# Patient Record
Sex: Male | Born: 1940 | Race: White | Hispanic: No | Marital: Single | Smoking: Never smoker
Health system: Southern US, Community
[De-identification: ages and names within clinical notes are randomized; demographics above are authoritative.]

## PROBLEM LIST (undated history)

## (undated) DIAGNOSIS — C61 Malignant neoplasm of prostate: Secondary | ICD-10-CM

## (undated) DIAGNOSIS — E785 Hyperlipidemia, unspecified: Secondary | ICD-10-CM

## (undated) DIAGNOSIS — F329 Major depressive disorder, single episode, unspecified: Secondary | ICD-10-CM

## (undated) DIAGNOSIS — C801 Malignant (primary) neoplasm, unspecified: Secondary | ICD-10-CM

## (undated) DIAGNOSIS — M1990 Unspecified osteoarthritis, unspecified site: Secondary | ICD-10-CM

## (undated) DIAGNOSIS — K4090 Unilateral inguinal hernia, without obstruction or gangrene, not specified as recurrent: Secondary | ICD-10-CM

## (undated) DIAGNOSIS — E119 Type 2 diabetes mellitus without complications: Secondary | ICD-10-CM

## (undated) DIAGNOSIS — C439 Malignant melanoma of skin, unspecified: Secondary | ICD-10-CM

## (undated) DIAGNOSIS — I10 Essential (primary) hypertension: Secondary | ICD-10-CM

## (undated) DIAGNOSIS — Z974 Presence of external hearing-aid: Secondary | ICD-10-CM

## (undated) DIAGNOSIS — F32A Depression, unspecified: Secondary | ICD-10-CM

## (undated) DIAGNOSIS — R001 Bradycardia, unspecified: Secondary | ICD-10-CM

## (undated) DIAGNOSIS — S68111A Complete traumatic metacarpophalangeal amputation of left index finger, initial encounter: Secondary | ICD-10-CM

## (undated) DIAGNOSIS — I493 Ventricular premature depolarization: Secondary | ICD-10-CM

## (undated) DIAGNOSIS — Q742 Other congenital malformations of lower limb(s), including pelvic girdle: Secondary | ICD-10-CM

## (undated) HISTORY — PX: OTHER SURGICAL HISTORY: SHX169

## (undated) HISTORY — DX: Malignant (primary) neoplasm, unspecified: C80.1

## (undated) HISTORY — PX: TOTAL HIP ARTHROPLASTY: SHX124

## (undated) HISTORY — DX: Hyperlipidemia, unspecified: E78.5

## (undated) HISTORY — DX: Bradycardia, unspecified: R00.1

## (undated) HISTORY — DX: Ventricular premature depolarization: I49.3

## (undated) HISTORY — DX: Type 2 diabetes mellitus without complications: E11.9

## (undated) HISTORY — PX: INGUINAL HERNIA REPAIR: SUR1180

---

## 2000-05-31 ENCOUNTER — Ambulatory Visit (HOSPITAL_COMMUNITY): Admission: RE | Admit: 2000-05-31 | Discharge: 2000-05-31 | Payer: Self-pay | Admitting: Dermatology

## 2002-06-16 ENCOUNTER — Ambulatory Visit (HOSPITAL_COMMUNITY): Admission: RE | Admit: 2002-06-16 | Discharge: 2002-06-17 | Payer: Self-pay | Admitting: Pediatrics

## 2003-04-27 ENCOUNTER — Ambulatory Visit (HOSPITAL_COMMUNITY): Admission: RE | Admit: 2003-04-27 | Discharge: 2003-04-27 | Payer: Self-pay | Admitting: Gastroenterology

## 2003-09-09 ENCOUNTER — Encounter: Payer: Self-pay | Admitting: General Surgery

## 2003-09-09 ENCOUNTER — Ambulatory Visit (HOSPITAL_COMMUNITY): Admission: RE | Admit: 2003-09-09 | Discharge: 2003-09-09 | Payer: Self-pay | Admitting: General Surgery

## 2003-09-28 ENCOUNTER — Ambulatory Visit (HOSPITAL_BASED_OUTPATIENT_CLINIC_OR_DEPARTMENT_OTHER): Admission: RE | Admit: 2003-09-28 | Discharge: 2003-09-28 | Payer: Self-pay | Admitting: Urology

## 2003-09-28 ENCOUNTER — Encounter (INDEPENDENT_AMBULATORY_CARE_PROVIDER_SITE_OTHER): Payer: Self-pay

## 2003-09-28 ENCOUNTER — Ambulatory Visit (HOSPITAL_COMMUNITY): Admission: RE | Admit: 2003-09-28 | Discharge: 2003-09-28 | Payer: Self-pay | Admitting: Urology

## 2003-10-11 ENCOUNTER — Ambulatory Visit (HOSPITAL_COMMUNITY): Admission: RE | Admit: 2003-10-11 | Discharge: 2003-10-11 | Payer: Self-pay | Admitting: Urology

## 2003-10-15 ENCOUNTER — Observation Stay (HOSPITAL_COMMUNITY): Admission: RE | Admit: 2003-10-15 | Discharge: 2003-10-16 | Payer: Self-pay | Admitting: General Surgery

## 2003-11-08 ENCOUNTER — Ambulatory Visit: Admission: RE | Admit: 2003-11-08 | Discharge: 2003-11-29 | Payer: Self-pay | Admitting: Radiation Oncology

## 2003-12-04 HISTORY — PX: PROSTATECTOMY: SHX69

## 2004-01-25 ENCOUNTER — Encounter (INDEPENDENT_AMBULATORY_CARE_PROVIDER_SITE_OTHER): Payer: Self-pay | Admitting: *Deleted

## 2004-01-25 ENCOUNTER — Inpatient Hospital Stay (HOSPITAL_COMMUNITY): Admission: RE | Admit: 2004-01-25 | Discharge: 2004-01-29 | Payer: Self-pay | Admitting: Urology

## 2004-12-07 ENCOUNTER — Ambulatory Visit (HOSPITAL_COMMUNITY): Admission: RE | Admit: 2004-12-07 | Discharge: 2004-12-07 | Payer: Self-pay | Admitting: Family Medicine

## 2004-12-18 ENCOUNTER — Ambulatory Visit (HOSPITAL_COMMUNITY): Admission: RE | Admit: 2004-12-18 | Discharge: 2004-12-18 | Payer: Self-pay | Admitting: Family Medicine

## 2005-10-31 ENCOUNTER — Ambulatory Visit (HOSPITAL_COMMUNITY): Admission: RE | Admit: 2005-10-31 | Discharge: 2005-10-31 | Payer: Self-pay | Admitting: General Surgery

## 2005-12-13 ENCOUNTER — Ambulatory Visit: Admission: RE | Admit: 2005-12-13 | Discharge: 2006-01-10 | Payer: Self-pay | Admitting: Radiation Oncology

## 2006-01-18 ENCOUNTER — Ambulatory Visit (HOSPITAL_COMMUNITY): Admission: RE | Admit: 2006-01-18 | Discharge: 2006-01-18 | Payer: Self-pay | Admitting: Family Medicine

## 2006-08-21 ENCOUNTER — Encounter: Payer: Self-pay | Admitting: Vascular Surgery

## 2006-08-21 ENCOUNTER — Ambulatory Visit (HOSPITAL_COMMUNITY): Admission: RE | Admit: 2006-08-21 | Discharge: 2006-08-21 | Payer: Self-pay | Admitting: Family Medicine

## 2007-09-08 ENCOUNTER — Inpatient Hospital Stay (HOSPITAL_COMMUNITY): Admission: RE | Admit: 2007-09-08 | Discharge: 2007-09-11 | Payer: Self-pay | Admitting: Orthopedic Surgery

## 2007-11-03 ENCOUNTER — Ambulatory Visit (HOSPITAL_COMMUNITY): Admission: RE | Admit: 2007-11-03 | Discharge: 2007-11-04 | Payer: Self-pay | Admitting: General Surgery

## 2008-01-20 ENCOUNTER — Ambulatory Visit (HOSPITAL_COMMUNITY): Admission: RE | Admit: 2008-01-20 | Discharge: 2008-01-20 | Payer: Self-pay | Admitting: Surgery

## 2008-06-23 ENCOUNTER — Inpatient Hospital Stay (HOSPITAL_COMMUNITY): Admission: RE | Admit: 2008-06-23 | Discharge: 2008-06-28 | Payer: Self-pay | Admitting: Orthopedic Surgery

## 2010-12-23 ENCOUNTER — Encounter: Payer: Self-pay | Admitting: Urology

## 2011-04-17 NOTE — Op Note (Signed)
NAME:  Jay Bailey, FERRELL NO.:  [id number]   MEDICAL RECORD NO.:  [id number]          PATIENT TYPE:  INP   LOCATION:  0006                         FACILITY:  [facility]   PHYSICIAN:  Ollen Gross, M.D.    DATE OF BIRTH:  1941/10/03   DATE OF PROCEDURE:  09/08/2007  DATE OF DISCHARGE:                               OPERATIVE REPORT   PREOPERATIVE DIAGNOSIS:  Osteoarthritis right hip.   POSTOPERATIVE DIAGNOSIS:  Osteoarthritis right hip.   PROCEDURE:  Right total hip arthroplasty.   SURGEON:  Ollen Gross, M.D.   ASSISTANT:  Avel Peace PA-C.   ANESTHESIA:  General.   ESTIMATED BLOOD LOSS:  300 mL.   DRAINS:  None.   COMPLICATION:  None.   CONDITION:  Stable to recovery.   CLINICAL NOTE:  The patient is a 70 year old male with severe end-stage  arthritis of both hips with progressively worsening pain and  dysfunction.  He has had a difficult time even ambulating now.  Right  hip is more painful than the left.  He presents now for right total hip  arthroplasty.   PROCEDURE IN DETAIL:  After the successful administration of general  anesthetic, the patient placed in left lateral decubitus position with  the right side up and held with the hip positioner.  Right lower  extremity is isolated from perineum with plastic drapes and prepped and  draped in the usual sterile fashion.  Short posterolateral incision was  made with a 10 blade through subcutaneous tissue to the level of the  fascia lata which is incised in line with the skin incision.  Sciatic  nerve is palpated and protected and the short external rotator is  isolated off the femur.  Capsulectomy is performed and the hip is  dislocated.  The center of the femoral head is marked and a trial  prosthesis placed such that the center of the trial head corresponds to  the center of his native femoral head.  Osteotomy lines marked on the  femoral neck and osteotomy made with an oscillating saw.  Femoral head  is removed and the femur retracted anteriorly to gain acetabular  exposure.  He had a massive rim of osteophyte around the entire  acetabulum, which is about an inch.  The osteophyte is removed and  retractor is placed.  Reaming started at 47 mm coursing increments of 2  up to 53 mm and a 54 mm pinnacle acetabular shell is placed in anatomic  position and transfixed with two dome screws.  The apex hole eliminator  and the 36 mm neutral Ultramet metal liner is placed.   On the femoral side, we prepared with the canal finder and irrigation.  Axial reaming is performed up to 13.5 mm, proximal reaming to a 8F and  the sleeve machine to extra-extra large.  With this sleeve, it sat proud  a few millimeters but we did this because the large sunk below the  resection level and I was concerned about it subsiding.  With the sleeve  sitting proud a few millimeters, we had a tighter fit that would  not  subside.  The 18 x 13 the stem with 36 +8 neck is then placed.  I  anteverted about 10 degrees beyond her native anatomy.  The trial 36 +0  head is placed and introduced a great stability.  There is full  extension, full external rotation, 70 degrees of flexion, 40 degrees  adduction, 90 degrees internal rotation 90 degrees of flexion and 70  degrees of internal rotation.  I placed the right leg on top of the left  and felt as though leg lengths were just about equal.  Wound is then  copiously irrigated saline solution and the short external rotator is  reattached to the femur through drill holes.  Fascia lata is closed with  interrupted #1 Vicryl, subcu closed with #1 and 2-0 Vicryl subcuticular  running 4-0 Monocryl.  The incision is cleaned and dried and Steri-  Strips and a bulky sterile dressing applied.  He is then awakened and  transferred to recovery in stable condition.      Ollen Gross, M.D.  Electronically Signed     FA/MEDQ  D:  09/08/2007  T:  09/08/2007  Job:  [id number]

## 2011-04-17 NOTE — Op Note (Signed)
NAME:  Jay Bailey, Jay Bailey NO.:  [id number]   MEDICAL RECORD NO.:  [id number]          PATIENT TYPE:  OIB   LOCATION:  2550                         FACILITY:  [facility]   PHYSICIAN:  Leonie Man, M.D.   DATE OF BIRTH:  1941-03-15   DATE OF PROCEDURE:  11/03/2007  DATE OF DISCHARGE:                               OPERATIVE REPORT   PREOPERATIVE DIAGNOSIS:  Recurrent left inguinal hernia   POSTOPERATIVE DIAGNOSIS:  Left femoral hernia.   PROCEDURE:  Left groin exploration and repair of left femoral hernia.   SURGEON:  Leonie Man, M.D.   ASSISTANT:  OR nurse.   ANESTHESIA:  General.   The patient is a 70 year old man status post a bilateral inguinal hernia  repairs.  His left-sided inguinal hernia was repaired once in the remote  past by a McVay type repair and then again by me several years ago with  a Cytogeneticist.  He presents now what was felt to be a left-sided  recurrent hernia with pain.  This was reduced and the patient comes to  the operating room now for repair of his left-sided groin hernia.  The  risks and potential benefits of surgery have been fully discussed with  him and he was aware of the possibility that we may have to remove his  testicle.  As it turns out, this was not necessary.   PROCEDURE:  The patient positioned supinely following the induction of  satisfactory general anesthesia, the left groin and scrotum were prepped  and draped to be included in a sterile operative field.  Transverse  incision is carried down through the old left groin scar cicatrix,  deepened through skin and subcutaneous tissues carried down to the  region of the external oblique aponeurosis which was opened.  The  previous hernia repair with mesh was completely intact and there was no  evidence of a hernia.  Dissection was carried down on the medial side of  the inguinal canal where I encountered a large femoral hernia.  This was  dissected free and  released.  I used a large polypropylene plug to plug  the defect and sewed this in with 0 Novofil sutures to the pecten pubis,  the femoral sheath and the inguinal ring with multiple sutures of 0  Novofil.  The repair appeared to be intact.  Again, reinspection of the  inguinal space was intact with no evidence of hernia there.  Sponge,  instrument and sharp counts were then verified.  The external oblique  aponeurosis closed with interrupted 0 Prolene sutures.  Subcutaneous  tissues closed with 2-0 Vicryl.  Skin closed with 4-0 Monocryl and  reinforced with Steri-Strips.  Sterile dressings applied.  Anesthetic  reversed.  The patient removed from the operating room to the recovery  room in stable condition.  He tolerated the procedure well.      Leonie Man, M.D.  Electronically Signed    PB/MEDQ  D:  11/03/2007  T:  11/03/2007  Job:  [id number]

## 2011-04-17 NOTE — Discharge Summary (Signed)
NAME:  Jay Bailey, Jay Bailey NO.:  [id number]   MEDICAL RECORD NO.:  [id number]          PATIENT TYPE:  INP   LOCATION:  1531                         FACILITY:  [facility]   PHYSICIAN:  Ollen Gross, M.D.    DATE OF BIRTH:  1941-05-18   DATE OF ADMISSION:  06/23/2008  DATE OF DISCHARGE:  06/28/2008                               DISCHARGE SUMMARY   ADMITTING DIAGNOSES:  1. Osteoarthritis left hip.  2. Hypertension.  3. Chronic pain.  4. Hypercholesterolemia.  5. Overactive bladder.  6. History of depression.  7. History of prostate cancer.   DISCHARGE DIAGNOSES:  1. Osteoarthritis left hip status post left total hip replacement      arthroplasty.  2. Postop acute blood loss anemia.  3. Hypertension.  4. Chronic pain.  5. Hypercholesterolemia.  6. Overactive bladder.  7. History of depression.  8. History of prostate cancer.   PROCEDURES:  June 23, 2008 left total hip.  Surgeon Dr. Lequita Halt,  assistant Avel Peace, P.A.C.  Anesthesia:  General.   CONSULTS:  None.   BRIEF HISTORY:  [name] is a 70 year old male who has end-stage arthritis  of the left hip, progressive worsening pain and dysfunction, previous  successful right total hip now who presents for his left total hip.   LABORATORY DATA:  Preop CBC showed hemoglobin 16.1, hematocrit 48.7,  white cell count 6.5, platelets 177.  Chem panel showed elevated  potassium of 5.8, elevated sodium 146.  Remaining chem panel all within  normal limits.  PT/INR 13.3 and 1 with PTT of 28.  Preop UA was negative  except for trace of ketones.  Serial CBCs were followed.  Hemoglobin did  drop down to 10.6 and 9.3.  Last known H&H of 9.1 and 27.  Serial  protimes were followed per Coumadin protocol.  Last known PT/INR 26.9  and 2.3.  Serial BMETs are followed.  The elevated sodium came down to a  normal level of 136.  The high potassium level came down to 5.2; last  noted a normal level of 4.2.  Remaining electrolytes were  within normal  limits.   X-rays left hip films June 17, 2008:  Osteopenic appearance of bones,  severe osteoarthritis involving the left hip.   EKG June 24, 2008:  Normal sinus rhythm, nonspecific T-wave  abnormalities compared to the EKG of September 01, 2007 confirmed by Dr.  Lynnea Ferrier.   HOSPITAL COURSE:  The patient admitted to [facility].  Tolerated procedure well.  Later transferred to the recovery room of the  orthopedic floor.  Started on PCA and p.o. analgesic pain control  following surgery.  Did pretty well on the evening of surgeryand  in the  morning of day #1.  Ran a little low pressures through the night.  Had  to receive some fluid boluses, but was asymptomatic with the mild  hypotension.  Continued to run a little low pressure on day #1.  Felt to  be due to the PCA.  Withheld his Benicar.  Resumed his other home meds.  Again his symptoms were asymptomatic.  He  had a slight increase in his  creatinine from 1 to 1.5 probably due to his low pressures.  We  continued fluids through that day with increased rate and 1 or 2  boluses.  His pressure did stabilize and came back up by the next  morning.  His pressure was improving.  Hemoglobin unfortunately was down  a little bit.  Some of it was felt to be due to blood loss and some felt  to be due to some dilutional component with the fluids.  His  electrolytes, sodium and potassium, were elevated on admission, probably  somewhat concentrated, were now back down to normal levels by postop day  #2.  Dressing was changed.  Incision looked good.  The patient did go to  a nursing home rehab facility following his previous hip and wanted to  do that again since he did not have any help at home.  We got discharge  planning involved.  He stayed in the hospital through the weekend to  receive continued care.  Was slowly progressing, only walking about 10  to 20 feet through the weekend.  Wound continued to heal well.   Hemoglobin went down to 9.1but he was asymptomatic with this, and  started increasing up to about 40 feet.  Did well through the weekend,  and on June 28, 2008 he was seen back on morning rounds on Monday.  It  was noted that patient chose a bed over at [facility].  We had  confirmed the availability of that bed, and since the patient was stable  he was transferred over at that time.   DISCHARGE/PLAN:  1. The patient will be transferred over to [facility] nursing rehab      facility on June 28, 2008.  2. Discharge diagnoses, please see above  3. Discharge meds.   CURRENT MEDICATIONS:  1. Lipitor 80 mg p.o. q.h.s.  2. Benicar 40 mg daily.  3. Wellbutrin 300 mg XL p.o. daily.  4. Vesicare 10 mg daily.  5. He is on Coumadin protocol.  Please titrate the Coumadin level for      a target INR between 2 and 3.  He needs to be on Coumadin for 3      weeks from the date of surgery of June 23, 2008.  6. Colace 100 mg p.o. b.i.d.  7. Nu-Iron 150 mg p.o. daily for 3 weeks from discharge.  8. MiraLax 17 g in 8 oz of water p.o. b.i.d. p.r.n.  9. Tramadol 50 mg p.o. q.h.s. p.r.n.  10.Percocet 5 one or two every 4 hours as needed for pain.  11.Tylenol 325 one or two every 4-6 hours as needed for mild pain,      temporal headache. 12. Laxative of choice, enema of choice.  12.Robaxin 500 mg p.o. q.6-8 h. p.r.n. spasm.  13.Restoril 15 mg to 30 mg p.o. q.h.s. p.r.n. sleep.   DIET:  Heart-healthy diet.   ACTIVITY:  He is partial weightbearing 25-50% to the left lower  extremity.  PT and OT for gait training, ambulation, ADLs.  For the next  several days since the patient is not fully independent, he will need  some assistance for the next several days with assistance of transfers  to the bed and chair and also to the bathroom.  Please be aware the  patient will need a little bit more assistance the first couple of days  since he not quite independent, but continue gait training, ambulation,   ADLs.  Again  maintain his partial weightbearing at 25-50%.   FOLLOWUP:  He needs to follow up with Dr. Lequita Halt in the office about 2  weeks from date of surgery.  Please assist in arranging appointment time  and transfer the patient over to the [place] office of  [facility] to see Dr. Lequita Halt.   DISPOSITION:  [facility] nursing rehab center.   CONDITION ON DISCHARGE:  Improving.      Alexzandrew L. Perkins, P.A.C.      Ollen Gross, M.D.  Electronically Signed    ALP/MEDQ  D:  06/28/2008  T:  06/28/2008  Job:  [id number]   cc:   Skilled nursing facility of choice  (appears to be [facility])

## 2011-04-17 NOTE — Discharge Summary (Signed)
NAME:  Jay Bailey, Jay Bailey NO.:  [id number]   MEDICAL RECORD NO.:  [id number]          PATIENT TYPE:  INP   LOCATION:  1601                         FACILITY:  [facility]   PHYSICIAN:  Ollen Gross, M.D.    DATE OF BIRTH:  1941/11/09   DATE OF ADMISSION:  09/08/2007  DATE OF DISCHARGE:  09/11/2007                               DISCHARGE SUMMARY   ADMITTING DIAGNOSES:  1. Osteoarthritis, right hip.  2. Hypertension.  3. Chronic pain.  4. Hypercholesterolemia.  5. Overactive bladder.  6. History of depression.  7. History of prostate cancer.   DISCHARGE DIAGNOSES:  1. Osteoarthritis of right hip, status post right total hip      arthroplasty.  2. Mild postoperative blood loss anemia, did not require transfusion.  3. Hypertension.  4. Chronic pain.  5. Hypercholesterolemia.  6. Overactive bladder.  7. History of depression.  8. History of prostate cancer.   PROCEDURE:  September 08, 2007, right total hip.   SURGEON:  Dr. Lequita Halt.   ASSISTANT:  Avel Peace, PA-C.   ANESTHESIA:  General.   CONSULTS:  None.   BRIEF HISTORY:  The patient is a 70 year old male with severe end-stage  arthritis of the hips with progressive worsening pain and dysfunction,  difficult time ambulating; right hip is more painful and now presents  for a total hip arthroplasty.   LABORATORY DATA:  Preop CBC showed a hemoglobin of 16, hematocrit of  47.1, white cell count 8.4.  Chemistry panel on admission:  Minimally  elevated glucose at 111, remaining chemistry panel all within normal  limits.  PT/INR preop 13.3 and 1 with a PTT of 30.  Preop UA negative.  Serial CBCs were followed.  Hemoglobin dropped down to 11.2, then to  10.9, last noted H&H 10 and 28.9.  Serial CBCs were followed.  Electrolytes remained within normal limits.  Serial pro times were  followed; last noted PT/INR were 26.3 and 2.3.   X-RAYS:  Pelvis and hip film on September 08, 2007:  Expected postoperative  repair,  status post right total hip.   Preop chest x-ray of September 01, 2007:  No acute cardiopulmonary  disease.   Preop hip films:  Bilateral advanced degenerative changes of the right  hip, right greater than left, as per preop.   EKG, September 01, 2007:  Normal sinus rhythm.  When compared, no  change, confirmed by Dr. Lewayne Bunting.   HOSPITAL COURSE:  The patient was admitted to [facility],  tolerated the procedure well, later transferred to the recovery room and  orthopedic floor and started on PCA and p.o. analgesic for pain control  following surgery, given 24 hours of postop IV antibiotics and started  on Coumadin for DVT prophylaxis, started out partial weightbearing and  then started getting up out of bed with therapy.  He had been running  somewhat low pressures in the evening of surgery and the morning of day  #1; it was felt to be due to the anesthesia and the IV narcotics; we  held his blood pressure medications and put his Diovan  on blood pressure  parameters.  Due to his living situation and testing the patient with  his progress with therapy, it was felt that he may need some type of  skilled nursing facility; Discharge Planning was consulted postop to  assist with arrangements.  Family wanted to look into [facility].  He  was started back on his home medications and started getting up out of  bed on day #1.  By day #2, he was doing a little bit better.  Pain was  under a little bit better control and he was weaned over p.o.  medications.  He started to get up, walking short distances.  Dressing  was changed on day #2; incision looked good, continued to progress well  and by day #3, the patient was doing well, incision was healing,  tolerating his medications.  There was indication there may be a bed  available at [facility] later that day.  He was seen on rounds and  arrangements were being made for possible discharge later that  afternoon.   DISCHARGE  PLAN:  1. Tentative plan for transfer to [facility] on September 11, 2007.  2. Discharge diagnoses:  Please see above.  3. Discharge medications:  Current medications include:      a.     Colace 100 mg p.o. b.i.d.      b.     Diovan 160 mg p.o. daily; we were holding for systolic       pressures less than 140.      c.     Vesicare 10 mg p.o. q.a.m.      d.     Wellbutrin XL 300 mg p.o. daily.      e.     MiraLax 17 g in 8 ounces of water p.o. nightly.      f.     Lipitor 80 mg p.o. nightly.      g.     He is on Coumadin protocol; please titrate the Coumadin       level for target INR between 2 and 3 and he needs to be on       Coumadin for 3 weeks from the date of surgery, which is September 08, 2007.      h.     Percocet 5 mg one or two every 4-6 hours as needed for pain.      i.     Tylenol 325 mg one or two every 4-6 hours as need for mild       pain, temperature or headache.      j.     Robaxin 500 mg p.o. q.6-8 h. p.r.n. spasm.      k.     He is also taking Restoril 15 to 30 mg p.o. nightly p.r.n.       sleep in the hospital.   DIET:  Low-sodium, heart-healthy diet.   ACTIVITY:  We have advanced him to weightbearing as tolerated to the  right lower extremity, hip precautions and total hip protocol.  He needs  to be up out of bed a minimum of b.i.d., ambulate, gait training  ambulation and ADLs as per PT and OT.  He may start showering, however,  do not submerge the incision under water.  He needs a daily dressing  change to the hip.   FOLLOWUP:  Follow up with Dr. Lequita Halt in the office 2 weeks from surgery  at the [place] [facility] of [facility];  please contact  the office at [phone] to arrange an appointment time and transfer for  this patient.   DISPOSITION:  Pending possible bed at [facility] today.   CONDITION UPON DISCHARGE:  Improving.      Alexzandrew L. Perkins, P.A.C.      Ollen Gross, M.D.  Electronically Signed    ALP/MEDQ   D:  09/11/2007  T:  09/11/2007  Job:  [id number]   cc:   Stacie Acres. Cliffton Asters, M.D.  Fax: [phone]

## 2011-04-17 NOTE — Op Note (Signed)
NAME:  Jay Bailey, Jay Bailey NO.:  [id number]   MEDICAL RECORD NO.:  [id number]          PATIENT TYPE:  INP   LOCATION:  1531                         FACILITY:  [facility]   PHYSICIAN:  Ollen Gross, M.D.    DATE OF BIRTH:  [birth year]   DATE OF PROCEDURE:  06/23/2008  DATE OF DISCHARGE:                               OPERATIVE REPORT   PREOPERATIVE DIAGNOSIS:  Osteoarthritis left hip.   POSTOPERATIVE DIAGNOSIS:  Osteoarthritis left hip.   PROCEDURE:  Left total hip arthroplasty.   SURGEON:  Ollen Gross, M.D.   ASSISTANT:  Avel Peace, P.A.-C.   ANESTHESIA:  General.   ESTIMATED BLOOD LOSS:  500.   DRAINS:  Hemovac x1.   COMPLICATIONS:  None.   CONDITION:  Stable to recovery.   BRIEF CLINICAL NOTE:  Jay Bailey is a 70 year old male who has end-stage  arthritis of the left hip, progressively worsening pain and dysfunction.  He has had a previous successful right total hip arthroplasty and  presents now for left total hip arthroplasty.   PROCEDURE IN DETAIL:  After successful initiation of general anesthetic,  the patient was placed in the right lateral decubitus position with the  left side up and held with the hip positioner.  The left lower extremity  was isolated from his perineum with plastic drapes and prepped and  draped in the usual sterile fashion.  Short posterolateral incision was  made with a 10 blade through subcutaneous tissue to the level of fascia  lata was incised in line with the skin incision.  The sciatic nerve was  palpated and protected and short external rotators isolated off the  femur.  Capsulectomy was performed, and the hip was dislocated.  Center  of femoral head was marked and a trial prosthesis was placed such that  the center of the trial head corresponded to the center of native  femoral head.  Osteotomy lines marked on femoral neck and osteotomy made  with an oscillating saw.  Femoral head was removed, and the femur was  retracted  anteriorly to gain acetabular exposure.   After the acetabular retractors were placed and labrum and large rim of  osteophytes removed, the acetabular reaming started at 45 mm coursing in  increments of 2-53 mm, and then a 54-mm Pinnacle acetabular shell was  placed in anatomic position and transfixed with 2 dome screws with  excellent purchase.  The apex hole eliminator was placed, and the 36-mm  neutral Ultamet metal liner was placed.  It was a metal-on-metal hip  replacement.   Femur was prepared the canal finder and irrigation.  Axial reaming was  performed to 13.5 mm, proximal reaming to an 1F and sleeve machined to  a large.  An 1F large trial sleeve was placed, an 18 x 13 stem and a 36  plus 8 neck.  A 36 plus 0 trial head was placed.  The hip reduced easily  and needs more offset.  A 36 plus 12 neck was placed matching native  anteversion.  A 36.0 head was placed.  The hip was reduced with  excellent  stability.  There was full extension, full external rotation,  70 degrees flexion, 40 degrees adduction, 90 degrees internal rotation,  90 degrees flexion, 70 degrees of internal rotation.  By placing the  left leg on top of the right, it was a couple millimeters short, so I  went with 36 plus 3 head which neutralized this.   Trials were removed, and the permanent 87F large sleeve was placed, 18 x  13 stem, 36 plus 12 neck matching native anteversion.  A 36 plus 3 head  was placed, and the hip was reduced with the same stability parameters.  Wound was copiously irrigated with saline solution.  Short rotators  reattached to the femur through drill holes.  Fascia lata was closed  over a Hemovac drain with interrupted #1 Vicryl, subcutaneous closed  with #1 and 2-0 Vicryl and subcuticular running 4-0 Monocryl.  Drain was  hooked to suction.  Incision cleaned and dried, and Steri-Strips and  bulky sterile dressing were applied.  He was placed into a knee  immobilizer, awakened, and  transported to recovery in stable condition.      Ollen Gross, M.D.  Electronically Signed     FA/MEDQ  D:  06/23/2008  T:  06/23/2008  Job:  [id number]

## 2011-04-17 NOTE — H&P (Signed)
NAME:  Jay Bailey, WORTH NO.:  [id number]   MEDICAL RECORD NO.:  [id number]          PATIENT TYPE:  INP   LOCATION:  NA                           FACILITY:  [facility]   PHYSICIAN:  Ollen Gross, M.D.    DATE OF BIRTH:  1941/06/16   DATE OF ADMISSION:  09/08/2007  DATE OF DISCHARGE:                              HISTORY & PHYSICAL   DATE OF OFFICE VISIT/HISTORY AND PHYSICAL:  August 28, 2007   CHIEF COMPLAINT:  Right hip pain greater than left hip pain.   HISTORY OF PRESENT ILLNESS:  The patient is 70 year old male who has  been was seen by Dr. Lequita Halt for bilateral hip pain for some time now.  He is found to have known arthritis with bone-on-bone in the left hip  and severe end-stage arthritis with bone-on-bone in the right hip with  flattening of the femoral head.  It has been progressive in nature and  has reached a point where he would benefit undergoing surgical  intervention.  Risks and benefits have been discussed, and he elects to  proceed with surgery.   ALLERGIES:  No known drug allergies.   CURRENT MEDICATIONS:  Lipitor, Vesicare, Wellbutrin vitamins, aspirin,  Aleve, and a medication that I am unable to read,   PAST MEDICAL HISTORY:  1. Hypertension.  2  Chronic pain.  1. Hypercholesterolemia.  2. Overactive bladder.  3. Depression.  4. Prostate cancer.   SOCIAL HISTORY:  Single, nonsmoker.  No alcohol.  No children.   FAMILY HISTORY:  Renal cancer, lung cancer, colon cancer.   PAST SURGICAL HISTORY:  1. Prostate surgery.  2. Hernia surgery.   REVIEW OF SYSTEMS:  GENERAL:  No fevers, chills, night sweats.  NEURO:  No seizures, syncope, or paralysis.  RESPIRATORY:  No shortness of  breath, productive cough, or hemoptysis.  CARDIOVASCULAR:  No chest  pain, orthopnea.  GI:  No nausea, diarrhea, or constipation.  GU:  No  dysuria, hemorrhages, or discharge.  MUSCULOSKELETAL:  Right hip.   PHYSICAL EXAM:  VITAL SIGNS:  Pulse 64, respirations  12, blood pressure  142/78.  GENERAL:  A 70 year old white male well-nourished, well-developed, in no  acute distress.  He is alert and cooperative.  HEENT: Normocephalic, atraumatic.  Pupils equal, round, and reactive.  Oropharynx clear.  EOMs intact.  NECK:  Supple.  CHEST:  Clear anterior and posterior chest walls.  HEART:  Regular rate and rhythm.  No murmur.  ABDOMEN:  Soft, nontender.  Bowel sounds present.  RECTAL BREASTS AND GENITALIA:  Not done not pertinent to present  illness.  EXTREMITIES:  Right hip with flexion of 70 degrees, 0 internal rotation,  0 external rotation, 10 degrees of external rotation contracture.  Left  hip with flexion 90, external rotation 0, internal rotation 10 degrees  abduction.   IMPRESSION:  1. Osteoarthritis right hip.  2. Hypertension.  3. Chronic pain.  4. Hypercholesterolemia.  5. Overactive bladder  6. History of depression.  7. History of prostate cancer.   PLAN:  The patient admitted to [facility] to undergo right  total  replacement arthroplasty.  Surgery will be performed by Dr. Ollen Gross.  He does not have anyone at home to help him, so he definitely  wants to look into some type of rehab or skilled nursing facility  postoperatively.      Alexzandrew L. Perkins, P.A.C.      Ollen Gross, M.D.  Electronically Signed    ALP/MEDQ  D:  09/07/2007  T:  09/08/2007  Job:  [id number]   cc:   Stacie Acres. Cliffton Asters, M.D.  Fax: [phone]   Ollen Gross, M.D.  Fax: [phone]

## 2011-04-17 NOTE — H&P (Signed)
NAME:  [name], [name] NO.:  [id number]   MEDICAL RECORD NO.:  [id number]          PATIENT TYPE:  INP   LOCATION:  1531                         FACILITY:  [facility]   PHYSICIAN:  Ollen Gross, M.D.    DATE OF BIRTH:  04-24-1941   DATE OF ADMISSION:  06/23/2008  DATE OF DISCHARGE:                              HISTORY & PHYSICAL   CHIEF COMPLAINT:  Left hip pain.   HISTORY OF PRESENT ILLNESS:  The patient is a 70 year old male well-  known to Dr. Ollen Gross, having previously undergone a right total  hip.  Unfortunately, in the left hip he has known end-stage arthritis  and continues to have pain.  It is felt he would benefit from undergoing  a left total hip.  Risks and benefits discussed.  The patient  subsequently admitted to the hospital.   ALLERGIES:  No known drug allergies.   CURRENT MEDICATIONS:  He is on depression pill, Vesicare, Benicar,  vitamin C, vitamin E, folic acid, vitamin A, vitamin D, Theragran  vitamin, Tylenol Arthritis and a pain pill.   PAST MEDICAL HISTORY:  1. Hypertension.  2. Chronic pain.  3. Hypercholesterolemia.  4. Overactive bladder.  5. History depression.  6. History of prostate cancer.   PAST SURGICAL HISTORY:  He has had a right total hip done.  Prostate  surgery, multiple hernia repairs.   SOCIAL HISTORY:  Single, nonsmoker.  No alcohol.   FAMILY HISTORY:  Colon cancer, lung cancer, arthritis.   REVIEW OF SYSTEMS:  GENERAL:  No fevers, chills, night sweats.  NEURO:  No seizures, syncope or paralysis.  RESPIRATORY:  No shortness of  breath, productive cough or hemoptysis.  CARDIOVASCULAR:  No chest pain,  angina or orthopnea.  GI: No nausea, vomiting, diarrhea or constipation.  GU:  No dysuria, hematuria or discharge.  MUSCULOSKELETAL:  Left hip.   PHYSICAL EXAMINATION:  VITAL SIGNS:  Pulse 68, respirations 14, blood  pressure 128/64.  GENERAL:  A 70 year old white male, well-nourished, well-developed, in  no  acute distress, somewhat anxious.  HEENT:  Normocephalic, atraumatic.  Pupils equal, round and reactive.  EOMs intact.  NECK:  Supple.  CHEST:  Clear.  HEART:  Regular rate and rhythm.  No murmur.  S1, S2 noted.  ABDOMEN:  Soft, nontender.  Bowel sounds present.  RECTAL, BREASTS, GENITALIA:  Not done, not pertinent to present illness.  EXTREMITIES:  Left hip flexion 80, 0 internal rotation, 0 external  rotation, 0 degrees abduction, antalgic gait.   IMPRESSION:  Osteoarthritis, left hip.   PLAN:  The patient admitted to [facility] to undergo a left  total hip replacement arthroplasty.  Surgery will be performed by Dr.  Ollen Gross.      Alexzandrew L. Perkins, P.A.C.      Ollen Gross, M.D.  Electronically Signed    ALP/MEDQ  D:  06/27/2008  T:  06/27/2008  Job:  [id number]

## 2011-04-20 NOTE — Op Note (Signed)
   NAME:  BUSH, MURDOCH                          ACCOUNT NO.:  [id number]   MEDICAL RECORD NO.:  [id number]                   PATIENT TYPE:  AMB   LOCATION:  ENDO                                 FACILITY:  [facility]   PHYSICIAN:  James L. Malon Kindle., M.D.          DATE OF BIRTH:  06/19/1941   DATE OF PROCEDURE:  04/27/2003  DATE OF DISCHARGE:                                 OPERATIVE REPORT   PROCEDURE:  Colonoscopy.   MEDICATIONS:  Fentanyl 125 mcg, Versed 12 mg IV.   SCOPE:  Adult video colonoscope.   INDICATIONS:  Strong family history of colon cancer.  Mother died of colon  cancer.   DESCRIPTION OF PROCEDURE:  The procedure had been explained to the patient  in the office and consent obtained.  Left lateral decubitus position.  Digital exam.  The patient was really quite anxious and did require a fair  amount of sedation.  The adult scope was used.  She did have fairly marked  diverticular disease and took some time to pass through this area but  __________ cecum.  The cecum was somewhat dirty and was not irrigated  completely with good visualization of the ileocecal valve and appendiceal  orifice.  The scope was withdrawn and the cecum, ascending colon, transverse  colon, descending colon were seen well and were free of polyps or other  lesions.  Again moderate diverticulosis was seen in the sigmoid colon.  Multiple large diverticula.  No polyps were seen.  No actual diverticulitis.  The scope was withdrawn down to the rectum.  The rectum was free of polyps.  The patient tolerated the procedure well.  There were no immediate  complications.   ASSESSMENT:  1. Moderate diverticulosis.  2. Family history of colon cancer.   PLAN:  Will recommend yearly hemoccults and repeat procedure in five years.                                                James L. Malon Kindle., M.D.    Waldron Session  D:  04/27/2003  T:  04/27/2003  Job:  [id number]   cc:   Molly Maduro A. Nicholos Johns, M.D.  [address]  [place]  [place] [address]  Fax: [phone]

## 2011-04-20 NOTE — Op Note (Signed)
NAME:  Jay Bailey, Jay Bailey                          ACCOUNT NO.:  [id number]   MEDICAL RECORD NO.:  [id number]                   PATIENT TYPE:  OBV   LOCATION:  0365                                 FACILITY:  [facility]   PHYSICIAN:  Leonie Man, M.D.                DATE OF BIRTH:  04-Sep-1941   DATE OF PROCEDURE:  10/15/2003  DATE OF DISCHARGE:                                 OPERATIVE REPORT   PREOPERATIVE DIAGNOSIS:  Right inguinal hernia.   POSTOPERATIVE DIAGNOSIS:  Right inguinal hernia.   OPERATION/PROCEDURE:  Right inguinal herniorrhaphy with mesh.   SURGEON:  Leonie Man, M.D.   ASSISTANT:  Nurse.   ANESTHESIA:  General.   INDICATIONS:  The patient is a 70 year old man with right groin bulge that  he discovered  on self-examination.  This has been causing him some  discomfort but without any evidence of bowel obstructive symptoms.  He has  no history of bladder neck obstruction or chronic constipation.  He does  have a diagnosis of carcinoma of the prostate for which he is being followed  by Dr. Darvin Neighbours and is scheduled to have a radical prostatectomy in the  near future.  He comes to the operating room now after the risks and  potential benefits of surgery have been fully discussed with him and with  the rest of his family and they gave consent for surgery.   DESCRIPTION OF PROCEDURE:  Following the induction of satisfactory general  anesthesia, the patient was positioned supine.  The lower abdomen is prepped  and draped to be included in the sterile operative field.  A transverse  incision is carried into the lower abdominal crease through the skin and  subcutaneous tissue, dissecting down to the external oblique aponeurosis.  The external oblique aponeurosis was opened down to the external inguinal  ring with protection of the ilioinguinal nerve.  Spermatic cord was elevated  and held with single strand.  A very large direct hernia is noted and  dissected free from  the spermatic cord.  There was no indirect hernia noted.  The repair was then carried out by an on-lay patch of Pro lite mesh which  was sewn in at the pubic tubercle, carried up along the shelving edge of  Poupart's ligament to the internal ring and again from the pubic tubercle up  along the conjoined tendon to the internal ring.  The mesh is split enough  to allow protrusion of the spermatic cord.  Repairs in the mesh were trimmed  and sewn down behind the spermatic cord, this closing off the internal ring.  All layers of the dissection were checked for hemostasis and found to be  dry, sponge and instruments counts verified.  External oblique aponeurosis  were closed over the cord with a running 2-0 Vicryl suture.  Scarpa's fascia  and subcutaneous fat closed with a running 3-0 Vicryl suture and  the skin  was closed with a running 4-0 Monocryl suture and then reinforced with Steri-  Strips.  Sterile dressing was applied, the anesthetic reversed, the patient  removed from the operating room to the recovery room in stable condition.  He tolerated the procedure well.                                               Leonie Man, M.D.   PB/MEDQ  D:  10/15/2003  T:  10/15/2003  Job:  [id number]

## 2011-04-20 NOTE — Discharge Summary (Signed)
NAME:  Jay Bailey, Jay Bailey                          ACCOUNT NO.:  [id number]   MEDICAL RECORD NO.:  [id number]                   PATIENT TYPE:  INP   LOCATION:  0368                                 FACILITY:  [facility]   PHYSICIAN:  Lucrezia Starch. Ovidio Hanger, M.D.           DATE OF BIRTH:  Jun 13, 1941   DATE OF ADMISSION:  01/25/2004  DATE OF DISCHARGE:  01/29/2004                                 DISCHARGE SUMMARY   DIAGNOSIS:  Adenocarcinoma of the prostate.   OPERATIVE PROCEDURE:  Radical retropubic prostatectomy on January 25, 2004.   HISTORY AND PHYSICAL EXAMINATION:  Jay Bailey is a very nice 70 year old  white male who was seen with an elevated PSA of 12.49 and subsequently  underwent transurethral ultrasound and biopsy of the prostate which revealed  a Gleason score 7 which is 4 + 3 adenocarcinoma involving 5% of the tissue  on the left.  He had understood the risks, benefits and alternatives.  Metastatic workup was negative.  Elected to proceed with radical retropubic  prostatectomy.   Past Medical History, Social History, Family History and Review of Systems  please see History and Physical Examination for full details.   PHYSICAL EXAMINATION:  VITAL SIGNS:  He was afebrile, vital signs stable.  Borderline retarded.  HEAD, EARS, EYES, NOSE, AND THROAT:  Normal.  NECK:  Without masses or thyromegaly.  CHEST:  Normal diaphragmatic motion.  ABDOMEN:  Soft, nontender without masses, organomegaly, or hernia.  EXTREMITIES:  Normal.  NEURO:  Intact.  SKIN:  Normal.  GENITOURINARY:  Penis, meatus, scrotum, testicles, anus, perineum normal.  Rectal vault is empty.  Prostate is about 30 grams and really smooth,  symmetrical, and benign to palpation.   HOSPITAL COURSE:  The patient was admitted.  After undergoing proper  preoperative evaluation, was subsequently taken to surgery on January 25, 2004 and underwent radical retropubic prostatectomy uneventfully.  Immediate  postop he was doing  well.  Postoperative labs were essentially normal.  Hemoglobin was 10 by January 25, 2004.  On January 26, 2004  hemoglobin  was 8.3, hematocrit 24, white blood cell count was 8.7.  BMET was  essentially normal.  He was transfused 2 units of autologous blood.  He  progressively improved.  By January 27, 2004 hemoglobin was 10.9,  hematocrit 31.8.  He was progressively improving, tolerating a diet.  [facility]  outpatient was low therefore it was discontinued.  He had positive flatus.  On postop day #3, January 28, 2004, he continued to progressively improve,  tolerating a diet.  Was somewhat distended on February 26th but was  tolerating fluid and with laxatives.  The drain was out, the wound was  clear.  He became ambulatory.  He improved and was subsequently discharged  on January 29, 2004.   DISCHARGE MEDICATIONS:  Included Vicodin, Cipro, and Colace.  He was to  follow up Monday for staple removal.  Leg bag and  Foley were instructed and  was discharged.   DISCHARGE DIAGNOSIS:  Prostate cancer.   FINAL PATHOLOGY:  Revealed a Gleason score 7 which was 3 + 4 adenocarcinoma,  was a clinical stage pT2a, PN0 PMX with negative lymph nodes.                                               Ronald L. Ovidio Hanger, M.D.    RLD/MEDQ  D:  02/08/2004  T:  02/09/2004  Job:  [id number]

## 2011-04-20 NOTE — Op Note (Signed)
[facility]  Patient:    Jay Bailey, Jay Bailey                         MRN: [id number] Proc. Date: 05/31/00 Adm. Date:  [id number] Attending:  Nelma Rothman Iii                           Operative Report  DIAGNOSIS:  Elevated PSA.  OPERATIVE PROCEDURE:  Examination under anesthesia.  SURGEON:  Jay Bailey. Jay Bailey, M.D.  ANESTHESIA:  MAC.  COMPLICATIONS:  None.  INDICATIONS FOR PROCEDURE:  The patient is a very nice 70 year old borderline retarded white male who presented with an elevated PSA of 11.6.  He was uncomfortable undergoing exam, and we felt that it was critically important. In addition, he has had a repeat PSA which was 5.76, which is a marked decrease, but we still felt that examination under anesthesia was indicated.  We will follow is PSA but, again, felt it critically important.  The patient understands the risks, benefits and alternatives and elected to proceed with this under MAC analgesia.  PROCEDURE IN DETAIL:  The patient was placed in the supine position.  After proper MAC analgesia, he was placed in the dorsal lithotomy position.  Examination of he penis and scrotum showed the penis had no lesions of the meatus.  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  There were some skin lesions that had been addressed by Dr. Campbell Stall.  Rectal examination revealed an empty vault.  The  prostate was 25 g and totally benign to palpation.  He tolerated the procedure ell and was taken to the recovery room. DD:  05/31/00 TD:  06/01/00 Job: [id number]

## 2011-04-20 NOTE — Op Note (Signed)
   NAME:  Jay Bailey, Jay Bailey                          ACCOUNT NO.:  [id number]   MEDICAL RECORD NO.:  [id number]                   PATIENT TYPE:  AMB   LOCATION:  DSC                                  FACILITY:  [facility]   PHYSICIAN:  Ronald L. Ovidio Hanger, M.D.           DATE OF BIRTH:  July 16, 1941   DATE OF PROCEDURE:  09/28/2003  DATE OF DISCHARGE:                                 OPERATIVE REPORT   PREOPERATIVE DIAGNOSES:  Elevated PSA, inability to undergo ultrasound  biopsy in the office.   POSTOPERATIVE DIAGNOSES:  Elevated PSA, inability to undergo ultrasound  biopsy in the office.   OPERATION:  Transrectal ultrasound and biopsy of the prostate.   SURGEON:  Lucrezia Starch. Earlene Plater, M.D.   ANESTHESIA:  LMA.   ESTIMATED BLOOD LOSS:  Negligible.   TUBES:  None.   COMPLICATIONS:  None.   INDICATIONS FOR PROCEDURE:  [name] is a very nice 70 year old white male  whose had a progressively elevated PSA of 12.49. He really cannot undergo  procedures in the office. He has some borderline retardation and after  understanding the risks, benefits, and alternatives, he and his family  elected to proceed with transrectal ultrasound and biopsy under anesthesia.   DESCRIPTION OF PROCEDURE:  The patient was placed in supine position and  after proper LMA anesthesia was placed in the dorsal lithotomy position,  prepped and draped appropriately. Utilizing the 7 MHz probe and the Seaman's  ultrasound device, transrectal ultrasound of the prostate was performed with  biopsy. The prostate was noted 90.05 mL utilizing volumetric. There was  significant transitional cell hypertrophy and a few central zone  calcifications. There were no hypo or hyperechoic nodules noted, the capsule  was intact as were seminal vesicle and seminal vesicle prostatic angle.  Utilizing the biopsy probe, biopsies were obtained from the base, mid and  apical prostate laterally and the mid and apical prostate medially for a  total of 5 biopsies on each side and submitted as right and left side. He  was told to expect blood in his urine, blood in his stool, blood in his  ejaculate. If he gets a temperature above 101 or difficulty urinating, he  will call. He tolerated the procedure well and was taken to the recovery  room stable. He also call on Friday for his biopsy results.                                               Ronald L. Ovidio Hanger, M.D.    RLD/MEDQ  D:  09/28/2003  T:  09/28/2003  Job:  [id number]

## 2011-04-20 NOTE — Op Note (Signed)
NAME:  Jay Bailey, Jay Bailey                          ACCOUNT NO.:  [id number]   MEDICAL RECORD NO.:  [id number]                   PATIENT TYPE:  INP   LOCATION:  0368                                 FACILITY:  [facility]   PHYSICIAN:  Lucrezia Starch. Earlene Plater, M.D.               DATE OF BIRTH:  Dec 21, 1940   DATE OF PROCEDURE:  01/25/2004  DATE OF DISCHARGE:                                 OPERATIVE REPORT   PREOPERATIVE DIAGNOSIS:  Adenocarcinoma of the prostate.   POSTOPERATIVE DIAGNOSIS:  Adenocarcinoma of the prostate.   PROCEDURE:  Radical retropubic prostatectomy with bilateral pelvic lymph  node dissection.   SURGEON:  Lucrezia Starch. Earlene Plater, [place].   FIRST ASSISTANT:  [place] [employer], M.D.   ANESTHESIA:  General endotracheal.   DRAINS:  1. A 20 French Foley catheter to straight drain.  2. A #10 Blake drain to bulb suction.   ESTIMATED BLOOD LOSS:  800 mL.   PATHOLOGY:  1. Bilateral pelvic lymph nodes.  2. Prostate.   COMPLICATIONS:  None.   DISPOSITION:  To postanesthesia care unit in stable condition.   BRIEF INDICATION FOR PROCEDURE:  Jay Bailey is a 70 year old male who was  recently evaluated for an elevated PSA and subsequently found to have  adenocarcinoma of the prostate.  His pathology revealed Gleason's 4+3=7.  Because of these findings, the patient's treatment options were discussed  with him and he decided to undergo radical retropubic prostatectomy after  understanding the risks, benefits, and alternatives.   DESCRIPTION OF PROCEDURE IN DETAIL:  The patient was brought to the  operating room and correctly identified by his identification bracelet.  He  was given preoperative antibiotics and general endotracheal anesthesia.  He  was placed in a supine position with a bump under his pelvis.  The table was  flexed and put in reverse Trendelenburg.  The patient was shaved, prepped  and draped in typical sterile fashion.  A midline vertical infraumbilical  incision was made in the  skin with a scalpel from the pubis to approximately  2 cm below the umbilicus.  Bovie electrocautery was used to incise Camper's  and Scarpa's fascia down to the rectus sheath.  The rectus sheath was  incised with the Bovie electrocautery, revealing the two bellies of the  rectus abdominis muscle.  The transversalis fascia was incised with the  Bovie electrocautery, revealing the space of Retzius.  The space was further  defined bluntly with surgeon's hand.  The Bookwalter retractor was placed  and bilateral pelvic lymph node dissection was performed.  The extent of the  dissection was from the external iliac vein anteriorly to the obturator  nerve posteriorly.  The distal extent of the dissection was to circumflex  iliac veins inferiorly and superiorly to just below the iliac where the  ureter crossed into the pelvis.  Extreme care was taken to avoid injury to  the obturator nerve on both  sides.  Large Weck Hemoloc clips were placed at  the proximal and distal aspect of the node packet.  Small branches from the  iliac vein were ligated with small Hemoloc clips.  There was minimal  bleeding from this part of the dissection, and the patient did not have any  clinical disease within his lymph nodes.   The areolar tissue overlying the endopelvic fascia was bluntly swept away  with a Kitner.  The bladder was retracted superiorly with the Bookwalter.  The patient was found to have quite a large prostate, which was deep within  his pelvis.  The endopelvic fascia was incised bilaterally with Metzenbaum  scissors.  The right angle Bovie electrocautery was used to incise the  endopelvic fascia.  Medially the lateral prostatic fascia was taken down to  allow the neurovascular bundles to be pushed posteriorly away from the plane  of dissection at a later point in the surgery when the lateral pedicles were  taken down.  The apex of the prostate was well underneath the pubis and was  difficult to  palpate the dorsal venous complex at this point.  The  puboprostatic ligaments were so far underneath the pelvis that they could  not be readily identified.  They could, however, be palpated.  After careful  removal of the fatty tissue overlying the puboprostatic ligaments, they were  incised bilaterally with Metzenbaum scissors, dropping the apex of the  prostate away from the pubis.  There was a large superficial branch of the  dorsal venous complex which was dissected carefully with the right angle and  ligated distally and incised with the Bovie electrocautery proximally.  At  this point the patient was found to have large veins in the pelvic sidewall  running just lateral to where the endopelvic fascia was incised.  These were  controlled with teased dissection away from the pelvic sidewall and doubly  ligating them with 2-0 silk ties and incising them.  At this point the apex  of the prostate could be palpated with surgeon's finger.  A good groove was  established between the posterior aspect of the dorsal venous complex and  the anterior urethra.  A Hohenfellner right angle was placed in this groove  and the dorsal venous complex was doubly ligated with two #1 Vicryl ties.  The Stamey retractor was placed in the pelvis and a backbleeding stitch was  placed at the base of the prostate with 2-0 Vicryl in a figure-of-eight  fashion.  The Hohenfellner right angle was replaced and the dorsal venous  complex was incised carefully with the Bovie electrocautery.  This did not  appear to involve the apex of the prostate.  Again the apex was still so far  beneath the pubis that the urethra was not readily identifiable.  With the  significant retraction superiorly, the urethra could be identified.  The  lateral neurovascular bundles were dissected away from the urethra.  A right  angle was placed posterior to the urethra and a piece of umbilical tape was passed beneath the urethra.  The anterior  two-thirds of the urethral wall  were incised with a #15 blade scalpel.  The Foley valve stem was lubricated  and incised and pulled into the pelvis and clamped with a Kelly.  The  posterior urethral wall was then incised with the Metzenbaum scissors.  A  good posterior plane was not readily established, but there were some  rectourethralis attachments which were carefully dissected with right angle  dissection and incised with Metzenbaum scissors.  Bluntly a posterior plane  was then established, revealing the lateral pedicles.  The pedicles were  systematically taken down bilaterally with right angle dissection and  placing large Hemoloc clips on the pedicles and incising them with Bovie  electrocautery.  Once the pedicles were adequately taken down, the seminal  vesicles and ampulla of the vas deferens were palpated beneath  Denonvillier's fascia.  This fascia was scored transversely with the Bovie  electrocautery and Metzenbaum dissection was used to get into the plane  overlying the ampulla of the vas.  With the right angle, the ampullae of  both vasa deferentia were dissected away from the prostate.  Large Hemoloc  clips were placed on them and they were incised.  The patient had very large  seminal vesicles, which were dissected away from prostate laterally.  Large  clips were placed on the bases of the seminal vesicles, and they were  incised with the Metzenbaum scissors.  We turned our attention next to the  bladder neck.  The Bovie electrocautery was used to incise the bladder neck  in the plane between the base of the prostate and the bladder.  Care was  taken to avoid undermining the trigone of the posterior bladder.  After the  base of the prostate was freed from the bladder, this freed the prostate in  its entirety and it was removed from the pelvis.  The pedicles were  inspected and a few spots were oversewn with 2-0 Vicryl ties.  There was  some minimal oozing around the  bladder neck, which was controlled with Bovie  electrocautery.  The posterior bladder neck required minimal reconstruction  with a tennis racquet stitch at 6 o'clock.  The bladder mucosa was everted  with several interrupted 4-0 chromic sutures.  The reconstruction stitch at  6 o'clock was a 3-0 chromic.  After the bladder neck was reconstructed, the  anastomotic sutures were placed in the urethra.  A 28 French Greenwald  urethral sound was placed in the urethra.  Anastomotic sutures were placed  at 2, 5, 7, 10, and 12 o'clock.  The posterior sutures were placed in their  corresponding position in the bladder neck.  The catheter was placed into  the bladder.  The safety stitch was tied to the end of the catheter and the  needle was passed through the anterior bladder wall.  The remaining  anastomotic sutures were placed at their corresponding locations.  The  bladder was released from traction, allowed to drop down into the pelvis. The anastomotic sutures were then tied.  An Asepto syringe was used to  irrigate the bladder and irrigated it clearly, and there was no leakage of  fluid from the anastomosis.  The safety stitch in the catheter was placed to  the anterior abdominal wall on the right side of the incision.  A #10 Blake  drain was placed in the left side of the incision.  The drain was sewn in  place with a 3-0 silk.  The pelvis was copiously irrigated with antibiotic  solution.  The rectus fascia was reapproximated with a #1 PDS in a running  fashion.  The subcutaneous tissues were copiously irrigated and the skin was  closed with a stapling device.  All sponge and needle counts were correct at  the end of the case.  Please note that Dr. Gaynelle Arabian was present and  participated in all aspects of this case, as he is the  primary surgeon.  The  patient awakened from his anesthesia without complications and was taken to  postanesthesia care unit in stable condition, having tolerated  the procedure  well.     Susanne Borders, MD                           Lucrezia Starch. Earlene Plater, M.D.    DR/MEDQ  D:  01/25/2004  T:  01/26/2004  Job:  [id number]

## 2011-04-20 NOTE — H&P (Signed)
NAME:  Jay Bailey, Jay Bailey                          ACCOUNT NO.:  [id number]   MEDICAL RECORD NO.:  [id number]                   PATIENT TYPE:  INP   LOCATION:  0368                                 FACILITY:  [facility]   PHYSICIAN:  Lucrezia Starch. Ovidio Hanger, M.D.           DATE OF BIRTH:  Feb 20, 1941   DATE OF ADMISSION:  01/25/2004  DATE OF DISCHARGE:                                HISTORY & PHYSICAL   ATTENDING PHYSICIAN:  Dr. Marcelyn Bruins.   CHIEF COMPLAINT:  Prostate cancer.   HISTORY OF PRESENT ILLNESS:  Jay Bailey is a 70 year old male who has  recently been evaluated, by Dr. Darvin Neighbours, for evaluation of an elevated  prostate-specific antigen.  The patient was found to have a PSA of 12.49.  Subsequently, the patient underwent a transrectal ultrasound, got a prostate  biopsy.  Unfortunately, this revealed Gleason's IV + III = VII  adenocarcinoma of the prostate involving 5% of the tissue on the left.  The  various treatment options were discussed with the patient including  radiation therapy, watchful waiting with expected management, and radical  prostatectomy.  After carefully discussing all the risks, benefits and  alternatives with the patient, the patient decided to undergo a radical  retropubic prostatectomy with bilateral pelvic lymph node dissection.  He  will be admitted postoperatively today for his convalescence.   PAST MEDICAL HISTORY:  1. Hyperlipidemia.  2. Hypertension.   PAST SURGICAL HISTORY:  Bilateral hernia repairs.   MEDICATIONS:  Lipitor, Diovan.   ALLERGIES:  No known drug allergies.   SOCIAL HISTORY:  The patient drinks alcohol occasionally.  He denies tobacco  use.   FAMILY HISTORY:  No known genitourinary disease.   REVIEW OF SYSTEMS:  Per history of present illness.  Otherwise negative  times all systems.   PHYSICAL EXAMINATION:  VITAL SIGNS:  Blood pressure 140/78, pulse 88,  temperature 98.8, respirations 16.  HEENT:  Head is normocephalic,  atraumatic.  Extraocular movements are  intact.  Mucous membranes are moist without erythema.  NECK:  Supple without masses.  LUNGS:  Clear to auscultation bilaterally.  HEART:  Regular rate and rhythm without murmurs, gallops or rubs.  ABDOMEN:  Soft, nontender, nondistended.  There are bilateral inguinal scars  which are healing from his past hernia repairs.  EXTREMITIES:  Warm and well perfused.  NEUROLOGIC:  No focal motor or sensory deficits are appreciated.   IMPRESSION:  Adenocarcinoma of the prostate.   PLAN:  The patient is to undergo radical retropubic prostatectomy today with  bilateral pelvic lymph node dissection.  Postoperatively, he will be  admitted to Dr. Earlene Plater' service for his postoperative care.     Susanne Borders, MD                           Lucrezia Starch. Ovidio Hanger, M.D.    DR/MEDQ  D:  01/25/2004  T:  01/25/2004  Job:  [id number]

## 2011-04-20 NOTE — Op Note (Signed)
NAME:  Jay Bailey, Jay Bailey NO.:  [id number]   MEDICAL RECORD NO.:  [id number]          PATIENT TYPE:  AMB   LOCATION:  DAY                          FACILITY:  [facility]   PHYSICIAN:  Leonie Man, M.D.   DATE OF BIRTH:  03-08-1941   DATE OF PROCEDURE:  10/31/2005  DATE OF DISCHARGE:                                 OPERATIVE REPORT   PREOPERATIVE DIAGNOSIS:  Recurrent left inguinal hernia   POSTOPERATIVE DIAGNOSIS:  Recurrent left inguinal hernia   PROCEDURE:  Repair recurrent left inguinal hernia with mesh.   SURGEON:  Leonie Man, M.D.   ASSISTANT:  OR tech.   ANESTHESIA:  General.   The patient is a 70 year old man who is status post a McVay type hernia  repair in 1987. He underwent a radical prostatectomy recently. He has since  developed a left recurrent inguinal hernia and comes to the operating room  for repair. 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 They all understand and gave consent to operation.   PROCEDURE NOTE:  Following the induction of satisfactory general anesthesia  with the patient positioned supinely, the groins were prepped and draped to  be included in a sterile operative field. I made a transverse incision  through the old scar cicatrix deepening this through the skin and  subcutaneous tissue with dissection carried down to the external oblique  aponeurosis. This dissection was carried inferiorly and medially to the  region of the external inguinal ring. We carefully opened up the external  inguinal ring without any injury to the spermatic cord on the left side.  Medially and at the pubic tubercle is where the large hernia recurrence was  noted. The spermatic cord was identified and dissected free from this large  defect and the defect was dissected on all sides and carried down to its  neck which was rather broad-based. The  attenuated transversalis fascia was  then trimmed from the hernia and the preperitoneal fat and then reduced back  into the peritoneal cavity. I closed the remnants of the transversalis  fascia so as to hold the fat back to a certain point. I then used a Prolene  hernia mesh system and inserted this into the preperitoneal space deploying  the mesh inferiorly, medially, laterally and superiorly to completely cover  the region of the defect. I then completed closing the transversalis fascia  and then deployed the external portion of the mesh into the inguinal canal.  I made a transverse cut laterally into the mesh so as to allow the  protrusion of the spermatic cord through the mass and I then recreated a new  internal ring by sewing the mesh down to the shelving edge of Poupart's  ligament. Then starting at the pubic tubercle,  I trimmed and then sutured  the mesh to the pubic tubercle and carried this along the shelving edge of  Poupart's ligament up to the newly-formed internal ring and again from the  pubic tubercle up along the  conjoined tendon to the newly-formed internal  ring. The remainder of the mesh extension was deployed over the internal  oblique muscles. All areas of dissection were then checked for hemostasis  and noted to be dry. Sponge, instrument and sharp counts were then verified.  The external oblique aponeurosis was closed over the spermatic cord thereby  reapproximating the external inguinal canal with a running suture of 2-0  Vicryl. Scarpa's fascia was then closed with a running 3-0 Vicryl suture and  the skin was closed with 4-0 Monocryl  suture placed intradermally. The wound was then reinforced with Steri-  Strips, sterile dressings applied, anesthetic reversed and the patient  removed from the operating room to the recovery room in stable condition. He  tolerated the procedure well.      Leonie Man, M.D.  Electronically Signed     PB/MEDQ  D:   10/31/2005  T:  11/01/2005  Job:  [id number]

## 2011-06-25 ENCOUNTER — Encounter: Payer: Self-pay | Admitting: Podiatry

## 2011-08-31 LAB — CBC
HCT: 27 — ABNORMAL LOW
HCT: 30.9 — ABNORMAL LOW
Hemoglobin: 10.6 — ABNORMAL LOW
Hemoglobin: 16.1
Hemoglobin: 9.1 — ABNORMAL LOW
Hemoglobin: 9.3 — ABNORMAL LOW
MCHC: 34.1
Platelets: 142 — ABNORMAL LOW
Platelets: 177
RBC: 2.85 — ABNORMAL LOW
RBC: 2.89 — ABNORMAL LOW
RDW: 14
RDW: 14.1
WBC: 6.5
WBC: 8
WBC: 8.8

## 2011-08-31 LAB — BASIC METABOLIC PANEL
CO2: 27
Calcium: 7.9 — ABNORMAL LOW
Chloride: 105
GFR calc Af Amer: 57 — ABNORMAL LOW
GFR calc Af Amer: >60
GFR calc non Af Amer: 47 — ABNORMAL LOW
Potassium: 5.2 — ABNORMAL HIGH
Sodium: 136
Sodium: 136

## 2011-08-31 LAB — COMPREHENSIVE METABOLIC PANEL
ALT: 38
Albumin: 4.4
Alkaline Phosphatase: 110
Potassium: 5.8 — ABNORMAL HIGH
Sodium: 146 — ABNORMAL HIGH
Total Protein: 7.3

## 2011-08-31 LAB — URINALYSIS, ROUTINE W REFLEX MICROSCOPIC
Nitrite: NEGATIVE
Protein, ur: NEGATIVE
pH: 5.5

## 2011-08-31 LAB — PROTIME-INR
INR: 1.9 — ABNORMAL HIGH
INR: 2.3 — ABNORMAL HIGH
INR: 2.6 — ABNORMAL HIGH
Prothrombin Time: 26.9 — ABNORMAL HIGH
Prothrombin Time: 29.5 — ABNORMAL HIGH

## 2011-08-31 LAB — TYPE AND SCREEN: Antibody Screen: NEGATIVE

## 2011-09-10 LAB — COMPREHENSIVE METABOLIC PANEL
AST: 22
Albumin: 4.3
Alkaline Phosphatase: 153 — ABNORMAL HIGH
Chloride: 105
GFR calc Af Amer: >60
Potassium: 4.6
Sodium: 139
Total Bilirubin: 0.7
Total Protein: 7.2

## 2011-09-10 LAB — DIFFERENTIAL
Basophils Absolute: 0
Basophils Relative: 0
Eosinophils Relative: 3
Monocytes Absolute: 0.5
Monocytes Relative: 6

## 2011-09-10 LAB — CBC
Platelets: 222
RDW: 14.7
WBC: 7.9

## 2011-09-13 LAB — CROSSMATCH: Antibody Screen: NEGATIVE

## 2011-09-13 LAB — BASIC METABOLIC PANEL
BUN: 11
CO2: 28
CO2: 28
Chloride: 105
Chloride: 106
Creatinine, Ser: 1.38
GFR calc Af Amer: >60
GFR calc non Af Amer: >60
Glucose, Bld: 152 — ABNORMAL HIGH
Potassium: 4
Potassium: 4.2
Sodium: 137

## 2011-09-13 LAB — COMPREHENSIVE METABOLIC PANEL
AST: 27
CO2: 25
Calcium: 10.4
Creatinine, Ser: 1.18
GFR calc Af Amer: >60
GFR calc non Af Amer: >60
Glucose, Bld: 111 — ABNORMAL HIGH
Total Protein: 7.1

## 2011-09-13 LAB — CBC
HCT: 28.9 — ABNORMAL LOW
HCT: 31.4 — ABNORMAL LOW
HCT: 32.4 — ABNORMAL LOW
Hemoglobin: 10.9 — ABNORMAL LOW
MCHC: 34.5
MCHC: 34.6
MCHC: 34.7
MCHC: 34
MCV: 93.2
MCV: 93.9
MCV: 94.2
MCV: 94
Platelets: 136 — ABNORMAL LOW
Platelets: 138 — ABNORMAL LOW
RBC: 3.46 — ABNORMAL LOW
RBC: 5.01
RDW: 13.4
RDW: 13.7
WBC: 7.7
WBC: 9.1

## 2011-09-13 LAB — URINALYSIS, ROUTINE W REFLEX MICROSCOPIC
Ketones, ur: NEGATIVE
Nitrite: NEGATIVE
Protein, ur: NEGATIVE

## 2011-09-13 LAB — PROTIME-INR: Prothrombin Time: 13.3

## 2011-09-13 LAB — APTT: aPTT: 30

## 2011-12-12 DIAGNOSIS — M79609 Pain in unspecified limb: Secondary | ICD-10-CM | POA: Diagnosis not present

## 2011-12-12 DIAGNOSIS — M216X9 Other acquired deformities of unspecified foot: Secondary | ICD-10-CM | POA: Diagnosis not present

## 2011-12-12 DIAGNOSIS — B351 Tinea unguium: Secondary | ICD-10-CM | POA: Diagnosis not present

## 2011-12-12 DIAGNOSIS — M7750 Other enthesopathy of unspecified foot: Secondary | ICD-10-CM | POA: Diagnosis not present

## 2011-12-18 DIAGNOSIS — R7301 Impaired fasting glucose: Secondary | ICD-10-CM | POA: Diagnosis not present

## 2011-12-18 DIAGNOSIS — C61 Malignant neoplasm of prostate: Secondary | ICD-10-CM | POA: Diagnosis not present

## 2011-12-18 DIAGNOSIS — K5900 Constipation, unspecified: Secondary | ICD-10-CM | POA: Diagnosis not present

## 2011-12-18 DIAGNOSIS — E785 Hyperlipidemia, unspecified: Secondary | ICD-10-CM | POA: Diagnosis not present

## 2011-12-18 DIAGNOSIS — I10 Essential (primary) hypertension: Secondary | ICD-10-CM | POA: Diagnosis not present

## 2012-01-10 DIAGNOSIS — C8409 Mycosis fungoides, extranodal and solid organ sites: Secondary | ICD-10-CM | POA: Diagnosis not present

## 2012-01-10 DIAGNOSIS — D239 Other benign neoplasm of skin, unspecified: Secondary | ICD-10-CM | POA: Diagnosis not present

## 2012-01-10 DIAGNOSIS — L570 Actinic keratosis: Secondary | ICD-10-CM | POA: Diagnosis not present

## 2012-01-15 DIAGNOSIS — M169 Osteoarthritis of hip, unspecified: Secondary | ICD-10-CM | POA: Diagnosis not present

## 2012-02-18 DIAGNOSIS — B351 Tinea unguium: Secondary | ICD-10-CM | POA: Diagnosis not present

## 2012-02-18 DIAGNOSIS — M79609 Pain in unspecified limb: Secondary | ICD-10-CM | POA: Diagnosis not present

## 2012-03-03 DIAGNOSIS — H903 Sensorineural hearing loss, bilateral: Secondary | ICD-10-CM | POA: Diagnosis not present

## 2012-03-03 DIAGNOSIS — H6120 Impacted cerumen, unspecified ear: Secondary | ICD-10-CM | POA: Diagnosis not present

## 2012-03-18 DIAGNOSIS — C61 Malignant neoplasm of prostate: Secondary | ICD-10-CM | POA: Diagnosis not present

## 2012-04-09 DIAGNOSIS — J069 Acute upper respiratory infection, unspecified: Secondary | ICD-10-CM | POA: Diagnosis not present

## 2012-04-11 DIAGNOSIS — J069 Acute upper respiratory infection, unspecified: Secondary | ICD-10-CM | POA: Diagnosis not present

## 2012-04-18 DIAGNOSIS — J40 Bronchitis, not specified as acute or chronic: Secondary | ICD-10-CM | POA: Diagnosis not present

## 2012-05-01 DIAGNOSIS — H1045 Other chronic allergic conjunctivitis: Secondary | ICD-10-CM | POA: Diagnosis not present

## 2012-05-14 DIAGNOSIS — M79609 Pain in unspecified limb: Secondary | ICD-10-CM | POA: Diagnosis not present

## 2012-05-14 DIAGNOSIS — B351 Tinea unguium: Secondary | ICD-10-CM | POA: Diagnosis not present

## 2012-06-16 DIAGNOSIS — E785 Hyperlipidemia, unspecified: Secondary | ICD-10-CM | POA: Diagnosis not present

## 2012-06-16 DIAGNOSIS — I10 Essential (primary) hypertension: Secondary | ICD-10-CM | POA: Diagnosis not present

## 2012-06-16 DIAGNOSIS — C61 Malignant neoplasm of prostate: Secondary | ICD-10-CM | POA: Diagnosis not present

## 2012-06-16 DIAGNOSIS — R32 Unspecified urinary incontinence: Secondary | ICD-10-CM | POA: Diagnosis not present

## 2012-06-16 DIAGNOSIS — Z23 Encounter for immunization: Secondary | ICD-10-CM | POA: Diagnosis not present

## 2012-06-30 DIAGNOSIS — C8409 Mycosis fungoides, extranodal and solid organ sites: Secondary | ICD-10-CM | POA: Diagnosis not present

## 2012-06-30 DIAGNOSIS — I872 Venous insufficiency (chronic) (peripheral): Secondary | ICD-10-CM | POA: Diagnosis not present

## 2012-07-07 DIAGNOSIS — N393 Stress incontinence (female) (male): Secondary | ICD-10-CM | POA: Diagnosis not present

## 2012-07-07 DIAGNOSIS — C61 Malignant neoplasm of prostate: Secondary | ICD-10-CM | POA: Diagnosis not present

## 2012-08-13 DIAGNOSIS — B351 Tinea unguium: Secondary | ICD-10-CM | POA: Diagnosis not present

## 2012-08-13 DIAGNOSIS — M79609 Pain in unspecified limb: Secondary | ICD-10-CM | POA: Diagnosis not present

## 2012-08-19 DIAGNOSIS — H1045 Other chronic allergic conjunctivitis: Secondary | ICD-10-CM | POA: Diagnosis not present

## 2012-09-16 DIAGNOSIS — C61 Malignant neoplasm of prostate: Secondary | ICD-10-CM | POA: Diagnosis not present

## 2012-09-24 DIAGNOSIS — Z23 Encounter for immunization: Secondary | ICD-10-CM | POA: Diagnosis not present

## 2012-10-17 DIAGNOSIS — J069 Acute upper respiratory infection, unspecified: Secondary | ICD-10-CM | POA: Diagnosis not present

## 2012-10-22 DIAGNOSIS — H903 Sensorineural hearing loss, bilateral: Secondary | ICD-10-CM | POA: Diagnosis not present

## 2012-10-22 DIAGNOSIS — H6120 Impacted cerumen, unspecified ear: Secondary | ICD-10-CM | POA: Diagnosis not present

## 2012-11-10 DIAGNOSIS — H0230 Blepharochalasis unspecified eye, unspecified eyelid: Secondary | ICD-10-CM | POA: Diagnosis not present

## 2012-11-12 DIAGNOSIS — B351 Tinea unguium: Secondary | ICD-10-CM | POA: Diagnosis not present

## 2012-11-12 DIAGNOSIS — M79609 Pain in unspecified limb: Secondary | ICD-10-CM | POA: Diagnosis not present

## 2012-12-25 DIAGNOSIS — C61 Malignant neoplasm of prostate: Secondary | ICD-10-CM | POA: Diagnosis not present

## 2012-12-25 DIAGNOSIS — I10 Essential (primary) hypertension: Secondary | ICD-10-CM | POA: Diagnosis not present

## 2012-12-25 DIAGNOSIS — E785 Hyperlipidemia, unspecified: Secondary | ICD-10-CM | POA: Diagnosis not present

## 2013-01-12 DIAGNOSIS — N393 Stress incontinence (female) (male): Secondary | ICD-10-CM | POA: Diagnosis not present

## 2013-01-12 DIAGNOSIS — C61 Malignant neoplasm of prostate: Secondary | ICD-10-CM | POA: Diagnosis not present

## 2013-01-29 DIAGNOSIS — C8409 Mycosis fungoides, extranodal and solid organ sites: Secondary | ICD-10-CM | POA: Diagnosis not present

## 2013-01-29 DIAGNOSIS — D239 Other benign neoplasm of skin, unspecified: Secondary | ICD-10-CM | POA: Diagnosis not present

## 2013-01-29 DIAGNOSIS — L570 Actinic keratosis: Secondary | ICD-10-CM | POA: Diagnosis not present

## 2013-01-29 DIAGNOSIS — L259 Unspecified contact dermatitis, unspecified cause: Secondary | ICD-10-CM | POA: Diagnosis not present

## 2013-01-29 DIAGNOSIS — L821 Other seborrheic keratosis: Secondary | ICD-10-CM | POA: Diagnosis not present

## 2013-03-04 DIAGNOSIS — B351 Tinea unguium: Secondary | ICD-10-CM | POA: Diagnosis not present

## 2013-03-04 DIAGNOSIS — M79609 Pain in unspecified limb: Secondary | ICD-10-CM | POA: Diagnosis not present

## 2013-04-28 DIAGNOSIS — H903 Sensorineural hearing loss, bilateral: Secondary | ICD-10-CM | POA: Diagnosis not present

## 2013-04-28 DIAGNOSIS — H6120 Impacted cerumen, unspecified ear: Secondary | ICD-10-CM | POA: Diagnosis not present

## 2013-04-30 DIAGNOSIS — H1045 Other chronic allergic conjunctivitis: Secondary | ICD-10-CM | POA: Diagnosis not present

## 2013-05-06 DIAGNOSIS — E669 Obesity, unspecified: Secondary | ICD-10-CM | POA: Diagnosis not present

## 2013-05-06 DIAGNOSIS — Z1331 Encounter for screening for depression: Secondary | ICD-10-CM | POA: Diagnosis not present

## 2013-05-06 DIAGNOSIS — R7301 Impaired fasting glucose: Secondary | ICD-10-CM | POA: Diagnosis not present

## 2013-05-06 DIAGNOSIS — I10 Essential (primary) hypertension: Secondary | ICD-10-CM | POA: Diagnosis not present

## 2013-05-14 DIAGNOSIS — R7301 Impaired fasting glucose: Secondary | ICD-10-CM | POA: Diagnosis not present

## 2013-05-14 DIAGNOSIS — E669 Obesity, unspecified: Secondary | ICD-10-CM | POA: Diagnosis not present

## 2013-05-18 DIAGNOSIS — H2510 Age-related nuclear cataract, unspecified eye: Secondary | ICD-10-CM | POA: Diagnosis not present

## 2013-05-25 ENCOUNTER — Encounter: Payer: Medicare Other | Attending: Family Medicine | Admitting: Dietician

## 2013-05-25 ENCOUNTER — Encounter: Payer: Self-pay | Admitting: Dietician

## 2013-05-25 VITALS — Ht 66.0 in | Wt 223.1 lb

## 2013-05-25 DIAGNOSIS — Z713 Dietary counseling and surveillance: Secondary | ICD-10-CM | POA: Insufficient documentation

## 2013-05-25 DIAGNOSIS — R635 Abnormal weight gain: Secondary | ICD-10-CM | POA: Diagnosis not present

## 2013-05-25 NOTE — Progress Notes (Signed)
Medical Nutrition Therapy:  Appt start time: 1500 end time:  1600.  Assessment:  Primary concerns today: unintentional wt gain.   MEDICATIONS: see list.   DIETARY INTAKE:  Usual eating pattern includes 2 meals and 1-2 snacks per day.  Everyday foods include cheese.  Avoided foods include desserts.    24-hr recall:  B ( AM): eggs (2) on toast, sausage (low fat), coffee (2% milk, sweet n low), cranberry juice or OJ Snk ( AM): none  L ( PM): none Snk ( PM): cheese D ( PM): [place] patty or salmon patty, veg (usually carrots or coleslaw), fruit, orange, grapes, banana. He claims to have been avoiding starches, especially bread and rice. When eats out, he gets a salad (lettuce, cucumbers, tomatoes, carrots, pepper, cheese, oil and vinegar) Snk ( PM): cheese or cookie Beverages: coffee, water, sweet tea, juice, lemonade. No EtOH  Usual physical activity: few days per week he goes to the [facility] for water aerobics (1 hour); TuTh goes to the [facility] for 11 minutes each tru machine, treadmill, and bike machine, and 4 sets of leg extensions  Pt has a housekeeper who cooks for him once or twice per week. She prepares a large portion of protein for him to reheat and a few salads to have throughout the week. Pt seems uncomfortable preparing most foods himself, but does shop for himself. 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  Progress Towards Goal(s):  In progress.   Nutritional Diagnosis:  [place]-3.4 Unintentional weight gain As related to high intake of sweetened beverages, inconsistent meal pattern.  As evidenced by pt diet recall containing multiple sweetened beverages, frequently skipping lunch, pt claims of reaching highest weight in his life.    Intervention:  Nutrition counseling provided regarding reducing kcal intake, normalizing meal pattern. Pt is limited in means and ability to prepare meals but is making positive efforts to improve his diet, and keeps a modest food record. Because he  relies heavily on the preparation of a few meals by his housekeeper, messages were limited to immediate impact and things mostly within the pt's control. RD focused on reduction of sweetened beverages, choosing low fat foods for snacks/small meals. Cheese and yogurt were highlighted, as the pt seems to like these items as snacks very much. RD also advised the pt to eat at least 3 meals each day, each having at least a protein component, and ideally also having a vegetable component. RD explained the need to limit the amount of salad dressing used to limit fat intake. Counseling also provided regarding limiting salt intake to improve BP outcomes.  Pt requested short turnaround on F/U "until I start losing weight." Pt also asked about meds to aid in wt loss, but RD advised diet and exercise first.   Handouts given during visit include:  High Fat, Sugar, Protein, Fiber Foods list  Tips to Lower High Blood Pressure  Goals: Mr. Schnepf will choose crystal light in favor of juices. Mr. Tallarico will make his tea with Sweet n Low or Splenda instead of sugar. Mr. Bourdon will choose low fat versions of cheeses and yogurt for snacks. Mr. Delage will eat 3 meals per day with at least one high protein food at each meal. Mr. Belling will walk 30 minutes twice per week in addition to his current gym schedule.   Monitoring/Evaluation:  Dietary intake, exercise, portion control, and body weight in 5 week(s).

## 2013-05-27 DIAGNOSIS — B351 Tinea unguium: Secondary | ICD-10-CM | POA: Diagnosis not present

## 2013-05-27 DIAGNOSIS — M79609 Pain in unspecified limb: Secondary | ICD-10-CM | POA: Diagnosis not present

## 2013-06-15 ENCOUNTER — Encounter: Payer: Self-pay | Admitting: Family Medicine

## 2013-06-24 DIAGNOSIS — C61 Malignant neoplasm of prostate: Secondary | ICD-10-CM | POA: Diagnosis not present

## 2013-06-24 DIAGNOSIS — N318 Other neuromuscular dysfunction of bladder: Secondary | ICD-10-CM | POA: Diagnosis not present

## 2013-06-24 DIAGNOSIS — R7309 Other abnormal glucose: Secondary | ICD-10-CM | POA: Diagnosis not present

## 2013-06-24 DIAGNOSIS — N289 Disorder of kidney and ureter, unspecified: Secondary | ICD-10-CM | POA: Diagnosis not present

## 2013-06-24 DIAGNOSIS — E785 Hyperlipidemia, unspecified: Secondary | ICD-10-CM | POA: Diagnosis not present

## 2013-06-24 DIAGNOSIS — I10 Essential (primary) hypertension: Secondary | ICD-10-CM | POA: Diagnosis not present

## 2013-06-29 ENCOUNTER — Ambulatory Visit: Payer: Medicare Other | Admitting: Dietician

## 2013-07-20 DIAGNOSIS — N393 Stress incontinence (female) (male): Secondary | ICD-10-CM | POA: Diagnosis not present

## 2013-07-20 DIAGNOSIS — C61 Malignant neoplasm of prostate: Secondary | ICD-10-CM | POA: Diagnosis not present

## 2013-07-29 DIAGNOSIS — M79609 Pain in unspecified limb: Secondary | ICD-10-CM | POA: Diagnosis not present

## 2013-07-29 DIAGNOSIS — B351 Tinea unguium: Secondary | ICD-10-CM | POA: Diagnosis not present

## 2013-07-31 DIAGNOSIS — Z0000 Encounter for general adult medical examination without abnormal findings: Secondary | ICD-10-CM | POA: Diagnosis not present

## 2013-07-31 DIAGNOSIS — E785 Hyperlipidemia, unspecified: Secondary | ICD-10-CM | POA: Diagnosis not present

## 2013-07-31 DIAGNOSIS — D696 Thrombocytopenia, unspecified: Secondary | ICD-10-CM | POA: Diagnosis not present

## 2013-07-31 DIAGNOSIS — I10 Essential (primary) hypertension: Secondary | ICD-10-CM | POA: Diagnosis not present

## 2013-07-31 DIAGNOSIS — C61 Malignant neoplasm of prostate: Secondary | ICD-10-CM | POA: Diagnosis not present

## 2013-07-31 DIAGNOSIS — Z23 Encounter for immunization: Secondary | ICD-10-CM | POA: Diagnosis not present

## 2013-10-20 DIAGNOSIS — C61 Malignant neoplasm of prostate: Secondary | ICD-10-CM | POA: Diagnosis not present

## 2013-10-20 DIAGNOSIS — D696 Thrombocytopenia, unspecified: Secondary | ICD-10-CM | POA: Diagnosis not present

## 2013-11-04 ENCOUNTER — Ambulatory Visit (INDEPENDENT_AMBULATORY_CARE_PROVIDER_SITE_OTHER): Payer: Medicare Other | Admitting: Podiatrist

## 2013-11-04 ENCOUNTER — Encounter: Payer: Self-pay | Admitting: Podiatrist

## 2013-11-04 VITALS — BP 118/59 | HR 59 | Resp 16

## 2013-11-04 DIAGNOSIS — B351 Tinea unguium: Secondary | ICD-10-CM

## 2013-11-04 DIAGNOSIS — M79609 Pain in unspecified limb: Secondary | ICD-10-CM

## 2013-11-04 DIAGNOSIS — L84 Corns and callosities: Secondary | ICD-10-CM | POA: Diagnosis not present

## 2013-11-04 NOTE — Patient Instructions (Signed)

## 2013-11-04 NOTE — Progress Notes (Signed)
HPI:  Patient presents today for follow up of foot and nail care. Denies any new complaints today.  Objective:  Patients chart is reviewed.  Neurovascular status unchanged.  Patients nails are thickened, discolored, distrophic, friable and brittle with yellow-brown discoloration. Patient subjectively relates they are painful with shoes and with ambulation of bilateral feet. Patient also has a hyperkeratotic porokeratotic lesions submetatarsal 3 of the right foot and submetatarsal one and 3 of the left foot. Metatarsals are prominent plantarflexed and the lesions are causing pain.  Assessment:  Symptomatic onychomycosis  Plan:  Discussed treatment options and alternatives.  The symptomatic toenails were debrided through manual an mechanical means without complication.  Return appointment recommended at routine intervals of 3 months    Marlowe Aschoff, DPM

## 2013-11-10 DIAGNOSIS — H6120 Impacted cerumen, unspecified ear: Secondary | ICD-10-CM | POA: Diagnosis not present

## 2013-11-10 DIAGNOSIS — H903 Sensorineural hearing loss, bilateral: Secondary | ICD-10-CM | POA: Diagnosis not present

## 2014-01-25 DIAGNOSIS — R972 Elevated prostate specific antigen [PSA]: Secondary | ICD-10-CM | POA: Insufficient documentation

## 2014-01-25 DIAGNOSIS — N3946 Mixed incontinence: Secondary | ICD-10-CM | POA: Insufficient documentation

## 2014-01-25 DIAGNOSIS — C61 Malignant neoplasm of prostate: Secondary | ICD-10-CM | POA: Diagnosis not present

## 2014-02-02 DIAGNOSIS — M1610 Unilateral primary osteoarthritis, unspecified hip: Secondary | ICD-10-CM | POA: Diagnosis not present

## 2014-02-02 DIAGNOSIS — M169 Osteoarthritis of hip, unspecified: Secondary | ICD-10-CM | POA: Diagnosis not present

## 2014-02-02 DIAGNOSIS — Z96649 Presence of unspecified artificial hip joint: Secondary | ICD-10-CM | POA: Diagnosis not present

## 2014-02-03 ENCOUNTER — Ambulatory Visit: Payer: Medicare Other | Admitting: Podiatrist

## 2014-02-09 DIAGNOSIS — H903 Sensorineural hearing loss, bilateral: Secondary | ICD-10-CM | POA: Diagnosis not present

## 2014-02-09 DIAGNOSIS — H6120 Impacted cerumen, unspecified ear: Secondary | ICD-10-CM | POA: Diagnosis not present

## 2014-02-11 DIAGNOSIS — C8409 Mycosis fungoides, extranodal and solid organ sites: Secondary | ICD-10-CM | POA: Diagnosis not present

## 2014-02-11 DIAGNOSIS — D485 Neoplasm of uncertain behavior of skin: Secondary | ICD-10-CM | POA: Diagnosis not present

## 2014-02-11 DIAGNOSIS — L821 Other seborrheic keratosis: Secondary | ICD-10-CM | POA: Diagnosis not present

## 2014-02-11 DIAGNOSIS — L570 Actinic keratosis: Secondary | ICD-10-CM | POA: Diagnosis not present

## 2014-02-11 DIAGNOSIS — D239 Other benign neoplasm of skin, unspecified: Secondary | ICD-10-CM | POA: Diagnosis not present

## 2014-02-12 ENCOUNTER — Ambulatory Visit (INDEPENDENT_AMBULATORY_CARE_PROVIDER_SITE_OTHER): Payer: Medicare Other | Admitting: Podiatrist

## 2014-02-12 ENCOUNTER — Encounter: Payer: Self-pay | Admitting: Podiatrist

## 2014-02-12 VITALS — BP 137/71 | HR 55 | Resp 18

## 2014-02-12 DIAGNOSIS — L821 Other seborrheic keratosis: Secondary | ICD-10-CM | POA: Diagnosis not present

## 2014-02-12 DIAGNOSIS — B351 Tinea unguium: Secondary | ICD-10-CM | POA: Diagnosis not present

## 2014-02-12 DIAGNOSIS — M79609 Pain in unspecified limb: Secondary | ICD-10-CM | POA: Diagnosis not present

## 2014-02-12 DIAGNOSIS — M216X9 Other acquired deformities of unspecified foot: Secondary | ICD-10-CM

## 2014-02-12 DIAGNOSIS — L84 Corns and callosities: Secondary | ICD-10-CM

## 2014-02-12 NOTE — Progress Notes (Signed)
I am here to get my toenails trimmed    HPI: Patient presents today for follow up of foot and nail care. Denies any new complaints today.   Objective: Patients chart is reviewed. Neurovascular status unchanged. Patients nails are thickened, discolored, distrophic, friable and brittle with yellow-brown discoloration. Patient subjectively relates they are painful with shoes and with ambulation of bilateral feet. Patient also has a hyperkeratotic porokeratotic lesions submetatarsal 3 of the right foot and submetatarsal 1 of the left foot. Metatarsals are prominent plantarflexed and the lesions are causing pain.   Assessment: Symptomatic onychomycosis , painful calluses x 2  Plan: Discussed treatment options and alternatives. The symptomatic toenails were debrided through manual an mechanical means without complication. Return appointment recommended at routine intervals of 3 months

## 2014-03-02 DIAGNOSIS — H1045 Other chronic allergic conjunctivitis: Secondary | ICD-10-CM | POA: Diagnosis not present

## 2014-03-25 DIAGNOSIS — E785 Hyperlipidemia, unspecified: Secondary | ICD-10-CM | POA: Diagnosis not present

## 2014-03-25 DIAGNOSIS — K5900 Constipation, unspecified: Secondary | ICD-10-CM | POA: Diagnosis not present

## 2014-03-25 DIAGNOSIS — R21 Rash and other nonspecific skin eruption: Secondary | ICD-10-CM | POA: Diagnosis not present

## 2014-03-25 DIAGNOSIS — I10 Essential (primary) hypertension: Secondary | ICD-10-CM | POA: Diagnosis not present

## 2014-03-25 DIAGNOSIS — J309 Allergic rhinitis, unspecified: Secondary | ICD-10-CM | POA: Diagnosis not present

## 2014-03-25 DIAGNOSIS — R7309 Other abnormal glucose: Secondary | ICD-10-CM | POA: Diagnosis not present

## 2014-03-25 DIAGNOSIS — Z23 Encounter for immunization: Secondary | ICD-10-CM | POA: Diagnosis not present

## 2014-03-25 DIAGNOSIS — C61 Malignant neoplasm of prostate: Secondary | ICD-10-CM | POA: Diagnosis not present

## 2014-05-20 ENCOUNTER — Ambulatory Visit (INDEPENDENT_AMBULATORY_CARE_PROVIDER_SITE_OTHER): Payer: Medicare Other | Admitting: Podiatrist

## 2014-05-20 DIAGNOSIS — M79609 Pain in unspecified limb: Secondary | ICD-10-CM

## 2014-05-20 DIAGNOSIS — M79606 Pain in leg, unspecified: Secondary | ICD-10-CM

## 2014-05-20 DIAGNOSIS — B351 Tinea unguium: Secondary | ICD-10-CM | POA: Diagnosis not present

## 2014-05-20 NOTE — Progress Notes (Signed)
HPI: Patient presents today for follow up of foot and nail care. Denies any new complaints today.  Objective: Patients chart is reviewed. Neurovascular status unchanged. Patients nails are thickened, discolored, distrophic, friable and brittle with yellow-brown discoloration. Patient subjectively relates they are painful with shoes and with ambulation of bilateral feet. Patient also has a hyperkeratotic porokeratotic lesions submetatarsal 3 of the right foot and submetatarsal 1 of the left foot. Metatarsals are prominent plantarflexed and the lesions are causing pain.  Assessment: Symptomatic onychomycosis , painful calluses x 2  Plan: Discussed treatment options and alternatives. The symptomatic toenails and lesions were debrided through manual an mechanical means without complication. Return appointment recommended at routine intervals of 3 months

## 2014-06-29 DIAGNOSIS — R7309 Other abnormal glucose: Secondary | ICD-10-CM | POA: Diagnosis not present

## 2014-08-26 ENCOUNTER — Ambulatory Visit: Payer: Medicare Other | Admitting: Podiatrist

## 2014-09-02 ENCOUNTER — Ambulatory Visit (INDEPENDENT_AMBULATORY_CARE_PROVIDER_SITE_OTHER): Payer: Medicare Other | Admitting: Podiatrist

## 2014-09-02 ENCOUNTER — Ambulatory Visit: Payer: Medicare Other | Admitting: Podiatrist

## 2014-09-02 DIAGNOSIS — L84 Corns and callosities: Secondary | ICD-10-CM

## 2014-09-02 DIAGNOSIS — B351 Tinea unguium: Secondary | ICD-10-CM | POA: Diagnosis not present

## 2014-09-02 DIAGNOSIS — M216X9 Other acquired deformities of unspecified foot: Secondary | ICD-10-CM

## 2014-09-02 DIAGNOSIS — M79676 Pain in unspecified toe(s): Secondary | ICD-10-CM | POA: Diagnosis not present

## 2014-09-02 NOTE — Progress Notes (Signed)
HPI: Patient presents today for follow up of foot and nail care. Denies any new complaints today.  Objective: Patients chart is reviewed. Neurovascular status unchanged. Patients nails are thickened, discolored, distrophic, friable and brittle with yellow-brown discoloration. Patient subjectively relates they are painful with shoes and with ambulation of bilateral feet. Patient also has a hyperkeratotic porokeratotic lesions submetatarsal 3 of the right foot and submetatarsal 1 of the left foot. Metatarsals are prominent plantarflexed and the lesions are causing pain.  Assessment: Symptomatic onychomycosis , painful calluses x 2  Plan: Discussed treatment options and alternatives. The symptomatic toenails and lesions were debrided through manual an mechanical means without complication. Return appointment recommended at routine intervals of 3 months

## 2014-09-23 ENCOUNTER — Other Ambulatory Visit: Payer: Self-pay | Admitting: Family Medicine

## 2014-09-23 DIAGNOSIS — J309 Allergic rhinitis, unspecified: Secondary | ICD-10-CM | POA: Diagnosis not present

## 2014-09-23 DIAGNOSIS — E785 Hyperlipidemia, unspecified: Secondary | ICD-10-CM | POA: Diagnosis not present

## 2014-09-23 DIAGNOSIS — Z8546 Personal history of malignant neoplasm of prostate: Secondary | ICD-10-CM | POA: Diagnosis not present

## 2014-09-23 DIAGNOSIS — I10 Essential (primary) hypertension: Secondary | ICD-10-CM | POA: Diagnosis not present

## 2014-09-23 DIAGNOSIS — Z0000 Encounter for general adult medical examination without abnormal findings: Secondary | ICD-10-CM | POA: Diagnosis not present

## 2014-09-23 DIAGNOSIS — R0989 Other specified symptoms and signs involving the circulatory and respiratory systems: Secondary | ICD-10-CM

## 2014-09-23 DIAGNOSIS — H1012 Acute atopic conjunctivitis, left eye: Secondary | ICD-10-CM | POA: Diagnosis not present

## 2014-09-23 DIAGNOSIS — R7309 Other abnormal glucose: Secondary | ICD-10-CM | POA: Diagnosis not present

## 2014-09-26 DIAGNOSIS — Z23 Encounter for immunization: Secondary | ICD-10-CM | POA: Diagnosis not present

## 2014-09-29 ENCOUNTER — Ambulatory Visit
Admission: RE | Admit: 2014-09-29 | Discharge: 2014-09-29 | Disposition: A | Discharge status: Home / Self Care | Payer: Medicare Other | Source: Ambulatory Visit | Attending: Family Medicine | Admitting: Family Medicine

## 2014-09-29 ENCOUNTER — Other Ambulatory Visit: Payer: Medicare Other

## 2014-09-29 DIAGNOSIS — R0989 Other specified symptoms and signs involving the circulatory and respiratory systems: Secondary | ICD-10-CM

## 2014-09-30 DIAGNOSIS — H903 Sensorineural hearing loss, bilateral: Secondary | ICD-10-CM | POA: Diagnosis not present

## 2014-09-30 DIAGNOSIS — H6123 Impacted cerumen, bilateral: Secondary | ICD-10-CM | POA: Diagnosis not present

## 2014-11-09 ENCOUNTER — Ambulatory Visit (INDEPENDENT_AMBULATORY_CARE_PROVIDER_SITE_OTHER): Payer: Medicare Other

## 2014-11-09 DIAGNOSIS — M79674 Pain in right toe(s): Secondary | ICD-10-CM | POA: Diagnosis not present

## 2014-11-09 DIAGNOSIS — S90111A Contusion of right great toe without damage to nail, initial encounter: Secondary | ICD-10-CM

## 2014-11-09 DIAGNOSIS — M79673 Pain in unspecified foot: Secondary | ICD-10-CM | POA: Diagnosis not present

## 2014-11-09 DIAGNOSIS — B351 Tinea unguium: Secondary | ICD-10-CM

## 2014-11-09 NOTE — Patient Instructions (Signed)
ANTIBACTERIAL SOAP INSTRUCTIONS  THE DAY AFTER PROCEDURE  Please follow the instructions your doctor has marked.   Shower as usual. Before getting out, place a drop of antibacterial liquid soap (Dial) on a wet, clean washcloth.  Gently wipe washcloth over affected area.  Afterward, rinse the area with warm water.  Blot the area dry with a soft cloth and cover with antibiotic ointment (neosporin, polysporin, bacitracin) and band aid or gauze and tape  Place 3-4 drops of antibacterial liquid soap in a quart of warm tap water.  Submerge foot into water for 20 minutes.  If bandage was applied after your procedure, leave on to allow for easy lift off, then remove and continue with soak for the remaining time.  Next, blot area dry with a soft cloth and cover with a bandage.  Apply other medications as directed by your doctor, such as cortisporin otic solution (eardrops) or neosporin antibiotic ointment  Wash the toe with the damage nailbed daily with soap and water dry and apply some Neosporin to the nailbed as instructed. May use Neosporin or Polysporin or triple anabolic ointment

## 2014-11-09 NOTE — Progress Notes (Signed)
   Subjective:    Patient ID: Jay Bailey, male    DOB: 1940/12/11, 73 y.o.   MRN: [id number]  HPI  Pt presents for nail debridement.  Patient did have an injury to his right great toe is with painful sensitive signs of bleeding from the nailbed of the right hallux. Jay Bailey bumped against something of the nail plate is significantly loose at this time and the toe is swollen and tender to touch and palpation. No malodor no ascending cellulitis lymphangitis noted  Review of Systems no new findings or systemic changes noted     Objective:   Physical Exam Lower extremity objective findings as follows vascular status appears to be intact pedal pulses are palpable DP +2 PT 1 over 4 bilateral capillary refill time 3 seconds all digits and proprioceptive sensations diminished on [employer] however there is hypersensitivity to both feet nails thick brittle [place] friable dystrophic the right hallux nail is separated from nailbed with some dried blood there is onycholysis or partial avulsion of the nail Jay Bailey from a contusion on the right toe. Touch and palpation overfever no chills no signs of infection the nail plate is loose and is completely debrided back to the proximal nail fold at this time Neosporin or triple about ointment and a Band-Aid dressing are applied patient will maintain antibiotic ointment and Band-Aid dressings given some extension applicators for him to reach the toe. Remaining nails also thick brittle crumbly friable consistent with onychomycosis       Assessment & Plan:  Assessment this time is onychomycosis painful mycotic brittle crumbly dystrophic nails debrided at this time 1 through 5 bilateral however only new issue is a contusion to the right great toe showing a partial avulsion or lysis of the right hallux nail plate which is debrided away treated with triple antibiotic ointment and a Band-Aid dressing being applied x-rays reveal no fracture no cysts no other osseous  abnormality likely just contusion to the toe and trauma to the nail plate and nailbed. Anabolic ointment as indicated wash daily with soap and water recheck in 3 months for follow-up and continued palliative and mycotic nail care is needed next  Harriet Masson DPM

## 2014-11-12 ENCOUNTER — Ambulatory Visit: Payer: Medicare Other | Admitting: Podiatrist

## 2014-11-23 DIAGNOSIS — R05 Cough: Secondary | ICD-10-CM | POA: Diagnosis not present

## 2014-12-09 ENCOUNTER — Ambulatory Visit: Payer: Medicare Other | Admitting: Podiatrist

## 2014-12-13 DIAGNOSIS — D485 Neoplasm of uncertain behavior of skin: Secondary | ICD-10-CM | POA: Diagnosis not present

## 2014-12-14 DIAGNOSIS — C4331 Malignant melanoma of nose: Secondary | ICD-10-CM | POA: Diagnosis not present

## 2014-12-14 DIAGNOSIS — D0339 Melanoma in situ of other parts of face: Secondary | ICD-10-CM | POA: Diagnosis not present

## 2014-12-21 DIAGNOSIS — C4331 Malignant melanoma of nose: Secondary | ICD-10-CM | POA: Insufficient documentation

## 2014-12-31 DIAGNOSIS — C4331 Malignant melanoma of nose: Secondary | ICD-10-CM | POA: Diagnosis not present

## 2015-01-21 DIAGNOSIS — C61 Malignant neoplasm of prostate: Secondary | ICD-10-CM | POA: Diagnosis not present

## 2015-01-21 DIAGNOSIS — Z01812 Encounter for preprocedural laboratory examination: Secondary | ICD-10-CM | POA: Diagnosis not present

## 2015-01-21 DIAGNOSIS — R001 Bradycardia, unspecified: Secondary | ICD-10-CM | POA: Diagnosis not present

## 2015-01-21 DIAGNOSIS — Z01810 Encounter for preprocedural cardiovascular examination: Secondary | ICD-10-CM | POA: Diagnosis not present

## 2015-01-24 DIAGNOSIS — C439 Malignant melanoma of skin, unspecified: Secondary | ICD-10-CM | POA: Insufficient documentation

## 2015-01-24 DIAGNOSIS — C61 Malignant neoplasm of prostate: Secondary | ICD-10-CM | POA: Diagnosis not present

## 2015-01-24 DIAGNOSIS — N3941 Urge incontinence: Secondary | ICD-10-CM | POA: Diagnosis not present

## 2015-01-31 DIAGNOSIS — Z7982 Long term (current) use of aspirin: Secondary | ICD-10-CM | POA: Diagnosis not present

## 2015-01-31 DIAGNOSIS — R590 Localized enlarged lymph nodes: Secondary | ICD-10-CM | POA: Diagnosis not present

## 2015-01-31 DIAGNOSIS — E780 Pure hypercholesterolemia: Secondary | ICD-10-CM | POA: Diagnosis not present

## 2015-01-31 DIAGNOSIS — D0339 Melanoma in situ of other parts of face: Secondary | ICD-10-CM | POA: Diagnosis not present

## 2015-01-31 DIAGNOSIS — C439 Malignant melanoma of skin, unspecified: Secondary | ICD-10-CM | POA: Diagnosis not present

## 2015-01-31 DIAGNOSIS — C4331 Malignant melanoma of nose: Secondary | ICD-10-CM | POA: Diagnosis not present

## 2015-01-31 DIAGNOSIS — Z8546 Personal history of malignant neoplasm of prostate: Secondary | ICD-10-CM | POA: Diagnosis not present

## 2015-01-31 DIAGNOSIS — I10 Essential (primary) hypertension: Secondary | ICD-10-CM | POA: Diagnosis not present

## 2015-02-08 DIAGNOSIS — Z483 Aftercare following surgery for neoplasm: Secondary | ICD-10-CM | POA: Diagnosis not present

## 2015-02-08 DIAGNOSIS — D0339 Melanoma in situ of other parts of face: Secondary | ICD-10-CM | POA: Diagnosis not present

## 2015-02-10 ENCOUNTER — Ambulatory Visit: Payer: Medicare Other | Admitting: Podiatrist

## 2015-02-15 DIAGNOSIS — D0339 Melanoma in situ of other parts of face: Secondary | ICD-10-CM | POA: Diagnosis not present

## 2015-02-17 ENCOUNTER — Ambulatory Visit: Payer: Medicare Other | Admitting: Podiatrist

## 2015-02-17 DIAGNOSIS — N3946 Mixed incontinence: Secondary | ICD-10-CM | POA: Diagnosis not present

## 2015-02-17 DIAGNOSIS — Z801 Family history of malignant neoplasm of trachea, bronchus and lung: Secondary | ICD-10-CM | POA: Diagnosis not present

## 2015-02-17 DIAGNOSIS — C4331 Malignant melanoma of nose: Secondary | ICD-10-CM | POA: Diagnosis not present

## 2015-02-17 DIAGNOSIS — I10 Essential (primary) hypertension: Secondary | ICD-10-CM | POA: Diagnosis not present

## 2015-02-17 DIAGNOSIS — Z8546 Personal history of malignant neoplasm of prostate: Secondary | ICD-10-CM | POA: Diagnosis not present

## 2015-02-17 DIAGNOSIS — Z7982 Long term (current) use of aspirin: Secondary | ICD-10-CM | POA: Diagnosis not present

## 2015-02-17 DIAGNOSIS — E780 Pure hypercholesterolemia: Secondary | ICD-10-CM | POA: Diagnosis not present

## 2015-02-17 DIAGNOSIS — Z96643 Presence of artificial hip joint, bilateral: Secondary | ICD-10-CM | POA: Diagnosis not present

## 2015-02-24 ENCOUNTER — Ambulatory Visit (INDEPENDENT_AMBULATORY_CARE_PROVIDER_SITE_OTHER): Payer: Medicare Other | Admitting: Podiatrist

## 2015-02-24 ENCOUNTER — Encounter: Payer: Self-pay | Admitting: Podiatrist

## 2015-02-24 DIAGNOSIS — B351 Tinea unguium: Secondary | ICD-10-CM | POA: Diagnosis not present

## 2015-02-24 DIAGNOSIS — M79676 Pain in unspecified toe(s): Secondary | ICD-10-CM | POA: Diagnosis not present

## 2015-03-05 NOTE — Progress Notes (Signed)
HPI: Patient presents today for follow up of foot and nail care. Denies any new complaints today. Doing well from the previous nail procedure to remove the hallux nail performed by dr. Blenda Mounts.  Objective: Patients chart is reviewed. Neurovascular status unchanged. Patients nails are thickened, discolored, distrophic, friable and brittle with yellow-brown discoloration. Patient subjectively relates they are painful with shoes and with ambulation of bilateral feet. Patient also has a hyperkeratotic porokeratotic lesions submetatarsal 3 of the right foot and submetatarsal 1 of the left foot. Metatarsals are prominent plantarflexed and the lesions are causing pain.  Assessment: Symptomatic onychomycosis , painful calluses x 2  Plan: Discussed treatment options and alternatives. The symptomatic toenails and lesions were debrided through manual an mechanical means without complication. Return appointment recommended at routine intervals of 3 months

## 2015-04-20 DIAGNOSIS — C8409 Mycosis fungoides, extranodal and solid organ sites: Secondary | ICD-10-CM | POA: Diagnosis not present

## 2015-04-20 DIAGNOSIS — L570 Actinic keratosis: Secondary | ICD-10-CM | POA: Diagnosis not present

## 2015-04-20 DIAGNOSIS — Z85820 Personal history of malignant melanoma of skin: Secondary | ICD-10-CM | POA: Diagnosis not present

## 2015-04-22 DIAGNOSIS — C4331 Malignant melanoma of nose: Secondary | ICD-10-CM | POA: Diagnosis not present

## 2015-04-25 DIAGNOSIS — R972 Elevated prostate specific antigen [PSA]: Secondary | ICD-10-CM | POA: Diagnosis not present

## 2015-04-25 DIAGNOSIS — R399 Unspecified symptoms and signs involving the genitourinary system: Secondary | ICD-10-CM | POA: Diagnosis not present

## 2015-04-25 DIAGNOSIS — C61 Malignant neoplasm of prostate: Secondary | ICD-10-CM | POA: Diagnosis not present

## 2015-05-10 DIAGNOSIS — I739 Peripheral vascular disease, unspecified: Secondary | ICD-10-CM | POA: Diagnosis not present

## 2015-05-10 DIAGNOSIS — Z85820 Personal history of malignant melanoma of skin: Secondary | ICD-10-CM | POA: Diagnosis not present

## 2015-05-10 DIAGNOSIS — N3281 Overactive bladder: Secondary | ICD-10-CM | POA: Diagnosis not present

## 2015-05-10 DIAGNOSIS — E6609 Other obesity due to excess calories: Secondary | ICD-10-CM | POA: Diagnosis not present

## 2015-05-10 DIAGNOSIS — E785 Hyperlipidemia, unspecified: Secondary | ICD-10-CM | POA: Diagnosis not present

## 2015-05-10 DIAGNOSIS — K5900 Constipation, unspecified: Secondary | ICD-10-CM | POA: Diagnosis not present

## 2015-05-10 DIAGNOSIS — I10 Essential (primary) hypertension: Secondary | ICD-10-CM | POA: Diagnosis not present

## 2015-05-10 DIAGNOSIS — Z8546 Personal history of malignant neoplasm of prostate: Secondary | ICD-10-CM | POA: Diagnosis not present

## 2015-05-10 DIAGNOSIS — Z6834 Body mass index (BMI) 34.0-34.9, adult: Secondary | ICD-10-CM | POA: Diagnosis not present

## 2015-05-10 DIAGNOSIS — R7309 Other abnormal glucose: Secondary | ICD-10-CM | POA: Diagnosis not present

## 2015-06-02 ENCOUNTER — Ambulatory Visit: Payer: Medicare Other

## 2015-06-09 ENCOUNTER — Ambulatory Visit: Payer: Medicare Other

## 2015-06-21 ENCOUNTER — Encounter: Payer: Self-pay | Admitting: Podiatry

## 2015-06-21 ENCOUNTER — Ambulatory Visit (INDEPENDENT_AMBULATORY_CARE_PROVIDER_SITE_OTHER): Payer: Medicare Other | Admitting: Podiatry

## 2015-06-21 DIAGNOSIS — B351 Tinea unguium: Secondary | ICD-10-CM

## 2015-06-21 DIAGNOSIS — M79676 Pain in unspecified toe(s): Secondary | ICD-10-CM

## 2015-06-21 NOTE — Progress Notes (Signed)
Patient ID: Jay Bailey, male   DOB: Oct 03, 1941, 74 y.o.   MRN: [id number] Complaint:  Visit Type: Patient returns to my office for continued preventative foot care services. Complaint: Patient states" my nails have grown long and thick and become painful to walk and wear shoes" Patient has been diagnosed with DM with neuropathy.Marland Kitchen He presents for preventative foot care services. No changes to ROS  Podiatric Exam: Vascular: dorsalis pedis and posterior tibial pulses are palpable bilateral. Capillary return is immediate. Temperature gradient is WNL. Skin turgor WNL  Sensorium: Normal Semmes Weinstein monofilament test. Normal tactile sensation bilaterally. Nail Exam: Pt has thick disfigured discolored nails with subungual debris noted bilateral entire nail hallux through fifth toenails Ulcer Exam: There is no evidence of ulcer or pre-ulcerative changes or infection. Orthopedic Exam: Muscle tone and strength are WNL. No limitations in general ROM. No crepitus or effusions noted. Foot type and digits show no abnormalities. 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 Skin: No Porokeratosis. No infection or ulcers  Diagnosis:  Tinea unguium, Pain in right toe, pain in left toes  Treatment & Plan Procedures and Treatment: Consent by patient was obtained for treatment procedures. The patient understood the discussion of treatment and procedures well. All questions were answered thoroughly reviewed. Debridement of mycotic and hypertrophic toenails, 1 through 5 bilateral and clearing of subungual debris. No ulceration, no infection noted. To consider diabetic shoes. Return Visit-Office Procedure: Patient instructed to return to the office for a follow up visit 3 months for continued evaluation and treatment.

## 2015-06-23 ENCOUNTER — Ambulatory Visit: Payer: Medicare Other | Admitting: Podiatry

## 2015-07-25 DIAGNOSIS — L237 Allergic contact dermatitis due to plants, except food: Secondary | ICD-10-CM | POA: Diagnosis not present

## 2015-08-10 DIAGNOSIS — L237 Allergic contact dermatitis due to plants, except food: Secondary | ICD-10-CM | POA: Diagnosis not present

## 2015-08-25 DIAGNOSIS — H2513 Age-related nuclear cataract, bilateral: Secondary | ICD-10-CM | POA: Diagnosis not present

## 2015-08-25 DIAGNOSIS — H53002 Unspecified amblyopia, left eye: Secondary | ICD-10-CM | POA: Diagnosis not present

## 2015-08-26 DIAGNOSIS — Z85820 Personal history of malignant melanoma of skin: Secondary | ICD-10-CM | POA: Diagnosis not present

## 2015-08-26 DIAGNOSIS — Z8520 Personal history of malignant neoplasm of unspecified respiratory organ: Secondary | ICD-10-CM | POA: Diagnosis not present

## 2015-08-26 DIAGNOSIS — Z08 Encounter for follow-up examination after completed treatment for malignant neoplasm: Secondary | ICD-10-CM | POA: Diagnosis not present

## 2015-08-29 DIAGNOSIS — H6123 Impacted cerumen, bilateral: Secondary | ICD-10-CM | POA: Diagnosis not present

## 2015-08-29 DIAGNOSIS — H903 Sensorineural hearing loss, bilateral: Secondary | ICD-10-CM | POA: Diagnosis not present

## 2015-09-12 DIAGNOSIS — C8409 Mycosis fungoides, extranodal and solid organ sites: Secondary | ICD-10-CM | POA: Diagnosis not present

## 2015-09-12 DIAGNOSIS — L989 Disorder of the skin and subcutaneous tissue, unspecified: Secondary | ICD-10-CM | POA: Diagnosis not present

## 2015-09-12 DIAGNOSIS — Z872 Personal history of diseases of the skin and subcutaneous tissue: Secondary | ICD-10-CM | POA: Diagnosis not present

## 2015-09-12 DIAGNOSIS — D485 Neoplasm of uncertain behavior of skin: Secondary | ICD-10-CM | POA: Diagnosis not present

## 2015-09-12 DIAGNOSIS — Z85820 Personal history of malignant melanoma of skin: Secondary | ICD-10-CM | POA: Diagnosis not present

## 2015-09-12 DIAGNOSIS — I8310 Varicose veins of unspecified lower extremity with inflammation: Secondary | ICD-10-CM | POA: Diagnosis not present

## 2015-09-27 ENCOUNTER — Encounter: Payer: Self-pay | Admitting: Podiatry

## 2015-09-27 ENCOUNTER — Ambulatory Visit (INDEPENDENT_AMBULATORY_CARE_PROVIDER_SITE_OTHER): Payer: Medicare Other | Admitting: Podiatry

## 2015-09-27 DIAGNOSIS — M79673 Pain in unspecified foot: Secondary | ICD-10-CM | POA: Diagnosis not present

## 2015-09-27 DIAGNOSIS — B351 Tinea unguium: Secondary | ICD-10-CM

## 2015-09-27 NOTE — Progress Notes (Signed)
Patient ID: Jay Bailey, male   DOB: [birth year], 74 y.o.   MRN: [id number] Complaint:  Visit Type: Patient returns to my office for continued preventative foot care services. Complaint: Patient states" my nails have grown long and thick and become painful to walk and wear shoes" Patient has been diagnosed with DM with neuropathy.Marland Kitchen He presents for preventative foot care services. No changes to ROS  Podiatric Exam: Vascular: dorsalis pedis and posterior tibial pulses are palpable bilateral. Capillary return is immediate. Temperature gradient is WNL. Skin turgor WNL  Sensorium: Normal Semmes Weinstein monofilament test. Normal tactile sensation bilaterally. Nail Exam: Pt has thick disfigured discolored nails with subungual debris noted bilateral entire nail hallux through fifth toenails Ulcer Exam: There is no evidence of ulcer or pre-ulcerative changes or infection. Orthopedic Exam: Muscle tone and strength are WNL. No limitations in general ROM. No crepitus or effusions noted. Foot type and digits show no abnormalities. 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 Skin: No Porokeratosis. No infection or ulcers  Diagnosis:  Tinea unguium, Pain in right toe, pain in left toes  Treatment & Plan Procedures and Treatment: Consent by patient was obtained for treatment procedures. The patient understood the discussion of treatment and procedures well. All questions were answered thoroughly reviewed. Debridement of mycotic and hypertrophic toenails, 1 through 5 bilateral and clearing of subungual debris. No ulceration, no infection noted. To consider diabetic shoes. Return Visit-Office Procedure: Patient instructed to return to the office for a follow up visit 3 months for continued evaluation and treatment.

## 2015-10-05 DIAGNOSIS — J329 Chronic sinusitis, unspecified: Secondary | ICD-10-CM | POA: Diagnosis not present

## 2015-10-17 DIAGNOSIS — Z85820 Personal history of malignant melanoma of skin: Secondary | ICD-10-CM | POA: Diagnosis not present

## 2015-10-17 DIAGNOSIS — D485 Neoplasm of uncertain behavior of skin: Secondary | ICD-10-CM | POA: Diagnosis not present

## 2015-10-17 DIAGNOSIS — Z23 Encounter for immunization: Secondary | ICD-10-CM | POA: Diagnosis not present

## 2015-11-07 DIAGNOSIS — R7309 Other abnormal glucose: Secondary | ICD-10-CM | POA: Diagnosis not present

## 2015-11-07 DIAGNOSIS — Z125 Encounter for screening for malignant neoplasm of prostate: Secondary | ICD-10-CM | POA: Diagnosis not present

## 2015-11-07 DIAGNOSIS — Z8546 Personal history of malignant neoplasm of prostate: Secondary | ICD-10-CM | POA: Diagnosis not present

## 2015-11-07 DIAGNOSIS — Z1389 Encounter for screening for other disorder: Secondary | ICD-10-CM | POA: Diagnosis not present

## 2015-11-07 DIAGNOSIS — I10 Essential (primary) hypertension: Secondary | ICD-10-CM | POA: Diagnosis not present

## 2015-11-07 DIAGNOSIS — I739 Peripheral vascular disease, unspecified: Secondary | ICD-10-CM | POA: Diagnosis not present

## 2015-11-07 DIAGNOSIS — E785 Hyperlipidemia, unspecified: Secondary | ICD-10-CM | POA: Diagnosis not present

## 2015-11-07 DIAGNOSIS — E6609 Other obesity due to excess calories: Secondary | ICD-10-CM | POA: Diagnosis not present

## 2015-12-02 DIAGNOSIS — N3941 Urge incontinence: Secondary | ICD-10-CM | POA: Diagnosis not present

## 2015-12-02 DIAGNOSIS — C61 Malignant neoplasm of prostate: Secondary | ICD-10-CM | POA: Diagnosis not present

## 2015-12-26 DIAGNOSIS — H6123 Impacted cerumen, bilateral: Secondary | ICD-10-CM | POA: Diagnosis not present

## 2015-12-26 DIAGNOSIS — H903 Sensorineural hearing loss, bilateral: Secondary | ICD-10-CM | POA: Diagnosis not present

## 2016-01-17 ENCOUNTER — Ambulatory Visit (INDEPENDENT_AMBULATORY_CARE_PROVIDER_SITE_OTHER): Payer: Medicare Other | Admitting: Podiatry

## 2016-01-17 ENCOUNTER — Encounter: Payer: Self-pay | Admitting: Podiatry

## 2016-01-17 DIAGNOSIS — M79676 Pain in unspecified toe(s): Secondary | ICD-10-CM | POA: Diagnosis not present

## 2016-01-17 DIAGNOSIS — M79673 Pain in unspecified foot: Secondary | ICD-10-CM

## 2016-01-17 DIAGNOSIS — B351 Tinea unguium: Secondary | ICD-10-CM

## 2016-01-17 NOTE — Progress Notes (Signed)
Patient ID: Jay Bailey, male   DOB: 1940/12/22, 75 y.o.   MRN: [id number] Complaint:  Visit Type: Patient returns to my office for continued preventative foot care services. Complaint: Patient states" my nails have grown long and thick and become painful to walk and wear shoes" Patient has been diagnosed with DM with neuropathy.Marland Kitchen He presents for preventative foot care services. No changes to ROS  Podiatric Exam: Vascular: dorsalis pedis and posterior tibial pulses are palpable bilateral. Capillary return is immediate. Temperature gradient is WNL. Skin turgor WNL  Sensorium: Normal Semmes Weinstein monofilament test. Normal tactile sensation bilaterally. Nail Exam: Pt has thick disfigured discolored nails with subungual debris noted bilateral entire nail hallux through fifth toenails Ulcer Exam: There is no evidence of ulcer or pre-ulcerative changes or infection. Orthopedic Exam: Muscle tone and strength are WNL. No limitations in general ROM. No crepitus or effusions noted. Foot type and digits show no abnormalities. 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 Skin: No Porokeratosis. No infection or ulcers  Diagnosis:  Tinea unguium, Pain in right toe, pain in left toes  Treatment & Plan Procedures and Treatment: Consent by patient was obtained for treatment procedures. The patient understood the discussion of treatment and procedures well. All questions were answered thoroughly reviewed. Debridement of mycotic and hypertrophic toenails, 1 through 5 bilateral and clearing of subungual debris. No ulceration, no infection noted. To consider diabetic shoes. Return Visit-Office Procedure: Patient instructed to return to the office for a follow up visit 3 months for continued evaluation and treatment.   Gardiner Barefoot DPM

## 2016-01-27 DIAGNOSIS — C4331 Malignant melanoma of nose: Secondary | ICD-10-CM | POA: Diagnosis not present

## 2016-01-27 DIAGNOSIS — Z85820 Personal history of malignant melanoma of skin: Secondary | ICD-10-CM | POA: Diagnosis not present

## 2016-02-10 DIAGNOSIS — L309 Dermatitis, unspecified: Secondary | ICD-10-CM | POA: Diagnosis not present

## 2016-02-10 DIAGNOSIS — L299 Pruritus, unspecified: Secondary | ICD-10-CM | POA: Diagnosis not present

## 2016-02-15 DIAGNOSIS — Z23 Encounter for immunization: Secondary | ICD-10-CM | POA: Diagnosis not present

## 2016-02-15 DIAGNOSIS — R21 Rash and other nonspecific skin eruption: Secondary | ICD-10-CM | POA: Diagnosis not present

## 2016-03-06 DIAGNOSIS — C61 Malignant neoplasm of prostate: Secondary | ICD-10-CM | POA: Diagnosis not present

## 2016-04-10 DIAGNOSIS — H02844 Edema of left upper eyelid: Secondary | ICD-10-CM | POA: Diagnosis not present

## 2016-04-17 DIAGNOSIS — H903 Sensorineural hearing loss, bilateral: Secondary | ICD-10-CM | POA: Diagnosis not present

## 2016-04-17 DIAGNOSIS — H6123 Impacted cerumen, bilateral: Secondary | ICD-10-CM | POA: Diagnosis not present

## 2016-04-24 ENCOUNTER — Ambulatory Visit: Payer: Medicare Other | Admitting: Podiatry

## 2016-04-25 ENCOUNTER — Ambulatory Visit (INDEPENDENT_AMBULATORY_CARE_PROVIDER_SITE_OTHER): Payer: Medicare Other | Admitting: Podiatry

## 2016-04-25 ENCOUNTER — Encounter: Payer: Self-pay | Admitting: Podiatry

## 2016-04-25 DIAGNOSIS — B351 Tinea unguium: Secondary | ICD-10-CM

## 2016-04-25 DIAGNOSIS — M79673 Pain in unspecified foot: Secondary | ICD-10-CM

## 2016-04-25 DIAGNOSIS — E1149 Type 2 diabetes mellitus with other diabetic neurological complication: Secondary | ICD-10-CM | POA: Diagnosis not present

## 2016-04-25 NOTE — Progress Notes (Signed)
Patient ID: Jay Bailey, male   DOB: 08-Dec-1940, 75 y.o.   MRN: [id number] Complaint:  Visit Type: Patient returns to my office for continued preventative foot care services. Complaint: Patient states" my nails have grown long and thick and become painful to walk and wear shoes" Patient has been diagnosed with DM with neuropathy.Marland Kitchen He presents for preventative foot care services. No changes to ROS.  Painful third toenail right foot.  Podiatric Exam: Vascular: dorsalis pedis and posterior tibial pulses are palpable bilateral. Capillary return is immediate. Temperature gradient is WNL. Skin turgor WNL  Sensorium: Normal Semmes Weinstein monofilament test. Normal tactile sensation bilaterally. Nail Exam: Pt has thick disfigured discolored nails with subungual debris noted bilateral entire nail hallux through fifth toenails.  Third toe right foot self avulsed. Ulcer Exam: There is no evidence of ulcer or pre-ulcerative changes or infection. Orthopedic Exam: Muscle tone and strength are WNL. No limitations in general ROM. No crepitus or effusions noted. Foot type and digits show no abnormalities. 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 Skin: No Porokeratosis. No infection or ulcers  Diagnosis:  Tinea unguium, Pain in right toe, pain in left toes  Treatment & Plan Procedures and Treatment: Consent by patient was obtained for treatment procedures. The patient understood the discussion of treatment and procedures well. All questions were answered thoroughly reviewed. Debridement of mycotic and hypertrophic toenails, 1 through 5 bilateral and clearing of subungual debris. No ulceration, no infection noted. To consider diabetic shoes. Neosporin/DSD third toe right foot. Return Visit-Office Procedure: Patient instructed to return to the office for a follow up visit 3 months for continued evaluation and treatment.   Gardiner Barefoot DPM

## 2016-05-07 DIAGNOSIS — Z0000 Encounter for general adult medical examination without abnormal findings: Secondary | ICD-10-CM | POA: Diagnosis not present

## 2016-05-07 DIAGNOSIS — Z7984 Long term (current) use of oral hypoglycemic drugs: Secondary | ICD-10-CM | POA: Diagnosis not present

## 2016-05-07 DIAGNOSIS — E119 Type 2 diabetes mellitus without complications: Secondary | ICD-10-CM | POA: Diagnosis not present

## 2016-05-07 DIAGNOSIS — I739 Peripheral vascular disease, unspecified: Secondary | ICD-10-CM | POA: Diagnosis not present

## 2016-05-07 DIAGNOSIS — E785 Hyperlipidemia, unspecified: Secondary | ICD-10-CM | POA: Diagnosis not present

## 2016-05-07 DIAGNOSIS — I10 Essential (primary) hypertension: Secondary | ICD-10-CM | POA: Diagnosis not present

## 2016-05-07 DIAGNOSIS — N3281 Overactive bladder: Secondary | ICD-10-CM | POA: Diagnosis not present

## 2016-05-07 DIAGNOSIS — E6609 Other obesity due to excess calories: Secondary | ICD-10-CM | POA: Diagnosis not present

## 2016-05-07 DIAGNOSIS — Z8546 Personal history of malignant neoplasm of prostate: Secondary | ICD-10-CM | POA: Diagnosis not present

## 2016-05-22 ENCOUNTER — Encounter: Payer: Medicare Other | Attending: Family Medicine

## 2016-05-22 VITALS — Ht 67.0 in | Wt 220.8 lb

## 2016-05-22 DIAGNOSIS — E119 Type 2 diabetes mellitus without complications: Secondary | ICD-10-CM | POA: Diagnosis not present

## 2016-05-22 NOTE — Progress Notes (Signed)
Patient was seen on 05/22/16 for the first of a series of three diabetes self-management courses at [facility].  Patient Education Plan per assessed needs and concerns is to attend four course education program for Diabetes Self Management Education.  The following learning objectives were met by the patient during this class:  Describe diabetes  State some common risk factors for diabetes  Defines the role of glucose and insulin  Identifies type of diabetes and pathophysiology  Describe the relationship between diabetes and cardiovascular risk  State the members of the Healthcare Team  States the rationale for glucose monitoring  State when to test glucose  State their individual Target Range  State the importance of logging glucose readings  Describe how to interpret glucose readings  Identifies A1C target  Explain the correlation between A1c and eAG values  State symptoms and treatment of high blood glucose  State symptoms and treatment of low blood glucose  Explain proper technique for glucose testing  Identifies proper sharps disposal  Handouts given during class include:  Living Well with Diabetes book  Carb Counting and Meal Planning book  Meal Plan Card  Carbohydrate guide  Meal planning worksheet  Low Sodium Flavoring Tips  The diabetes portion plate  J8H to eAG Conversion Chart  Diabetes Medications  Diabetes Recommended Care Schedule  Support Group  Diabetes Success Plan  Core Class Satisfaction Survey  Follow-Up Plan:  Attend core 2

## 2016-05-28 DIAGNOSIS — D485 Neoplasm of uncertain behavior of skin: Secondary | ICD-10-CM | POA: Diagnosis not present

## 2016-05-28 DIAGNOSIS — Z85820 Personal history of malignant melanoma of skin: Secondary | ICD-10-CM | POA: Diagnosis not present

## 2016-05-28 DIAGNOSIS — C8409 Mycosis fungoides, extranodal and solid organ sites: Secondary | ICD-10-CM | POA: Diagnosis not present

## 2016-05-28 DIAGNOSIS — D0339 Melanoma in situ of other parts of face: Secondary | ICD-10-CM | POA: Diagnosis not present

## 2016-05-28 DIAGNOSIS — D235 Other benign neoplasm of skin of trunk: Secondary | ICD-10-CM | POA: Diagnosis not present

## 2016-05-28 DIAGNOSIS — Z872 Personal history of diseases of the skin and subcutaneous tissue: Secondary | ICD-10-CM | POA: Diagnosis not present

## 2016-05-28 DIAGNOSIS — D039 Melanoma in situ, unspecified: Secondary | ICD-10-CM | POA: Diagnosis not present

## 2016-05-28 DIAGNOSIS — L821 Other seborrheic keratosis: Secondary | ICD-10-CM | POA: Diagnosis not present

## 2016-05-29 DIAGNOSIS — E119 Type 2 diabetes mellitus without complications: Secondary | ICD-10-CM | POA: Diagnosis not present

## 2016-06-07 DIAGNOSIS — E119 Type 2 diabetes mellitus without complications: Secondary | ICD-10-CM | POA: Diagnosis not present

## 2016-06-12 ENCOUNTER — Encounter: Payer: Medicare Other | Attending: Family Medicine

## 2016-06-12 VITALS — Ht 67.0 in | Wt 216.9 lb

## 2016-06-12 DIAGNOSIS — E119 Type 2 diabetes mellitus without complications: Secondary | ICD-10-CM | POA: Diagnosis not present

## 2016-06-13 NOTE — Progress Notes (Signed)
Patient was seen on 06/12/16 for the third of a series of three diabetes self-management courses at the [facility].   Jay Bailey the amount of activity recommended for healthy living . Describe activities suitable for individual needs . Identify ways to regularly incorporate activity into daily life . Identify barriers to activity and ways to over come these barriers  Identify diabetes medications being personally used and their primary action for lowering glucose and possible side effects . Describe role of stress on blood glucose and develop strategies to address psychosocial issues . Identify diabetes complications and ways to prevent them  Explain how to manage diabetes during illness . Evaluate success in meeting personal goal . Establish 2-3 goals that they will plan to diligently work on until they return for the  3-month follow-up visit  Goals:   I will take my diabetes medications as scheduled  I will test my glucose at least 3 times a week  I will look at patterns in my record book at least 4 days a month  Your patient has identified these potential barriers to change:  Motivation  Your patient has identified their diabetes self-care support plan as  [facility] Support Group  Plan:  Attend Support Group as desired

## 2016-06-13 NOTE — Progress Notes (Signed)

## 2016-06-25 DIAGNOSIS — D0339 Melanoma in situ of other parts of face: Secondary | ICD-10-CM | POA: Diagnosis not present

## 2016-07-12 ENCOUNTER — Ambulatory Visit (INDEPENDENT_AMBULATORY_CARE_PROVIDER_SITE_OTHER): Payer: Medicare Other | Admitting: Family Medicine

## 2016-07-12 ENCOUNTER — Encounter: Payer: Self-pay | Admitting: Family Medicine

## 2016-07-12 DIAGNOSIS — E109 Type 1 diabetes mellitus without complications: Secondary | ICD-10-CM | POA: Diagnosis not present

## 2016-07-12 NOTE — Progress Notes (Signed)
Medical Nutrition Therapy:  Appt start time: 1430 end time:  [id number].  Assessment:  Primary concerns today: Weight management and Blood sugar control.  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  He has lost 17 lb since dx in June.  He was accompanied today by his sister-in-law Mady Gemma.    Learning Readiness: Change in progress; has mostly eliminated soda and sweet tea and other sweets.  Is now eating 3 meals a day (used to skip lunch).    Usual eating pattern includes 3 meals and 0-1 snacks per day. Frequent foods and beverages include meatloaf, chx, fish (1 X wk), water, coffee w/ sk milk & Truvia, unswt tea, takeout or rest food 3-4 X wk.  Avoided foods include most soda (other than diet), [employer] steak sandwiches, sour cream, spinach (doesn't like).   Usual physical activity includes [employer] cardio ex, low-intensity, usually ~10-18 min on all three, elliptical, bike, and TM 1-4 X wk.  24-hr recall: (Up at 10 AM) B (10 AM)-   1 c Sp K, 1/2 banana, 1/2 c sk milk, 1 c coffee with sk milk & Truvia Snk ( AM)-   --- L (1:30 PM)-  2 oz chx brst, salad, cheese, 1 T drsng, 1 c coffee with sk milk & Truvia Snk (4:30)-  12 oz [employer] vanilla diet coffee w/ sk milk  D (6:30 PM)-  6 oz filet mignon, 1 small baked potato, 1 t butter, 1 c broccoli, unswt tea Snk ( PM)-  --- Typical day? Yes.    Progress Towards Goal(s):  In progress.   Nutritional Diagnosis:  Progress on: NI-5.8.2 Excessive carbohydrate intake As related to beverages.  As evidenced by only occasional soda and no sweet tea consumed now.    Intervention:  Nutrition Education.  Handouts given during visit include:  AVS  BG log  Demonstrated degree of understanding via:  Teach Back; pt demonstrated good understanding of how to choose foods to fit the template of pro, CHO, and veg's, and in appropriate proportions.    Barriers to learning/adherence to lifestyle change: Cognitive deficit; sister-in-law  helps out with care, but Mr. Wambach is pretty high-functioning.    Monitoring/Evaluation:  Dietary intake, exercise, FBG, and body weight prn.

## 2016-07-12 NOTE — Patient Instructions (Addendum)
-   If you REALLY want some fruit juice, you may want to try mixing fruit juice with seltzer.   - Check your blood sugar only on 3 days a week   - BEFORE you eat breakfast (and 2 hours after)  - BEFORE you eat dinner (and 2 hours after)  - Bring your blood sugar log to your Sept 5 appt with Dr. Dema Severin.     Diet Recommendations for Diabetes   Carbohydrate we eat raises blood sugar levels.  Carbohydrate includes starch, sugar, and fiber.  Only sugar and starch raise blood sugar levels, though.    Starchy (carb) foods: Bread, rice, pasta, potatoes, corn, cereal, grits, crackers, bagels, muffins, all baked goods.  (Fruits, milk, and yogurt also have carbohydrate, but most of these foods will not spike your blood sugar as the starchy foods will.)  A few fruits do cause high blood sugars; use small portions of bananas (limit to 1/2 at a time), grapes, watermelon, oranges, and most tropical fruits.    Protein foods: Meat, fish, poultry, eggs, dairy foods, and beans such as pinto and kidney beans (beans also provide carbohydrate).   1. Eat at least 3 meals and 1-2 snacks per day. Never go more than 4-5 hours while awake without eating. Eat breakfast within the first hour of getting up.   2. Limit starchy foods to TWO per meal and ONE per snack. ONE portion of a starchy  food is equal to the following:   - ONE slice of bread (or its equivalent, such as half of a hamburger bun).   - 1/2 cup of a "scoopable" starchy food such as potatoes or rice.   - 15 grams of total carbohydrate as shown on food label.  3. Include at every meal: a protein food, a carb food, and vegetables and/or fruit.   - Obtain twice the volume of veg's as protein or carbohydrate foods for both lunch and dinner.   - Fresh or frozen veg's are best.   - Keep frozen veg's on hand for a quick vegetable serving.     - Your [employer] exercise:  A great routine would include some weight training (especially lower body) 2-3 times a week, plus your  current exercise you're doing.  Also good would be to supplement your [employer] visits with walking 2-3 X wk.    Planning your meals:  Make a list of Protein Foods, Starchy Foods, and Vegetables.  Keep this list on hand to use as a basis for shopping as well as to mix and match components for meals.  Call for a follow-up appt if you need more help with this.    Specific Foods: - Apple sauce:  Choose the one without added sugar; consider a serving to be 1/2 to 1 full cup.   - Raisin bread:  Most of these are quite sweet.  You can get an idea of how much sugar is added by looking at the label:  Each 1 teaspoon of sugar = 4 grams of sugar.   - With respect to controlling blood sugar, we need to look at overall dietary balance, not keeping a lot of foods completely out of the diet (which means a slice of raisin bread now and then is FINE).    If you have question, don't hesitate to call Dr. Jenne Campus at [phone].

## 2016-07-25 ENCOUNTER — Ambulatory Visit (INDEPENDENT_AMBULATORY_CARE_PROVIDER_SITE_OTHER): Payer: Medicare Other | Admitting: Podiatry

## 2016-07-25 ENCOUNTER — Encounter: Payer: Self-pay | Admitting: Podiatry

## 2016-07-25 DIAGNOSIS — B351 Tinea unguium: Secondary | ICD-10-CM | POA: Diagnosis not present

## 2016-07-25 DIAGNOSIS — E1149 Type 2 diabetes mellitus with other diabetic neurological complication: Secondary | ICD-10-CM | POA: Diagnosis not present

## 2016-07-25 DIAGNOSIS — M79673 Pain in unspecified foot: Secondary | ICD-10-CM

## 2016-07-25 NOTE — Progress Notes (Signed)
Patient ID: Jay Bailey, male   DOB: September 10, 1941, 75 y.o.   MRN: [id number] Complaint:  Visit Type: Patient returns to my office for continued preventative foot care services. Complaint: Patient states" my nails have grown long and thick and become painful to walk and wear shoes" Patient has been diagnosed with DM with neuropathy.Marland Kitchen He presents for preventative foot care services. No changes to ROS.  Painful third toenail right foot.  Podiatric Exam: Vascular: dorsalis pedis and posterior tibial pulses are palpable bilateral. Capillary return is immediate. Temperature gradient is WNL. Skin turgor WNL  Sensorium: Normal Semmes Weinstein monofilament test. Normal tactile sensation bilaterally. Nail Exam: Pt has thick disfigured discolored nails with subungual debris noted bilateral entire nail hallux through fifth toenails.  Third toe right foot self avulsed. Ulcer Exam: There is no evidence of ulcer or pre-ulcerative changes or infection. Orthopedic Exam: Muscle tone and strength are WNL. No limitations in general ROM. No crepitus or effusions noted. Foot type and digits show no abnormalities. 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 Skin: No Porokeratosis. No infection or ulcers  Diagnosis:  Tinea unguium, Pain in right toe, pain in left toes  Treatment & Plan Procedures and Treatment: Consent by patient was obtained for treatment procedures. The patient understood the discussion of treatment and procedures well. All questions were answered thoroughly reviewed. Debridement of mycotic and hypertrophic toenails, 1 through 5 bilateral and clearing of subungual debris. No ulceration, no infection noted. To consider diabetic shoes. Neosporin/DSD third toe right foot. Return Visit-Office Procedure: Patient instructed to return to the office for a follow up visit 3 months for continued evaluation and treatment.   Gardiner Barefoot DPM

## 2016-08-07 DIAGNOSIS — E1151 Type 2 diabetes mellitus with diabetic peripheral angiopathy without gangrene: Secondary | ICD-10-CM | POA: Diagnosis not present

## 2016-08-07 DIAGNOSIS — E785 Hyperlipidemia, unspecified: Secondary | ICD-10-CM | POA: Diagnosis not present

## 2016-08-07 DIAGNOSIS — I10 Essential (primary) hypertension: Secondary | ICD-10-CM | POA: Diagnosis not present

## 2016-08-07 DIAGNOSIS — E6609 Other obesity due to excess calories: Secondary | ICD-10-CM | POA: Diagnosis not present

## 2016-08-29 DIAGNOSIS — E119 Type 2 diabetes mellitus without complications: Secondary | ICD-10-CM | POA: Diagnosis not present

## 2016-08-29 DIAGNOSIS — H524 Presbyopia: Secondary | ICD-10-CM | POA: Diagnosis not present

## 2016-09-06 ENCOUNTER — Encounter (HOSPITAL_COMMUNITY): Payer: Self-pay | Admitting: Emergency Medicine

## 2016-09-06 ENCOUNTER — Emergency Department (HOSPITAL_COMMUNITY)
Admission: EM | Admit: 2016-09-06 | Discharge: 2016-09-06 | Disposition: A | Discharge status: Home / Self Care | Payer: Medicare Other | Attending: Emergency Medicine | Admitting: Emergency Medicine

## 2016-09-06 DIAGNOSIS — Z859 Personal history of malignant neoplasm, unspecified: Secondary | ICD-10-CM | POA: Insufficient documentation

## 2016-09-06 DIAGNOSIS — R05 Cough: Secondary | ICD-10-CM | POA: Diagnosis not present

## 2016-09-06 DIAGNOSIS — Z7984 Long term (current) use of oral hypoglycemic drugs: Secondary | ICD-10-CM | POA: Insufficient documentation

## 2016-09-06 DIAGNOSIS — Z7982 Long term (current) use of aspirin: Secondary | ICD-10-CM | POA: Diagnosis not present

## 2016-09-06 DIAGNOSIS — J069 Acute upper respiratory infection, unspecified: Secondary | ICD-10-CM | POA: Insufficient documentation

## 2016-09-06 DIAGNOSIS — E119 Type 2 diabetes mellitus without complications: Secondary | ICD-10-CM | POA: Insufficient documentation

## 2016-09-06 NOTE — ED Triage Notes (Signed)
Patient reports cough which started yesterday. Patient also reports sinus congestion.  Reports coughing up phlegm.  Denies fevers.

## 2016-09-06 NOTE — ED Provider Notes (Signed)
[facility] DEPT Provider Note   CSN: [id number] Arrival date & time: 09/06/16  2142     History   Chief Complaint Chief Complaint  Patient presents with  . Cough  . Nasal Congestion    HPI Jay Bailey is a 75 y.o. male.  75 year old male presents with 48 hour history of cough and congestion and URI symptoms. Cough productive of phlegm with sinus congestion. Denies any dyspnea. No anginal or CHF type symptoms. No neck pain or photophobia.Headache. Does note positive sick exposures. No reported fevers. Using over-the-counter medication with some relief.      Past Medical History:  Diagnosis Date  . Cancer ([place])   . Diabetes mellitus without complication ([place])   . Hyperlipidemia     There are no active problems to display for this patient.   Past Surgical History:  Procedure Laterality Date  . nose myeloma         Home Medications    Prior to Admission medications   Medication Sig Start Date End Date Taking? Authorizing Provider  amLODipine (NORVASC) 5 MG tablet  02/08/14   Historical Provider, MD  amLODipine-benazepril (LOTREL) 5-10 MG per capsule Take 1 capsule by mouth daily.    Historical Provider, MD  ascorbic acid (VITAMIN C) 1000 MG tablet Take 1,000 mg by mouth daily.    Historical Provider, MD  aspirin 81 MG tablet Take 81 mg by mouth daily.    Historical Provider, MD  atorvastatin (LIPITOR) 80 MG tablet Take 80 mg by mouth daily.    Historical Provider, MD  folic acid (FOLVITE) Q000111Q MCG tablet Take 400 mcg by mouth daily.    Historical Provider, MD  Glucosamine 750 MG TABS Take 1,500 mg by mouth.    Historical Provider, MD  irbesartan (AVAPRO) 300 MG tablet Take 300 mg by mouth daily.    Historical Provider, MD  metFORMIN (GLUCOPHAGE) 500 MG tablet Take by mouth daily after supper.    Historical Provider, MD  Multiple Vitamin (MULTIVITAMIN) tablet Take 1 tablet by mouth daily.    Historical Provider, MD  polyethylene glycol powder (GLYCOLAX/MIRALAX)  powder  02/01/14   Historical Provider, MD  solifenacin (VESICARE) 10 MG tablet Take 10 mg by mouth daily.    Historical Provider, MD  vitamin E 400 UNIT capsule Take 400 Units by mouth daily.    Historical Provider, MD    Family History History reviewed. No pertinent family history.  Social History Social History  Substance Use Topics  . Smoking status: Never Smoker  . Smokeless tobacco: Never Used  . Alcohol use No     Allergies   Review of patient's allergies indicates no known allergies.   Review of Systems Review of Systems  All other systems reviewed and are negative.    Physical Exam Updated Vital Signs BP 129/82 (BP Location: Right Arm)   Pulse 71   Temp 98.4 F (36.9 C) (Oral)   Resp 16   Ht 5\' 7"  (1.702 m)   Wt 87.5 kg   SpO2 95%   BMI 30.23 kg/m   Physical Exam  Constitutional: He is oriented to person, place, and time. He appears well-developed and well-nourished.  Non-toxic appearance. No distress.  HENT:  Head: Normocephalic and atraumatic.  Eyes: Conjunctivae, EOM and lids are normal. Pupils are equal, round, and reactive to light.  Neck: Normal range of motion. Neck supple. No tracheal deviation present. No thyroid mass present.  Cardiovascular: Normal rate, regular rhythm and normal heart sounds.  Exam  reveals no gallop.   No murmur heard. Pulmonary/Chest: Effort normal and breath sounds normal. No stridor. No respiratory distress. He has no decreased breath sounds. He has no wheezes. He has no rhonchi. He has no rales.  Abdominal: Soft. Normal appearance and bowel sounds are normal. He exhibits no distension. There is no tenderness. There is no rebound and no CVA tenderness.  Musculoskeletal: Normal range of motion. He exhibits no edema or tenderness.  Neurological: He is alert and oriented to person, place, and time. He has normal strength. No cranial nerve deficit or sensory deficit. GCS eye subscore is 4. GCS verbal subscore is 5. GCS motor  subscore is 6.  Skin: Skin is warm and dry. No abrasion and no rash noted.  Psychiatric: He has a normal mood and affect. His speech is normal and behavior is normal.  Nursing note and vitals reviewed.    ED Treatments / Results  Labs (all labs ordered are listed, but only abnormal results are displayed) Labs Reviewed - No data to display  EKG  EKG Interpretation None       Radiology No results found.  Procedures Procedures (including critical care time)  Medications Ordered in ED Medications - No data to display   Initial Impression / Assessment and Plan / ED Course  I have reviewed the triage vital signs and the nursing notes.  Pertinent labs & imaging results that were available during my care of the patient were reviewed by me and considered in my medical decision making (see chart for details).  Clinical Course    Patient likely viral URI. He is not hypoxic. Size is stable. Return precautions given  Final Clinical Impressions(s) / ED Diagnoses   Final diagnoses:  Viral upper respiratory tract infection    New Prescriptions New Prescriptions   No medications on file     Lacretia Leigh, MD 09/06/16 2245

## 2016-09-06 NOTE — Discharge Instructions (Signed)
Return here at once for fever, trouble breathing, or any other problems

## 2016-09-20 DIAGNOSIS — E1151 Type 2 diabetes mellitus with diabetic peripheral angiopathy without gangrene: Secondary | ICD-10-CM | POA: Diagnosis not present

## 2016-09-20 DIAGNOSIS — K5900 Constipation, unspecified: Secondary | ICD-10-CM | POA: Diagnosis not present

## 2016-09-26 DIAGNOSIS — C61 Malignant neoplasm of prostate: Secondary | ICD-10-CM | POA: Diagnosis not present

## 2016-10-06 DIAGNOSIS — J209 Acute bronchitis, unspecified: Secondary | ICD-10-CM | POA: Diagnosis not present

## 2016-10-16 DIAGNOSIS — Z23 Encounter for immunization: Secondary | ICD-10-CM | POA: Diagnosis not present

## 2016-10-16 DIAGNOSIS — B356 Tinea cruris: Secondary | ICD-10-CM | POA: Diagnosis not present

## 2016-10-16 DIAGNOSIS — Z85820 Personal history of malignant melanoma of skin: Secondary | ICD-10-CM | POA: Diagnosis not present

## 2016-10-18 DIAGNOSIS — Z800 Family history of malignant neoplasm of digestive organs: Secondary | ICD-10-CM | POA: Diagnosis not present

## 2016-10-18 DIAGNOSIS — K5900 Constipation, unspecified: Secondary | ICD-10-CM | POA: Diagnosis not present

## 2016-10-22 DIAGNOSIS — Z23 Encounter for immunization: Secondary | ICD-10-CM | POA: Diagnosis not present

## 2016-10-24 ENCOUNTER — Encounter: Payer: Self-pay | Admitting: Podiatry

## 2016-10-24 ENCOUNTER — Ambulatory Visit (INDEPENDENT_AMBULATORY_CARE_PROVIDER_SITE_OTHER): Payer: Medicare Other | Admitting: Podiatry

## 2016-10-24 VITALS — Ht 67.0 in | Wt 193.0 lb

## 2016-10-24 DIAGNOSIS — B351 Tinea unguium: Secondary | ICD-10-CM | POA: Diagnosis not present

## 2016-10-24 DIAGNOSIS — M79676 Pain in unspecified toe(s): Secondary | ICD-10-CM

## 2016-10-24 NOTE — Progress Notes (Signed)
Patient ID: Jay Bailey, male   DOB: [birth year], 75 y.o.   MRN: [id number] Complaint:  Visit Type: Patient returns to my office for continued preventative foot care services. Complaint: Patient states" my nails have grown long and thick and become painful to walk and wear shoes" Patient has been diagnosed with DM with neuropathy.Marland Kitchen He presents for preventative foot care services. No changes to ROS.  Painful third toenail right foot.  Podiatric Exam: Vascular: dorsalis pedis and posterior tibial pulses are palpable bilateral. Capillary return is immediate. Temperature gradient is WNL. Skin turgor WNL  Sensorium: Normal Semmes Weinstein monofilament test. Normal tactile sensation bilaterally. Nail Exam: Pt has thick disfigured discolored nails with subungual debris noted bilateral entire nail hallux through fifth toenails.  Third toe right foot self avulsed. Ulcer Exam: There is no evidence of ulcer or pre-ulcerative changes or infection. Orthopedic Exam: Muscle tone and strength are WNL. No limitations in general ROM. No crepitus or effusions noted. Foot type and digits show no abnormalities. 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 Skin: No Porokeratosis. No infection or ulcers  Diagnosis:  Tinea unguium, Pain in right toe, pain in left toes  Treatment & Plan Procedures and Treatment: Consent by patient was obtained for treatment procedures. The patient understood the discussion of treatment and procedures well. All questions were answered thoroughly reviewed. Debridement of mycotic and hypertrophic toenails, 1 through 5 bilateral and clearing of subungual debris. No ulceration, no infection noted. To consider diabetic shoes. Neosporin/DSD third toe right foot. Return Visit-Office Procedure: Patient instructed to return to the office for a follow up visit 3 months for continued evaluation and treatment.   Gardiner Barefoot DPM

## 2016-11-05 DIAGNOSIS — C61 Malignant neoplasm of prostate: Secondary | ICD-10-CM | POA: Diagnosis not present

## 2016-11-05 DIAGNOSIS — N3941 Urge incontinence: Secondary | ICD-10-CM | POA: Diagnosis not present

## 2016-11-06 DIAGNOSIS — K5900 Constipation, unspecified: Secondary | ICD-10-CM | POA: Diagnosis not present

## 2016-11-06 DIAGNOSIS — E785 Hyperlipidemia, unspecified: Secondary | ICD-10-CM | POA: Diagnosis not present

## 2016-11-06 DIAGNOSIS — I10 Essential (primary) hypertension: Secondary | ICD-10-CM | POA: Diagnosis not present

## 2016-11-06 DIAGNOSIS — E1151 Type 2 diabetes mellitus with diabetic peripheral angiopathy without gangrene: Secondary | ICD-10-CM | POA: Diagnosis not present

## 2016-11-06 DIAGNOSIS — N3281 Overactive bladder: Secondary | ICD-10-CM | POA: Diagnosis not present

## 2016-11-14 DIAGNOSIS — Z800 Family history of malignant neoplasm of digestive organs: Secondary | ICD-10-CM | POA: Diagnosis not present

## 2016-11-14 DIAGNOSIS — Z1211 Encounter for screening for malignant neoplasm of colon: Secondary | ICD-10-CM | POA: Diagnosis not present

## 2016-11-14 DIAGNOSIS — K5730 Diverticulosis of large intestine without perforation or abscess without bleeding: Secondary | ICD-10-CM | POA: Diagnosis not present

## 2017-01-07 DIAGNOSIS — H6123 Impacted cerumen, bilateral: Secondary | ICD-10-CM | POA: Diagnosis not present

## 2017-01-07 DIAGNOSIS — H903 Sensorineural hearing loss, bilateral: Secondary | ICD-10-CM | POA: Diagnosis not present

## 2017-01-10 ENCOUNTER — Ambulatory Visit (INDEPENDENT_AMBULATORY_CARE_PROVIDER_SITE_OTHER): Payer: Medicare Other | Admitting: Podiatry

## 2017-01-10 ENCOUNTER — Encounter: Payer: Self-pay | Admitting: Podiatry

## 2017-01-10 VITALS — Ht 67.0 in | Wt 193.0 lb

## 2017-01-10 DIAGNOSIS — B351 Tinea unguium: Secondary | ICD-10-CM | POA: Diagnosis not present

## 2017-01-10 DIAGNOSIS — M79676 Pain in unspecified toe(s): Secondary | ICD-10-CM

## 2017-01-10 NOTE — Progress Notes (Signed)
Patient ID: Jay Bailey, male   DOB: Apr 17, 1941, 76 y.o.   MRN: [id number] Complaint:  Visit Type: Patient returns to my office for continued preventative foot care services. Complaint: Patient states" my nails have grown long and thick and become painful to walk and wear shoes" Patient has been diagnosed with DM with neuropathy.Marland Kitchen He presents for preventative foot care services. No changes to ROS.  Painful third toenail right foot.  Podiatric Exam: Vascular: dorsalis pedis and posterior tibial pulses are palpable bilateral. Capillary return is immediate. Temperature gradient is WNL. Skin turgor WNL  Sensorium: Normal Semmes Weinstein monofilament test. Normal tactile sensation bilaterally. Nail Exam: Pt has thick disfigured discolored nails with subungual debris noted bilateral entire nail hallux through fifth toenails.  Third toe right foot self avulsed. Ulcer Exam: There is no evidence of ulcer or pre-ulcerative changes or infection. Orthopedic Exam: Muscle tone and strength are WNL. No limitations in general ROM. No crepitus or effusions noted. Foot type and digits show no abnormalities. 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 Skin: No Porokeratosis. No infection or ulcers. Healing fissure on left heel.  Diagnosis:  Tinea unguium, Pain in right toe, pain in left toes  Treatment & Plan Procedures and Treatment: Consent by patient was obtained for treatment procedures. The patient understood the discussion of treatment and procedures well. All questions were answered thoroughly reviewed. Debridement of mycotic and hypertrophic toenails, 1 through 5 bilateral and clearing of subungual debris. No ulceration, no infection noted. Told patient to apply vaseline to fissure. Return Visit-Office Procedure: Patient instructed to return to the office for a follow up visit 3 months for continued evaluation and treatment.   Gardiner Barefoot DPM

## 2017-01-18 DIAGNOSIS — Z471 Aftercare following joint replacement surgery: Secondary | ICD-10-CM | POA: Diagnosis not present

## 2017-01-18 DIAGNOSIS — Z96641 Presence of right artificial hip joint: Secondary | ICD-10-CM | POA: Diagnosis not present

## 2017-01-18 DIAGNOSIS — Z96642 Presence of left artificial hip joint: Secondary | ICD-10-CM | POA: Diagnosis not present

## 2017-01-18 DIAGNOSIS — Z96643 Presence of artificial hip joint, bilateral: Secondary | ICD-10-CM | POA: Diagnosis not present

## 2017-02-04 DIAGNOSIS — C61 Malignant neoplasm of prostate: Secondary | ICD-10-CM | POA: Diagnosis not present

## 2017-02-11 DIAGNOSIS — E785 Hyperlipidemia, unspecified: Secondary | ICD-10-CM | POA: Diagnosis not present

## 2017-02-11 DIAGNOSIS — I10 Essential (primary) hypertension: Secondary | ICD-10-CM | POA: Diagnosis not present

## 2017-02-11 DIAGNOSIS — E1151 Type 2 diabetes mellitus with diabetic peripheral angiopathy without gangrene: Secondary | ICD-10-CM | POA: Diagnosis not present

## 2017-02-11 DIAGNOSIS — Z7984 Long term (current) use of oral hypoglycemic drugs: Secondary | ICD-10-CM | POA: Diagnosis not present

## 2017-02-11 DIAGNOSIS — C61 Malignant neoplasm of prostate: Secondary | ICD-10-CM | POA: Diagnosis not present

## 2017-02-21 DIAGNOSIS — M1811 Unilateral primary osteoarthritis of first carpometacarpal joint, right hand: Secondary | ICD-10-CM | POA: Diagnosis not present

## 2017-04-04 ENCOUNTER — Encounter: Payer: Self-pay | Admitting: Podiatry

## 2017-04-04 ENCOUNTER — Ambulatory Visit (INDEPENDENT_AMBULATORY_CARE_PROVIDER_SITE_OTHER): Payer: Medicare Other | Admitting: Podiatry

## 2017-04-04 DIAGNOSIS — M79676 Pain in unspecified toe(s): Secondary | ICD-10-CM

## 2017-04-04 DIAGNOSIS — B351 Tinea unguium: Secondary | ICD-10-CM

## 2017-04-04 DIAGNOSIS — E1149 Type 2 diabetes mellitus with other diabetic neurological complication: Secondary | ICD-10-CM

## 2017-04-04 NOTE — Progress Notes (Signed)
Patient ID: Jay Bailey, male   DOB: [birth year], 76 y.o.   MRN: [id number] Complaint:  Visit Type: Patient returns to my office for continued preventative foot care services. Complaint: Patient states" my nails have grown long and thick and become painful to walk and wear shoes" Patient has been diagnosed with DM with neuropathy.Marland Kitchen He presents for preventative foot care services. No changes to ROS.   Podiatric Exam: Vascular: dorsalis pedis and posterior tibial pulses are palpable bilateral. Capillary return is immediate. Temperature gradient is WNL. Skin turgor WNL  Sensorium: Normal Semmes Weinstein monofilament test. Normal tactile sensation bilaterally. Nail Exam: Pt has thick disfigured discolored nails with subungual debris noted bilateral entire nail hallux through fifth toenails.  Third toe right foot self avulsed. Ulcer Exam: There is no evidence of ulcer or pre-ulcerative changes or infection. Orthopedic Exam: Muscle tone and strength are WNL. No limitations in general ROM. No crepitus or effusions noted. Foot type and digits show no abnormalities. 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 Skin: No Porokeratosis. No infection or ulcers. Healing fissure on left heel.  Diagnosis:  Tinea unguium, Pain in right toe, pain in left toes  Treatment & Plan Procedures and Treatment: Consent by patient was obtained for treatment procedures. The patient understood the discussion of treatment and procedures well. All questions were answered thoroughly reviewed. Debridement of mycotic and hypertrophic toenails, 1 through 5 bilateral and clearing of subungual debris. No ulceration, no infection noted. Told patient to apply vaseline to fissure. Return Visit-Office Procedure: Patient instructed to return to the office for a follow up visit 3 months for continued evaluation and treatment.   Gardiner Barefoot DPM

## 2017-04-16 DIAGNOSIS — H6123 Impacted cerumen, bilateral: Secondary | ICD-10-CM | POA: Diagnosis not present

## 2017-04-16 DIAGNOSIS — H903 Sensorineural hearing loss, bilateral: Secondary | ICD-10-CM | POA: Diagnosis not present

## 2017-05-13 DIAGNOSIS — C61 Malignant neoplasm of prostate: Secondary | ICD-10-CM | POA: Diagnosis not present

## 2017-05-13 DIAGNOSIS — I10 Essential (primary) hypertension: Secondary | ICD-10-CM | POA: Diagnosis not present

## 2017-05-13 DIAGNOSIS — Z0000 Encounter for general adult medical examination without abnormal findings: Secondary | ICD-10-CM | POA: Diagnosis not present

## 2017-05-13 DIAGNOSIS — E785 Hyperlipidemia, unspecified: Secondary | ICD-10-CM | POA: Diagnosis not present

## 2017-05-13 DIAGNOSIS — R202 Paresthesia of skin: Secondary | ICD-10-CM | POA: Diagnosis not present

## 2017-05-13 DIAGNOSIS — N3281 Overactive bladder: Secondary | ICD-10-CM | POA: Diagnosis not present

## 2017-05-13 DIAGNOSIS — Z7984 Long term (current) use of oral hypoglycemic drugs: Secondary | ICD-10-CM | POA: Diagnosis not present

## 2017-05-13 DIAGNOSIS — Z23 Encounter for immunization: Secondary | ICD-10-CM | POA: Diagnosis not present

## 2017-05-13 DIAGNOSIS — E1151 Type 2 diabetes mellitus with diabetic peripheral angiopathy without gangrene: Secondary | ICD-10-CM | POA: Diagnosis not present

## 2017-05-17 DIAGNOSIS — C61 Malignant neoplasm of prostate: Secondary | ICD-10-CM | POA: Diagnosis not present

## 2017-05-27 DIAGNOSIS — R9721 Rising PSA following treatment for malignant neoplasm of prostate: Secondary | ICD-10-CM | POA: Diagnosis not present

## 2017-05-27 DIAGNOSIS — N3941 Urge incontinence: Secondary | ICD-10-CM | POA: Diagnosis not present

## 2017-06-18 DIAGNOSIS — D225 Melanocytic nevi of trunk: Secondary | ICD-10-CM | POA: Diagnosis not present

## 2017-06-18 DIAGNOSIS — D2272 Melanocytic nevi of left lower limb, including hip: Secondary | ICD-10-CM | POA: Diagnosis not present

## 2017-06-18 DIAGNOSIS — Z85820 Personal history of malignant melanoma of skin: Secondary | ICD-10-CM | POA: Diagnosis not present

## 2017-06-18 DIAGNOSIS — L821 Other seborrheic keratosis: Secondary | ICD-10-CM | POA: Diagnosis not present

## 2017-06-20 ENCOUNTER — Ambulatory Visit (INDEPENDENT_AMBULATORY_CARE_PROVIDER_SITE_OTHER): Payer: Medicare Other | Admitting: Podiatry

## 2017-06-20 ENCOUNTER — Encounter: Payer: Self-pay | Admitting: Podiatry

## 2017-06-20 DIAGNOSIS — M79676 Pain in unspecified toe(s): Secondary | ICD-10-CM | POA: Diagnosis not present

## 2017-06-20 DIAGNOSIS — B351 Tinea unguium: Secondary | ICD-10-CM | POA: Diagnosis not present

## 2017-06-20 DIAGNOSIS — E1149 Type 2 diabetes mellitus with other diabetic neurological complication: Secondary | ICD-10-CM

## 2017-06-20 NOTE — Progress Notes (Signed)
Patient ID: Jay Bailey, male   DOB: 1941/11/13, 76 y.o.   MRN: [id number] Complaint:  Visit Type: Patient returns to my office for continued preventative foot care services. Complaint: Patient states" my nails have grown long and thick and become painful to walk and wear shoes" Patient has been diagnosed with DM with neuropathy.Marland Kitchen He presents for preventative foot care services. No changes to ROS.   Podiatric Exam: Vascular: dorsalis pedis and posterior tibial pulses are palpable bilateral. Capillary return is immediate. Temperature gradient is WNL. Skin turgor WNL  Sensorium: Normal Semmes Weinstein monofilament test. Normal tactile sensation bilaterally. Nail Exam: Pt has thick disfigured discolored nails with subungual debris noted bilateral entire nail hallux through fifth toenails.  Third toe right foot self avulsed. Ulcer Exam: There is no evidence of ulcer or pre-ulcerative changes or infection. Orthopedic Exam: Muscle tone and strength are WNL. No limitations in general ROM. No crepitus or effusions noted. Foot type and digits show no abnormalities. 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 Skin: No Porokeratosis. No infection or ulcers. Healing fissure on left heel.  Diagnosis:  Tinea unguium, Pain in right toe, pain in left toes  Treatment & Plan Procedures and Treatment: Consent by patient was obtained for treatment procedures. The patient understood the discussion of treatment and procedures well. All questions were answered thoroughly reviewed. Debridement of mycotic and hypertrophic toenails, 1 through 5 bilateral and clearing of subungual debris. No ulceration, no infection noted.  Return Visit-Office Procedure: Patient instructed to return to the office for a follow up visit 3 months for continued evaluation and treatment.   Gardiner Barefoot DPM

## 2017-07-09 ENCOUNTER — Ambulatory Visit: Payer: Medicare Other | Admitting: Podiatry

## 2017-07-16 DIAGNOSIS — L304 Erythema intertrigo: Secondary | ICD-10-CM | POA: Diagnosis not present

## 2017-07-22 DIAGNOSIS — H903 Sensorineural hearing loss, bilateral: Secondary | ICD-10-CM | POA: Diagnosis not present

## 2017-07-22 DIAGNOSIS — H6123 Impacted cerumen, bilateral: Secondary | ICD-10-CM | POA: Diagnosis not present

## 2017-08-08 DIAGNOSIS — Z23 Encounter for immunization: Secondary | ICD-10-CM | POA: Diagnosis not present

## 2017-08-08 DIAGNOSIS — L304 Erythema intertrigo: Secondary | ICD-10-CM | POA: Diagnosis not present

## 2017-08-28 ENCOUNTER — Ambulatory Visit (INDEPENDENT_AMBULATORY_CARE_PROVIDER_SITE_OTHER): Payer: Medicare Other | Admitting: Podiatry

## 2017-08-28 ENCOUNTER — Encounter: Payer: Self-pay | Admitting: Podiatry

## 2017-08-28 DIAGNOSIS — B351 Tinea unguium: Secondary | ICD-10-CM

## 2017-08-28 DIAGNOSIS — M79676 Pain in unspecified toe(s): Secondary | ICD-10-CM

## 2017-08-28 NOTE — Progress Notes (Signed)
Patient ID: Jay Bailey, male   DOB: 09/05/1941, 76 y.o.   MRN: [id number] Complaint:  Visit Type: Patient returns to my office for continued preventative foot care services. Complaint: Patient states" my nails have grown long and thick and become painful to walk and wear shoes" Patient has been diagnosed with DM with neuropathy.Marland Kitchen He presents for preventative foot care services. No changes to ROS  Podiatric Exam: Vascular: dorsalis pedis and posterior tibial pulses are palpable bilateral. Capillary return is immediate. Temperature gradient is WNL. Skin turgor WNL  Sensorium: Normal Semmes Weinstein monofilament test. Normal tactile sensation bilaterally. Nail Exam: Pt has thick disfigured discolored nails with subungual debris noted bilateral entire nail hallux through fifth toenails Ulcer Exam: There is no evidence of ulcer or pre-ulcerative changes or infection. Orthopedic Exam: Muscle tone and strength are WNL. No limitations in general ROM. No crepitus or effusions noted. Foot type and digits show no abnormalities. 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 Skin: No Porokeratosis. No infection or ulcers  Diagnosis:  Tinea unguium, Pain in right toe, pain in left toes  Treatment & Plan Procedures and Treatment: Consent by patient was obtained for treatment procedures. The patient understood the discussion of treatment and procedures well. All questions were answered thoroughly reviewed. Debridement of mycotic and hypertrophic toenails, 1 through 5 bilateral and clearing of subungual debris. No ulceration, no infection noted.  Return Visit-Office Procedure: Patient instructed to return to the office for a follow up visit 10 weeks  for continued evaluation and treatment.

## 2017-08-29 DIAGNOSIS — H524 Presbyopia: Secondary | ICD-10-CM | POA: Diagnosis not present

## 2017-08-29 DIAGNOSIS — H2513 Age-related nuclear cataract, bilateral: Secondary | ICD-10-CM | POA: Diagnosis not present

## 2017-08-29 DIAGNOSIS — C61 Malignant neoplasm of prostate: Secondary | ICD-10-CM | POA: Diagnosis not present

## 2017-08-29 DIAGNOSIS — R972 Elevated prostate specific antigen [PSA]: Secondary | ICD-10-CM | POA: Diagnosis not present

## 2017-08-29 DIAGNOSIS — E119 Type 2 diabetes mellitus without complications: Secondary | ICD-10-CM | POA: Diagnosis not present

## 2017-09-16 DIAGNOSIS — C61 Malignant neoplasm of prostate: Secondary | ICD-10-CM | POA: Diagnosis not present

## 2017-09-16 DIAGNOSIS — I10 Essential (primary) hypertension: Secondary | ICD-10-CM | POA: Diagnosis not present

## 2017-09-16 DIAGNOSIS — Z7984 Long term (current) use of oral hypoglycemic drugs: Secondary | ICD-10-CM | POA: Diagnosis not present

## 2017-09-16 DIAGNOSIS — E785 Hyperlipidemia, unspecified: Secondary | ICD-10-CM | POA: Diagnosis not present

## 2017-09-16 DIAGNOSIS — E1151 Type 2 diabetes mellitus with diabetic peripheral angiopathy without gangrene: Secondary | ICD-10-CM | POA: Diagnosis not present

## 2017-09-19 DIAGNOSIS — L304 Erythema intertrigo: Secondary | ICD-10-CM | POA: Diagnosis not present

## 2017-09-19 DIAGNOSIS — Z23 Encounter for immunization: Secondary | ICD-10-CM | POA: Diagnosis not present

## 2017-09-19 DIAGNOSIS — R208 Other disturbances of skin sensation: Secondary | ICD-10-CM | POA: Diagnosis not present

## 2017-10-02 DIAGNOSIS — M25561 Pain in right knee: Secondary | ICD-10-CM | POA: Diagnosis not present

## 2017-10-09 DIAGNOSIS — M25561 Pain in right knee: Secondary | ICD-10-CM | POA: Diagnosis not present

## 2017-10-18 DIAGNOSIS — M25561 Pain in right knee: Secondary | ICD-10-CM | POA: Diagnosis not present

## 2017-11-06 ENCOUNTER — Ambulatory Visit: Payer: Medicare Other | Admitting: Podiatry

## 2017-11-08 DIAGNOSIS — M25561 Pain in right knee: Secondary | ICD-10-CM | POA: Diagnosis not present

## 2017-11-15 ENCOUNTER — Telehealth: Payer: Self-pay | Admitting: *Deleted

## 2017-11-15 NOTE — Telephone Encounter (Signed)
Val, this should be for [place].

## 2017-11-15 NOTE — Telephone Encounter (Signed)
Pt called for an appt next week with Dr. Prudence Davidson to get toenails debrided.

## 2017-11-18 DIAGNOSIS — R9721 Rising PSA following treatment for malignant neoplasm of prostate: Secondary | ICD-10-CM | POA: Diagnosis not present

## 2017-11-18 DIAGNOSIS — R972 Elevated prostate specific antigen [PSA]: Secondary | ICD-10-CM | POA: Diagnosis not present

## 2017-11-18 DIAGNOSIS — C61 Malignant neoplasm of prostate: Secondary | ICD-10-CM | POA: Diagnosis not present

## 2017-11-18 DIAGNOSIS — N3946 Mixed incontinence: Secondary | ICD-10-CM | POA: Diagnosis not present

## 2017-11-19 DIAGNOSIS — K5901 Slow transit constipation: Secondary | ICD-10-CM | POA: Diagnosis not present

## 2017-12-04 ENCOUNTER — Encounter: Payer: Self-pay | Admitting: Podiatry

## 2017-12-04 ENCOUNTER — Ambulatory Visit (INDEPENDENT_AMBULATORY_CARE_PROVIDER_SITE_OTHER): Payer: Medicare Other | Admitting: Podiatry

## 2017-12-04 DIAGNOSIS — M79676 Pain in unspecified toe(s): Secondary | ICD-10-CM

## 2017-12-04 DIAGNOSIS — B351 Tinea unguium: Secondary | ICD-10-CM | POA: Diagnosis not present

## 2017-12-04 DIAGNOSIS — Z23 Encounter for immunization: Secondary | ICD-10-CM | POA: Diagnosis not present

## 2017-12-04 DIAGNOSIS — E1149 Type 2 diabetes mellitus with other diabetic neurological complication: Secondary | ICD-10-CM

## 2017-12-04 NOTE — Progress Notes (Addendum)
Patient ID: Jay Bailey, male   DOB: 06/22/1941, 77 y.o.   MRN: [id number] Complaint:  Visit Type: Patient returns to my office for continued preventative foot care services. Complaint: Patient states" my nails have grown long and thick and become painful to walk and wear shoes" Patient has been diagnosed with DM with neuropathy.Marland Kitchen He presents for preventative foot care services. No changes to ROS  Podiatric Exam: Vascular: dorsalis pedis and posterior tibial pulses are palpable bilateral. Capillary return is immediate. Temperature gradient is WNL. Skin turgor WNL  Sensorium: Normal Semmes Weinstein monofilament test. Normal tactile sensation bilaterally. Nail Exam: Pt has thick disfigured discolored nails with subungual debris noted bilateral entire nail hallux through fifth toenails Ulcer Exam: There is no evidence of ulcer or pre-ulcerative changes or infection. Orthopedic Exam: Muscle tone and strength are WNL. No limitations in general ROM. No crepitus or effusions noted. Foot type and digits show no abnormalities. 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 Skin: No Porokeratosis. No infection or ulcers  Diagnosis:  Tinea unguium, Pain in right toe, pain in left toes  Treatment & Plan Procedures and Treatment: Consent by patient was obtained for treatment procedures. The patient understood the discussion of treatment and procedures well. All questions were answered thoroughly reviewed. Debridement of mycotic and hypertrophic toenails, 1 through 5 bilateral and clearing of subungual debris. No ulceration, no infection noted. ABN signed for 2019. Return Visit-Office Procedure: Patient instructed to return to the office for a follow up visit 10 weeks  for continued evaluation and treatment.

## 2017-12-16 DIAGNOSIS — L309 Dermatitis, unspecified: Secondary | ICD-10-CM | POA: Diagnosis not present

## 2017-12-16 DIAGNOSIS — D2272 Melanocytic nevi of left lower limb, including hip: Secondary | ICD-10-CM | POA: Diagnosis not present

## 2017-12-16 DIAGNOSIS — Z23 Encounter for immunization: Secondary | ICD-10-CM | POA: Diagnosis not present

## 2017-12-16 DIAGNOSIS — L821 Other seborrheic keratosis: Secondary | ICD-10-CM | POA: Diagnosis not present

## 2017-12-16 DIAGNOSIS — Z85820 Personal history of malignant melanoma of skin: Secondary | ICD-10-CM | POA: Diagnosis not present

## 2018-01-08 DIAGNOSIS — H903 Sensorineural hearing loss, bilateral: Secondary | ICD-10-CM | POA: Diagnosis not present

## 2018-01-08 DIAGNOSIS — H6123 Impacted cerumen, bilateral: Secondary | ICD-10-CM | POA: Diagnosis not present

## 2018-02-14 DIAGNOSIS — E785 Hyperlipidemia, unspecified: Secondary | ICD-10-CM | POA: Diagnosis not present

## 2018-02-14 DIAGNOSIS — R234 Changes in skin texture: Secondary | ICD-10-CM | POA: Diagnosis not present

## 2018-02-14 DIAGNOSIS — E1140 Type 2 diabetes mellitus with diabetic neuropathy, unspecified: Secondary | ICD-10-CM | POA: Diagnosis not present

## 2018-02-14 DIAGNOSIS — I10 Essential (primary) hypertension: Secondary | ICD-10-CM | POA: Diagnosis not present

## 2018-02-14 DIAGNOSIS — E1151 Type 2 diabetes mellitus with diabetic peripheral angiopathy without gangrene: Secondary | ICD-10-CM | POA: Diagnosis not present

## 2018-02-14 DIAGNOSIS — C61 Malignant neoplasm of prostate: Secondary | ICD-10-CM | POA: Diagnosis not present

## 2018-02-17 DIAGNOSIS — N3946 Mixed incontinence: Secondary | ICD-10-CM | POA: Diagnosis not present

## 2018-02-17 DIAGNOSIS — C61 Malignant neoplasm of prostate: Secondary | ICD-10-CM | POA: Diagnosis not present

## 2018-02-17 DIAGNOSIS — R972 Elevated prostate specific antigen [PSA]: Secondary | ICD-10-CM | POA: Diagnosis not present

## 2018-03-04 ENCOUNTER — Ambulatory Visit: Payer: Medicare Other | Admitting: Podiatry

## 2018-03-14 ENCOUNTER — Ambulatory Visit: Payer: Medicare Other | Admitting: Podiatry

## 2018-04-02 ENCOUNTER — Ambulatory Visit (INDEPENDENT_AMBULATORY_CARE_PROVIDER_SITE_OTHER): Payer: Medicare Other | Admitting: Podiatry

## 2018-04-02 ENCOUNTER — Encounter: Payer: Self-pay | Admitting: Podiatry

## 2018-04-02 DIAGNOSIS — B351 Tinea unguium: Secondary | ICD-10-CM

## 2018-04-02 DIAGNOSIS — E1149 Type 2 diabetes mellitus with other diabetic neurological complication: Secondary | ICD-10-CM

## 2018-04-02 DIAGNOSIS — M79676 Pain in unspecified toe(s): Secondary | ICD-10-CM

## 2018-04-02 NOTE — Progress Notes (Signed)
Patient ID: Jay Bailey, male   DOB: 10/08/1941, 76 y.o.   MRN: [id number] Complaint:  Visit Type: Patient returns to my office for continued preventative foot care services. Complaint: Patient states" my nails have grown long and thick and become painful to walk and wear shoes" Patient has been diagnosed with DM with neuropathy.. He presents for preventative foot care services. No changes to ROS  Podiatric Exam: Vascular: dorsalis pedis and posterior tibial pulses are palpable bilateral. Capillary return is immediate. Temperature gradient is WNL. Skin turgor WNL  Sensorium: Normal Semmes Weinstein monofilament test. Normal tactile sensation bilaterally. Nail Exam: Pt has thick disfigured discolored nails with subungual debris noted bilateral entire nail hallux through fifth toenails Ulcer Exam: There is no evidence of ulcer or pre-ulcerative changes or infection. Orthopedic Exam: Muscle tone and strength are WNL. No limitations in general ROM. No crepitus or effusions noted. Foot type and digits show no abnormalities. 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 Skin: No Porokeratosis. No infection or ulcers  Diagnosis:  Tinea unguium, Pain in right toe, pain in left toes  Treatment & Plan Procedures and Treatment: Consent by patient was obtained for treatment procedures. The patient understood the discussion of treatment and procedures well. All questions were answered thoroughly reviewed. Debridement of mycotic and hypertrophic toenails, 1 through 5 bilateral and clearing of subungual debris. No ulceration, no infection noted. ABN signed for 2019. Return Visit-Office Procedure: Patient instructed to return to the office for a follow up visit 10 weeks  for continued evaluation and treatment.   Merryl Buckels DPM 

## 2018-04-22 DIAGNOSIS — H903 Sensorineural hearing loss, bilateral: Secondary | ICD-10-CM | POA: Diagnosis not present

## 2018-04-22 DIAGNOSIS — H6123 Impacted cerumen, bilateral: Secondary | ICD-10-CM | POA: Diagnosis not present

## 2018-05-23 DIAGNOSIS — R972 Elevated prostate specific antigen [PSA]: Secondary | ICD-10-CM | POA: Diagnosis not present

## 2018-05-23 DIAGNOSIS — R9721 Rising PSA following treatment for malignant neoplasm of prostate: Secondary | ICD-10-CM | POA: Diagnosis not present

## 2018-05-23 DIAGNOSIS — C61 Malignant neoplasm of prostate: Secondary | ICD-10-CM | POA: Diagnosis not present

## 2018-05-23 DIAGNOSIS — N3946 Mixed incontinence: Secondary | ICD-10-CM | POA: Diagnosis not present

## 2018-06-03 DIAGNOSIS — I10 Essential (primary) hypertension: Secondary | ICD-10-CM | POA: Diagnosis not present

## 2018-06-03 DIAGNOSIS — I739 Peripheral vascular disease, unspecified: Secondary | ICD-10-CM | POA: Diagnosis not present

## 2018-06-03 DIAGNOSIS — C61 Malignant neoplasm of prostate: Secondary | ICD-10-CM | POA: Diagnosis not present

## 2018-06-03 DIAGNOSIS — F325 Major depressive disorder, single episode, in full remission: Secondary | ICD-10-CM | POA: Diagnosis not present

## 2018-06-03 DIAGNOSIS — Z85820 Personal history of malignant melanoma of skin: Secondary | ICD-10-CM | POA: Diagnosis not present

## 2018-06-03 DIAGNOSIS — Z0000 Encounter for general adult medical examination without abnormal findings: Secondary | ICD-10-CM | POA: Diagnosis not present

## 2018-06-03 DIAGNOSIS — E1140 Type 2 diabetes mellitus with diabetic neuropathy, unspecified: Secondary | ICD-10-CM | POA: Diagnosis not present

## 2018-06-03 DIAGNOSIS — E785 Hyperlipidemia, unspecified: Secondary | ICD-10-CM | POA: Diagnosis not present

## 2018-06-18 ENCOUNTER — Ambulatory Visit: Payer: Medicare Other | Admitting: Podiatry

## 2018-06-18 DIAGNOSIS — C84A0 Cutaneous T-cell lymphoma, unspecified, unspecified site: Secondary | ICD-10-CM | POA: Diagnosis not present

## 2018-06-18 DIAGNOSIS — L821 Other seborrheic keratosis: Secondary | ICD-10-CM | POA: Diagnosis not present

## 2018-06-18 DIAGNOSIS — Z85820 Personal history of malignant melanoma of skin: Secondary | ICD-10-CM | POA: Diagnosis not present

## 2018-06-18 DIAGNOSIS — D225 Melanocytic nevi of trunk: Secondary | ICD-10-CM | POA: Diagnosis not present

## 2018-06-18 DIAGNOSIS — D2272 Melanocytic nevi of left lower limb, including hip: Secondary | ICD-10-CM | POA: Diagnosis not present

## 2018-06-20 ENCOUNTER — Ambulatory Visit: Payer: Medicare Other | Admitting: Podiatry

## 2018-07-09 ENCOUNTER — Encounter: Payer: Self-pay | Admitting: Podiatry

## 2018-07-09 ENCOUNTER — Ambulatory Visit (INDEPENDENT_AMBULATORY_CARE_PROVIDER_SITE_OTHER): Payer: Medicare Other | Admitting: Podiatry

## 2018-07-09 DIAGNOSIS — E1149 Type 2 diabetes mellitus with other diabetic neurological complication: Secondary | ICD-10-CM

## 2018-07-09 DIAGNOSIS — B351 Tinea unguium: Secondary | ICD-10-CM | POA: Diagnosis not present

## 2018-07-09 DIAGNOSIS — M79676 Pain in unspecified toe(s): Secondary | ICD-10-CM

## 2018-07-09 NOTE — Progress Notes (Signed)
Patient ID: Jay Bailey, male   DOB: 1941-09-02, 77 y.o.   MRN: [id number] Complaint:  Visit Type: Patient returns to my office for continued preventative foot care services. Complaint: Patient states" my nails have grown long and thick and become painful to walk and wear shoes" Patient has been diagnosed with DM with neuropathy.Marland Kitchen He presents for preventative foot care services. No changes to ROS  Podiatric Exam: Vascular: dorsalis pedis and posterior tibial pulses are palpable bilateral. Capillary return is immediate. Temperature gradient is WNL. Skin turgor WNL  Sensorium: Normal Semmes Weinstein monofilament test. Normal tactile sensation bilaterally. Nail Exam: Pt has thick disfigured discolored nails with subungual debris noted bilateral entire nail hallux through fifth toenails Ulcer Exam: There is no evidence of ulcer or pre-ulcerative changes or infection. Orthopedic Exam: Muscle tone and strength are WNL. No limitations in general ROM. No crepitus or effusions noted. Foot type and digits show no abnormalities. 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 Skin: No Porokeratosis. No infection or ulcers  Diagnosis:  Tinea unguium, Pain in right toe, pain in left toes  Treatment & Plan Procedures and Treatment: Consent by patient was obtained for treatment procedures. The patient understood the discussion of treatment and procedures well. All questions were answered thoroughly reviewed. Debridement of mycotic and hypertrophic toenails, 1 through 5 bilateral and clearing of subungual debris. No ulceration, no infection noted. ABN signed for 2019. Return Visit-Office Procedure: Patient instructed to return to the office for a follow up visit 10 weeks  for continued evaluation and treatment.   Gardiner Barefoot DPM

## 2018-07-23 DIAGNOSIS — H6123 Impacted cerumen, bilateral: Secondary | ICD-10-CM | POA: Diagnosis not present

## 2018-07-23 DIAGNOSIS — H903 Sensorineural hearing loss, bilateral: Secondary | ICD-10-CM | POA: Diagnosis not present

## 2018-09-16 DIAGNOSIS — K4090 Unilateral inguinal hernia, without obstruction or gangrene, not specified as recurrent: Secondary | ICD-10-CM | POA: Diagnosis not present

## 2018-09-22 DIAGNOSIS — K4091 Unilateral inguinal hernia, without obstruction or gangrene, recurrent: Secondary | ICD-10-CM | POA: Diagnosis not present

## 2018-09-24 ENCOUNTER — Ambulatory Visit: Payer: Medicare Other | Admitting: Podiatry

## 2018-09-25 DIAGNOSIS — R972 Elevated prostate specific antigen [PSA]: Secondary | ICD-10-CM | POA: Diagnosis not present

## 2018-09-25 DIAGNOSIS — H52203 Unspecified astigmatism, bilateral: Secondary | ICD-10-CM | POA: Diagnosis not present

## 2018-09-25 DIAGNOSIS — H2513 Age-related nuclear cataract, bilateral: Secondary | ICD-10-CM | POA: Diagnosis not present

## 2018-09-25 DIAGNOSIS — H5203 Hypermetropia, bilateral: Secondary | ICD-10-CM | POA: Diagnosis not present

## 2018-09-26 ENCOUNTER — Encounter: Payer: Self-pay | Admitting: Podiatry

## 2018-09-26 ENCOUNTER — Ambulatory Visit (INDEPENDENT_AMBULATORY_CARE_PROVIDER_SITE_OTHER): Payer: Medicare Other | Admitting: Podiatry

## 2018-09-26 DIAGNOSIS — B351 Tinea unguium: Secondary | ICD-10-CM | POA: Diagnosis not present

## 2018-09-26 DIAGNOSIS — E1149 Type 2 diabetes mellitus with other diabetic neurological complication: Secondary | ICD-10-CM

## 2018-09-26 DIAGNOSIS — M79676 Pain in unspecified toe(s): Secondary | ICD-10-CM

## 2018-09-26 NOTE — Progress Notes (Signed)
Patient ID: Jay Bailey, male   DOB: 07/04/1941, 77 y.o.   MRN: [id number] Complaint:  Visit Type: Patient returns to my office for continued preventative foot care services. Complaint: Patient states" my nails have grown long and thick and become painful to walk and wear shoes" Patient has been diagnosed with DM with neuropathy.. He presents for preventative foot care services. No changes to ROS  Podiatric Exam: Vascular: dorsalis pedis and posterior tibial pulses are palpable bilateral. Capillary return is immediate. Temperature gradient is WNL. Skin turgor WNL  Sensorium: Normal Semmes Weinstein monofilament test. Normal tactile sensation bilaterally. Nail Exam: Pt has thick disfigured discolored nails with subungual debris noted bilateral entire nail hallux through fifth toenails Ulcer Exam: There is no evidence of ulcer or pre-ulcerative changes or infection. Orthopedic Exam: Muscle tone and strength are WNL. No limitations in general ROM. No crepitus or effusions noted. Foot type and digits show no abnormalities. 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 Skin: No Porokeratosis. No infection or ulcers  Diagnosis:  Tinea unguium, Pain in right toe, pain in left toes  Treatment & Plan Procedures and Treatment: Consent by patient was obtained for treatment procedures. The patient understood the discussion of treatment and procedures well. All questions were answered thoroughly reviewed. Debridement of mycotic and hypertrophic toenails, 1 through 5 bilateral and clearing of subungual debris. No ulceration, no infection noted. ABN signed for 2019. Return Visit-Office Procedure: Patient instructed to return to the office for a follow up visit 10 weeks  for continued evaluation and treatment.   Jay Bailey DPM 

## 2018-10-03 DIAGNOSIS — K4090 Unilateral inguinal hernia, without obstruction or gangrene, not specified as recurrent: Secondary | ICD-10-CM | POA: Diagnosis not present

## 2018-10-03 DIAGNOSIS — C61 Malignant neoplasm of prostate: Secondary | ICD-10-CM | POA: Diagnosis not present

## 2018-10-03 DIAGNOSIS — N3946 Mixed incontinence: Secondary | ICD-10-CM | POA: Diagnosis not present

## 2018-10-03 DIAGNOSIS — Z9079 Acquired absence of other genital organ(s): Secondary | ICD-10-CM | POA: Diagnosis not present

## 2018-10-03 DIAGNOSIS — R972 Elevated prostate specific antigen [PSA]: Secondary | ICD-10-CM | POA: Diagnosis not present

## 2018-10-28 DIAGNOSIS — H903 Sensorineural hearing loss, bilateral: Secondary | ICD-10-CM | POA: Diagnosis not present

## 2018-10-28 DIAGNOSIS — H6123 Impacted cerumen, bilateral: Secondary | ICD-10-CM | POA: Diagnosis not present

## 2018-11-14 DIAGNOSIS — K4091 Unilateral inguinal hernia, without obstruction or gangrene, recurrent: Secondary | ICD-10-CM | POA: Diagnosis not present

## 2018-12-02 ENCOUNTER — Ambulatory Visit (INDEPENDENT_AMBULATORY_CARE_PROVIDER_SITE_OTHER): Payer: Medicare Other | Admitting: Podiatry

## 2018-12-02 ENCOUNTER — Ambulatory Visit: Payer: Medicare Other | Admitting: Podiatry

## 2018-12-02 ENCOUNTER — Encounter: Payer: Self-pay | Admitting: Podiatry

## 2018-12-02 DIAGNOSIS — M79676 Pain in unspecified toe(s): Secondary | ICD-10-CM

## 2018-12-02 DIAGNOSIS — E1149 Type 2 diabetes mellitus with other diabetic neurological complication: Secondary | ICD-10-CM

## 2018-12-02 DIAGNOSIS — B351 Tinea unguium: Secondary | ICD-10-CM | POA: Diagnosis not present

## 2018-12-02 NOTE — Progress Notes (Signed)
Patient ID: Jay Bailey, male   DOB: [birth year], 77 y.o.   MRN: [id number] Complaint:  Visit Type: Patient returns to my office for continued preventative foot care services. Complaint: Patient states" my nails have grown long and thick and become painful to walk and wear shoes" Patient has been diagnosed with DM with neuropathy.Marland Kitchen He presents for preventative foot care services. No changes to ROS  Podiatric Exam: Vascular: dorsalis pedis and posterior tibial pulses are palpable bilateral. Capillary return is immediate. Temperature gradient is WNL. Skin turgor WNL  Sensorium: Normal Semmes Weinstein monofilament test. Normal tactile sensation bilaterally. Nail Exam: Pt has thick disfigured discolored nails with subungual debris noted bilateral entire nail hallux through fifth toenails Ulcer Exam: There is no evidence of ulcer or pre-ulcerative changes or infection. Orthopedic Exam: Muscle tone and strength are WNL. No limitations in general ROM. No crepitus or effusions noted. Foot type and digits show no abnormalities. 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 Skin: No Porokeratosis. No infection or ulcers  Diagnosis:  Tinea unguium, Pain in right toe, pain in left toes  Treatment & Plan Procedures and Treatment: Consent by patient was obtained for treatment procedures. The patient understood the discussion of treatment and procedures well. All questions were answered thoroughly reviewed. Debridement of mycotic and hypertrophic toenails, 1 through 5 bilateral and clearing of subungual debris. No ulceration, no infection noted. ABN signed for 2019. Return Visit-Office Procedure: Patient instructed to return to the office for a follow up visit 10 weeks  for continued evaluation and treatment.   Gardiner Barefoot DPM

## 2018-12-05 ENCOUNTER — Ambulatory Visit: Payer: Medicare Other | Admitting: Podiatry

## 2018-12-09 DIAGNOSIS — C61 Malignant neoplasm of prostate: Secondary | ICD-10-CM | POA: Diagnosis not present

## 2018-12-09 DIAGNOSIS — K4090 Unilateral inguinal hernia, without obstruction or gangrene, not specified as recurrent: Secondary | ICD-10-CM | POA: Diagnosis not present

## 2018-12-09 DIAGNOSIS — E1140 Type 2 diabetes mellitus with diabetic neuropathy, unspecified: Secondary | ICD-10-CM | POA: Diagnosis not present

## 2018-12-09 DIAGNOSIS — I10 Essential (primary) hypertension: Secondary | ICD-10-CM | POA: Diagnosis not present

## 2018-12-09 DIAGNOSIS — F325 Major depressive disorder, single episode, in full remission: Secondary | ICD-10-CM | POA: Diagnosis not present

## 2018-12-09 DIAGNOSIS — E785 Hyperlipidemia, unspecified: Secondary | ICD-10-CM | POA: Diagnosis not present

## 2018-12-09 DIAGNOSIS — E1151 Type 2 diabetes mellitus with diabetic peripheral angiopathy without gangrene: Secondary | ICD-10-CM | POA: Diagnosis not present

## 2018-12-09 DIAGNOSIS — Z23 Encounter for immunization: Secondary | ICD-10-CM | POA: Diagnosis not present

## 2018-12-18 ENCOUNTER — Inpatient Hospital Stay (HOSPITAL_COMMUNITY): Admission: RE | Admit: 2018-12-18 | Payer: Medicare Other | Source: Ambulatory Visit

## 2018-12-18 DIAGNOSIS — Z23 Encounter for immunization: Secondary | ICD-10-CM | POA: Diagnosis not present

## 2018-12-18 DIAGNOSIS — D2272 Melanocytic nevi of left lower limb, including hip: Secondary | ICD-10-CM | POA: Diagnosis not present

## 2018-12-18 DIAGNOSIS — D225 Melanocytic nevi of trunk: Secondary | ICD-10-CM | POA: Diagnosis not present

## 2018-12-18 DIAGNOSIS — L821 Other seborrheic keratosis: Secondary | ICD-10-CM | POA: Diagnosis not present

## 2018-12-18 DIAGNOSIS — Z85820 Personal history of malignant melanoma of skin: Secondary | ICD-10-CM | POA: Diagnosis not present

## 2018-12-19 ENCOUNTER — Encounter (HOSPITAL_COMMUNITY): Payer: Self-pay

## 2018-12-19 ENCOUNTER — Other Ambulatory Visit: Payer: Self-pay

## 2018-12-19 ENCOUNTER — Encounter (HOSPITAL_COMMUNITY)
Admission: RE | Admit: 2018-12-19 | Discharge: 2018-12-19 | Disposition: A | Discharge status: Home / Self Care | Payer: Medicare Other | Source: Ambulatory Visit | Attending: Surgery | Admitting: Surgery

## 2018-12-19 DIAGNOSIS — Z01818 Encounter for other preprocedural examination: Secondary | ICD-10-CM | POA: Insufficient documentation

## 2018-12-19 DIAGNOSIS — R001 Bradycardia, unspecified: Secondary | ICD-10-CM | POA: Diagnosis not present

## 2018-12-19 DIAGNOSIS — K4090 Unilateral inguinal hernia, without obstruction or gangrene, not specified as recurrent: Secondary | ICD-10-CM | POA: Insufficient documentation

## 2018-12-19 DIAGNOSIS — E119 Type 2 diabetes mellitus without complications: Secondary | ICD-10-CM | POA: Insufficient documentation

## 2018-12-19 HISTORY — DX: Other congenital malformations of lower limb(s), including pelvic girdle: Q74.2

## 2018-12-19 HISTORY — DX: Depression, unspecified: F32.A

## 2018-12-19 HISTORY — DX: Presence of external hearing-aid: Z97.4

## 2018-12-19 HISTORY — DX: Major depressive disorder, single episode, unspecified: F32.9

## 2018-12-19 HISTORY — DX: Essential (primary) hypertension: I10

## 2018-12-19 HISTORY — DX: Unspecified osteoarthritis, unspecified site: M19.90

## 2018-12-19 HISTORY — DX: Unilateral inguinal hernia, without obstruction or gangrene, not specified as recurrent: K40.90

## 2018-12-19 HISTORY — DX: Malignant melanoma of skin, unspecified: C43.9

## 2018-12-19 HISTORY — DX: Complete traumatic metacarpophalangeal amputation of left index finger, initial encounter: S68.111A

## 2018-12-19 HISTORY — DX: Malignant neoplasm of prostate: C61

## 2018-12-19 LAB — HEMOGLOBIN A1C
Hgb A1c MFr Bld: 5.9 % — ABNORMAL HIGH (ref 4.8–5.6)
MEAN PLASMA GLUCOSE: 122.63 mg/dL

## 2018-12-19 LAB — BASIC METABOLIC PANEL
Anion gap: 9 (ref 5–15)
BUN: 20 mg/dL (ref 8–23)
CO2: 26 mmol/L (ref 22–32)
Calcium: 9.6 mg/dL (ref 8.9–10.3)
Chloride: 103 mmol/L (ref 98–111)
Creatinine, Ser: 0.96 mg/dL (ref 0.61–1.24)
GFR calc Af Amer: >60 mL/min (ref >60)
GFR calc non Af Amer: >60 mL/min (ref >60)
Glucose, Bld: 99 mg/dL (ref 70–99)
Potassium: 4.5 mmol/L (ref 3.5–5.1)
Sodium: 138 mmol/L (ref 135–145)

## 2018-12-19 LAB — CBC
HCT: 46.8 % (ref 39.0–52.0)
Hemoglobin: 15.4 g/dL (ref 13.0–17.0)
MCH: 32 pg (ref 26.0–34.0)
MCHC: 32.9 g/dL (ref 30.0–36.0)
MCV: 97.3 fL (ref 80.0–100.0)
Platelets: 167 10*3/uL (ref 150–400)
RBC: 4.81 MIL/uL (ref 4.22–5.81)
RDW: 12.8 % (ref 11.5–15.5)
WBC: 5.9 10*3/uL (ref 4.0–10.5)
nRBC: 0 % (ref 0.0–0.2)

## 2018-12-19 LAB — GLUCOSE, CAPILLARY: Glucose-Capillary: 100 mg/dL — ABNORMAL HIGH (ref 70–99)

## 2018-12-19 NOTE — Patient Instructions (Addendum)
MORGON PAMER  12/19/2018   Your procedure is scheduled on: 12-26-2018    Report to [facility] Main  Entrance     Report to admitting at 5:30AM    Call this number if you have problems the morning of surgery [phone]      Remember: NO SOLID FOOD AFTER MIDNIGHT THE NIGHT PRIOR TO SURGERY. NOTHING BY MOUTH EXCEPT CLEAR LIQUIDS UNTIL 3 HOURS PRIOR TO [place] SURGERY. PLEASE FINISH ENSURE DRINK PER SURGEON ORDER 3 HOURS PRIOR TO SCHEDULED SURGERY TIME WHICH NEEDS TO BE COMPLETED AT ______4:30AM______.   BRUSH YOUR TEETH MORNING OF SURGERY AND RINSE YOUR MOUTH OUT, NO CHEWING GUM CANDY OR MINTS.       CLEAR LIQUID DIET   Foods Allowed                                                                     Foods Excluded  Coffee and tea, regular and decaf                             liquids that you cannot  Plain Jell-O in any flavor                                             see through such as: Fruit ices (not with fruit pulp)                                     milk, soups, orange juice  Iced Popsicles                                    All solid food Carbonated beverages, regular and diet                                    Cranberry, grape and apple juices Sports drinks like Gatorade Lightly seasoned clear broth or consume(fat free) Sugar, honey syrup  Sample Menu Breakfast                                Lunch                                     Supper Cranberry juice                    Beef broth                            Chicken broth Jell-O  Grape juice                           Apple juice Coffee or tea                        Jell-O                                      Popsicle                                                Coffee or tea                        Coffee or tea  _____________________________________________________________________       Take these medicines the morning of surgery with A  SIP OF WATER: amlodipine, atorvastatin  PLEASE CHECK YOUR BLOOD SUGAR THE MORNING OF SURGERY. BE PREPARED TO REPORT TO YOUR NURSE ON ARRIVAL DO NOT TAKE ANY DIABETIC MEDICATIONS DAY OF YOUR SURGERY                               You may not have any metal on your body including hair pins and              piercings  Do not wear jewelry, make-up, lotions, powders or perfumes, deodorant              Men may shave face and neck.   Do not bring valuables to the hospital. [place].  Contacts, dentures or bridgework may not be worn into surgery.      Patients discharged the day of surgery will not be allowed to drive home. IF YOU ARE HAVING SURGERY AND GOING HOME THE SAME DAY, YOU MUST HAVE AN ADULT TO DRIVE YOU HOME AND BE WITH YOU FOR 24 HOURS. YOU MAY GO HOME BY TAXI OR UBER OR ORTHERWISE, BUT AN ADULT MUST ACCOMPANY YOU HOME AND STAY WITH YOU FOR 24 HOURS.  Name and phone number of your driver:  Special Instructions: N/A              Please read over the following fact sheets you were given: _____________________________________________________________________             [facility] - Preparing for Surgery Before surgery, you can play an important role.  Because skin is not sterile, your skin needs to be as free of germs as possible.  You can reduce the number of germs on your skin by washing with CHG (chlorahexidine gluconate) soap before surgery.  CHG is an antiseptic cleaner which kills germs and bonds with the skin to continue killing germs even after washing. Please DO NOT use if you have an allergy to CHG or antibacterial soaps.  If your skin becomes reddened/irritated stop using the CHG and inform your nurse when you arrive at Short Stay. Do not shave (including legs and underarms) for at least 48 hours prior to the first CHG shower.  You may shave your face/neck. Please follow  these instructions carefully:  1.  Shower with CHG Soap  the night before surgery and the  morning of Surgery.  2.  If you choose to wash your hair, wash your hair first as usual with your  normal  shampoo.  3.  After you shampoo, rinse your hair and body thoroughly to remove the  shampoo.                           4.  Use CHG as you would any other liquid soap.  You can apply chg directly  to the skin and wash                       Gently with a scrungie or clean washcloth.  5.  Apply the CHG Soap to your body ONLY FROM THE NECK DOWN.   Do not use on face/ open                           Wound or open sores. Avoid contact with eyes, ears mouth and genitals (private parts).                       Wash face,  Genitals (private parts) with your normal soap.             6.  Wash thoroughly, paying special attention to the area where your surgery  will be performed.  7.  Thoroughly rinse your body with warm water from the neck down.  8.  DO NOT shower/wash with your normal soap after using and rinsing off  the CHG Soap.                9.  Pat yourself dry with a clean towel.            10.  Wear clean pajamas.            11.  Place clean sheets on your bed the night of your first shower and do not  sleep with pets. Day of Surgery : Do not apply any lotions/deodorants the morning of surgery.  Please wear clean clothes to the hospital/surgery center.  FAILURE TO FOLLOW THESE INSTRUCTIONS MAY RESULT IN THE CANCELLATION OF YOUR SURGERY PATIENT SIGNATURE_________________________________  NURSE SIGNATURE__________________________________  ________________________________________________________________________

## 2018-12-22 ENCOUNTER — Encounter (HOSPITAL_COMMUNITY): Payer: Self-pay

## 2018-12-22 NOTE — Progress Notes (Signed)
Pre-op progress note   confirmed pre-op EKG abnormal. Chart placed in PA basket for review.   Info for PA:  Patient PCP Dr. Harlan Stains   Pre-op 12-19-2018. Elevated BP , patient asymptomatic, patient sister in law accompanying reports his BPs are normally lower ,patient agreed. Denies any other cardiac history

## 2018-12-24 DIAGNOSIS — J069 Acute upper respiratory infection, unspecified: Secondary | ICD-10-CM | POA: Diagnosis not present

## 2018-12-25 MED ORDER — BUPIVACAINE LIPOSOME 1.3 % IJ SUSP
20.0000 mL | Freq: Once | INTRAMUSCULAR | Status: DC
  Filled 2018-12-25: qty 20

## 2018-12-25 NOTE — Anesthesia Preprocedure Evaluation (Signed)
Anesthesia Evaluation  Patient identified by MRN, date of birth, ID band Patient awake    Reviewed: Allergy & Precautions, NPO status , Patient's Chart, lab work & pertinent test results  Airway Mallampati: II  TM Distance: >3 FB Neck ROM: Full    Dental  (+) Dental Advisory Given   Pulmonary neg pulmonary ROS,    Pulmonary exam normal breath sounds clear to auscultation       Cardiovascular hypertension, Pt. on medications Normal cardiovascular exam Rhythm:Regular Rate:Normal     Neuro/Psych negative neurological ROS  negative psych ROS   GI/Hepatic negative GI ROS, Neg liver ROS,   Endo/Other  diabetes, Type 2, Oral Hypoglycemic Agents  Renal/GU negative Renal ROS     Musculoskeletal  (+) Arthritis ,   Abdominal   Peds  Hematology negative hematology ROS (+)   Anesthesia Other Findings   Reproductive/Obstetrics                             Anesthesia Physical Anesthesia Plan  ASA: II  Anesthesia Plan: General   Post-op Pain Management: GA combined w/ Regional for post-op pain   Induction: Intravenous  PONV Risk Score and Plan: 3 and Ondansetron, Dexamethasone and Treatment may vary due to age or medical condition  Airway Management Planned: Oral ETT  Additional Equipment: None  Intra-op Plan:   Post-operative Plan: Extubation in OR  Informed Consent: I have reviewed the patients History and Physical, chart, labs and discussed the procedure including the risks, benefits and alternatives for the proposed anesthesia with the patient or authorized representative who has indicated his/her understanding and acceptance.     Dental advisory given  Plan Discussed with: CRNA  Anesthesia Plan Comments:         Anesthesia Quick Evaluation

## 2018-12-25 NOTE — H&P (Signed)
[name] Documented: 11/14/2018 2:02 PM Location: [facility] Patient #: [id number] DOB: 24-Dec-1940 Single / Language: Jay Bailey / Race: White Male   History of Present Illness Jay Bailey Done MD; 11/14/2018 2:34 PM) The patient is a 78 year old male who presents with an inguinal hernia. He is a 78 year old WM who has had multiple hernia repairs on the left and an open suprapubic prostatectomy. He has a broad based hernia defect on the left. It is bothering him and the truss didn't work. I would plan an open LIH with mesh under general. Will plan in January. He is aware that this is at high risk to recur since it has already recurred several times.    Allergies Emeline Gins, [place]; 11/14/2018 2:02 PM) No Known Drug Allergies [09/22/2018]: Allergies Reconciled   Medication History Emeline Gins, CMA; 11/14/2018 2:02 PM) amLODIPine Besylate (5MG  Tablet, Oral) Active. Ascorbic Acid (1000MG  Tablet, Oral) Active. Aspirin (81MG  Tablet, Oral) Active. Folic Acid (035DHR Tablet, Oral) Active. Glucosamine (750MG  Tablet, Oral) Active. Irbesartan (300MG  Tablet, Oral) Active. metFORMIN HCl (500MG  Tablet, Oral) Active. Multi-Vitamin (Oral) Active. Medications Reconciled  Vitals Emeline Gins CMA; 11/14/2018 2:02 PM) 11/14/2018 2:02 PM Weight: 187.13 lb Height: 67in Weight was reported by patient. Body Surface Area: 1.97 m Body Mass Index: 29.31 kg/m  Temp.: 97.51F  Pulse: 85 (Regular)  BP: 170/90 (Sitting, Left Arm, Standard)       Physical Exam (Layan Zalenski B. 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 Rosana Hoes, previous recurrent right inguinal hernias. Plan open left inguinal hernia repair  Matt B. Bailey Done, MD, FACS

## 2018-12-26 ENCOUNTER — Ambulatory Visit (HOSPITAL_COMMUNITY): Payer: Medicare Other | Admitting: Physician Assistant

## 2018-12-26 ENCOUNTER — Encounter (HOSPITAL_COMMUNITY): Payer: Self-pay | Admitting: *Deleted

## 2018-12-26 ENCOUNTER — Other Ambulatory Visit: Payer: Self-pay

## 2018-12-26 ENCOUNTER — Encounter (HOSPITAL_COMMUNITY)
Admission: RE | Disposition: A | Discharge status: Skilled Nursing Facility | Payer: Self-pay | Source: Home / Self Care | Attending: Surgery

## 2018-12-26 ENCOUNTER — Inpatient Hospital Stay (HOSPITAL_COMMUNITY)
Admission: RE | Admit: 2018-12-26 | Discharge: 2018-12-30 | DRG: 352 | Disposition: A | Discharge status: Skilled Nursing Facility | Payer: Medicare Other | Attending: Surgery | Admitting: Surgery

## 2018-12-26 ENCOUNTER — Ambulatory Visit (HOSPITAL_COMMUNITY): Payer: Medicare Other | Admitting: Anesthesiology

## 2018-12-26 DIAGNOSIS — Z9079 Acquired absence of other genital organ(s): Secondary | ICD-10-CM | POA: Diagnosis not present

## 2018-12-26 DIAGNOSIS — K4090 Unilateral inguinal hernia, without obstruction or gangrene, not specified as recurrent: Secondary | ICD-10-CM | POA: Diagnosis not present

## 2018-12-26 DIAGNOSIS — Z6828 Body mass index (BMI) 28.0-28.9, adult: Secondary | ICD-10-CM

## 2018-12-26 DIAGNOSIS — Z8546 Personal history of malignant neoplasm of prostate: Secondary | ICD-10-CM

## 2018-12-26 DIAGNOSIS — Z79899 Other long term (current) drug therapy: Secondary | ICD-10-CM | POA: Diagnosis not present

## 2018-12-26 DIAGNOSIS — Z7984 Long term (current) use of oral hypoglycemic drugs: Secondary | ICD-10-CM

## 2018-12-26 DIAGNOSIS — G809 Cerebral palsy, unspecified: Secondary | ICD-10-CM

## 2018-12-26 DIAGNOSIS — K4091 Unilateral inguinal hernia, without obstruction or gangrene, recurrent: Secondary | ICD-10-CM | POA: Diagnosis not present

## 2018-12-26 DIAGNOSIS — Z7982 Long term (current) use of aspirin: Secondary | ICD-10-CM

## 2018-12-26 DIAGNOSIS — R627 Adult failure to thrive: Secondary | ICD-10-CM | POA: Diagnosis present

## 2018-12-26 DIAGNOSIS — Z23 Encounter for immunization: Secondary | ICD-10-CM

## 2018-12-26 DIAGNOSIS — R54 Age-related physical debility: Secondary | ICD-10-CM | POA: Diagnosis present

## 2018-12-26 HISTORY — PX: INGUINAL HERNIA REPAIR: SHX194

## 2018-12-26 LAB — CREATININE, SERUM
Creatinine, Ser: 1.03 mg/dL (ref 0.61–1.24)
GFR calc Af Amer: >60 mL/min (ref >60)
GFR calc non Af Amer: >60 mL/min (ref >60)

## 2018-12-26 LAB — GLUCOSE, CAPILLARY
GLUCOSE-CAPILLARY: 117 mg/dL — AB (ref 70–99)
Glucose-Capillary: 92 mg/dL (ref 70–99)
Glucose-Capillary: 181 mg/dL — ABNORMAL HIGH (ref 70–99)

## 2018-12-26 LAB — CBC
HCT: 44.8 % (ref 39.0–52.0)
Hemoglobin: 14.7 g/dL (ref 13.0–17.0)
MCH: 32.3 pg (ref 26.0–34.0)
MCHC: 32.8 g/dL (ref 30.0–36.0)
MCV: 98.5 fL (ref 80.0–100.0)
Platelets: 165 10*3/uL (ref 150–400)
RBC: 4.55 MIL/uL (ref 4.22–5.81)
RDW: 12.7 % (ref 11.5–15.5)
WBC: 9.7 10*3/uL (ref 4.0–10.5)
nRBC: 0 % (ref 0.0–0.2)

## 2018-12-26 SURGERY — REPAIR, HERNIA, INGUINAL, ADULT
Anesthesia: General | Site: Inguinal | Laterality: Left

## 2018-12-26 MED ORDER — HEPARIN SODIUM (PORCINE) 5000 UNIT/ML IJ SOLN
5000.0000 [IU] | Freq: Three times a day (TID) | INTRAMUSCULAR | Status: DC
  Administered 2018-12-26 – 2018-12-30 (×11): 5000 [IU] via SUBCUTANEOUS
  Filled 2018-12-26 (×11): qty 1

## 2018-12-26 MED ORDER — PNEUMOCOCCAL VAC POLYVALENT 25 MCG/0.5ML IJ INJ
0.5000 mL | INJECTION | INTRAMUSCULAR | Status: AC
  Administered 2018-12-27: 0.5 mL via INTRAMUSCULAR
  Filled 2018-12-26: qty 0.5

## 2018-12-26 MED ORDER — CHLORHEXIDINE GLUCONATE CLOTH 2 % EX PADS: 6.0000 | MEDICATED_PAD | Freq: Once | CUTANEOUS | Status: DC

## 2018-12-26 MED ORDER — SUCCINYLCHOLINE CHLORIDE 200 MG/10ML IV SOSY
PREFILLED_SYRINGE | INTRAVENOUS | Status: AC
  Filled 2018-12-26: qty 10

## 2018-12-26 MED ORDER — ROCURONIUM BROMIDE 50 MG/5ML IV SOSY
PREFILLED_SYRINGE | INTRAVENOUS | Status: DC | PRN
  Administered 2018-12-26 (×2): 10 mg via INTRAVENOUS
  Administered 2018-12-26: 50 mg via INTRAVENOUS
  Administered 2018-12-26: 20 mg via INTRAVENOUS

## 2018-12-26 MED ORDER — 0.9 % SODIUM CHLORIDE (POUR BTL) OPTIME
TOPICAL | Status: DC | PRN
  Administered 2018-12-26: 1000 mL

## 2018-12-26 MED ORDER — DEXAMETHASONE SODIUM PHOSPHATE 10 MG/ML IJ SOLN
INTRAMUSCULAR | Status: AC
  Filled 2018-12-26: qty 1

## 2018-12-26 MED ORDER — ONDANSETRON 4 MG PO TBDP: 4.0000 mg | ORAL_TABLET | Freq: Four times a day (QID) | ORAL | Status: DC | PRN

## 2018-12-26 MED ORDER — EPHEDRINE SULFATE-NACL 50-0.9 MG/10ML-% IV SOSY
PREFILLED_SYRINGE | INTRAVENOUS | Status: DC | PRN
  Administered 2018-12-26: 10 mg via INTRAVENOUS
  Administered 2018-12-26: 5 mg via INTRAVENOUS

## 2018-12-26 MED ORDER — HYDROMORPHONE HCL 1 MG/ML IJ SOLN: 0.2500 mg | INTRAMUSCULAR | Status: DC | PRN

## 2018-12-26 MED ORDER — MEPERIDINE HCL 50 MG/ML IJ SOLN: 6.2500 mg | INTRAMUSCULAR | Status: DC | PRN

## 2018-12-26 MED ORDER — ONDANSETRON HCL 4 MG/2ML IJ SOLN: 4.0000 mg | Freq: Four times a day (QID) | INTRAMUSCULAR | Status: DC | PRN

## 2018-12-26 MED ORDER — CEFAZOLIN SODIUM-DEXTROSE 2-4 GM/100ML-% IV SOLN
2.0000 g | Freq: Three times a day (TID) | INTRAVENOUS | Status: AC
  Administered 2018-12-26: 2 g via INTRAVENOUS
  Filled 2018-12-26: qty 100

## 2018-12-26 MED ORDER — ONDANSETRON HCL 4 MG/2ML IJ SOLN
INTRAMUSCULAR | Status: AC
  Filled 2018-12-26: qty 2

## 2018-12-26 MED ORDER — SODIUM CHLORIDE 0.9% FLUSH
INTRAVENOUS | Status: DC | PRN
  Administered 2018-12-26: 10 mL

## 2018-12-26 MED ORDER — BUPIVACAINE LIPOSOME 1.3 % IJ SUSP
INTRAMUSCULAR | Status: DC | PRN
  Administered 2018-12-26: 20 mL

## 2018-12-26 MED ORDER — PHENYLEPHRINE 40 MCG/ML (10ML) SYRINGE FOR IV PUSH (FOR BLOOD PRESSURE SUPPORT)
PREFILLED_SYRINGE | INTRAVENOUS | Status: AC
  Filled 2018-12-26: qty 10

## 2018-12-26 MED ORDER — ACETAMINOPHEN 500 MG PO TABS
1000.0000 mg | ORAL_TABLET | ORAL | Status: AC
  Administered 2018-12-26: 1000 mg via ORAL
  Filled 2018-12-26: qty 2

## 2018-12-26 MED ORDER — PHENYLEPHRINE 40 MCG/ML (10ML) SYRINGE FOR IV PUSH (FOR BLOOD PRESSURE SUPPORT)
PREFILLED_SYRINGE | INTRAVENOUS | Status: DC | PRN
  Administered 2018-12-26 (×2): 80 ug via INTRAVENOUS

## 2018-12-26 MED ORDER — OXYBUTYNIN CHLORIDE ER 5 MG PO TB24
5.0000 mg | ORAL_TABLET | Freq: Every day | ORAL | Status: DC
  Administered 2018-12-26 – 2018-12-30 (×5): 5 mg via ORAL
  Filled 2018-12-26 (×5): qty 1

## 2018-12-26 MED ORDER — LIDOCAINE 2% (20 MG/ML) 5 ML SYRINGE
INTRAMUSCULAR | Status: DC | PRN
  Administered 2018-12-26: 80 mg via INTRAVENOUS

## 2018-12-26 MED ORDER — SUGAMMADEX SODIUM 200 MG/2ML IV SOLN
INTRAVENOUS | Status: AC
  Filled 2018-12-26: qty 2

## 2018-12-26 MED ORDER — SODIUM CHLORIDE (PF) 0.9 % IJ SOLN
INTRAMUSCULAR | Status: AC
  Filled 2018-12-26: qty 10

## 2018-12-26 MED ORDER — HYDROCODONE-ACETAMINOPHEN 5-325 MG PO TABS
1.0000 | ORAL_TABLET | ORAL | Status: DC | PRN
  Administered 2018-12-26 – 2018-12-29 (×4): 1 via ORAL
  Filled 2018-12-26 (×4): qty 1

## 2018-12-26 MED ORDER — ONDANSETRON HCL 4 MG/2ML IJ SOLN
INTRAMUSCULAR | Status: DC | PRN
  Administered 2018-12-26: 4 mg via INTRAVENOUS

## 2018-12-26 MED ORDER — LIDOCAINE 2% (20 MG/ML) 5 ML SYRINGE
INTRAMUSCULAR | Status: AC
  Filled 2018-12-26: qty 5

## 2018-12-26 MED ORDER — PROMETHAZINE HCL 25 MG/ML IJ SOLN: 6.2500 mg | INTRAMUSCULAR | Status: DC | PRN

## 2018-12-26 MED ORDER — FENTANYL CITRATE (PF) 100 MCG/2ML IJ SOLN
INTRAMUSCULAR | Status: DC | PRN
  Administered 2018-12-26: 100 ug via INTRAVENOUS
  Administered 2018-12-26 (×2): 50 ug via INTRAVENOUS

## 2018-12-26 MED ORDER — EPHEDRINE 5 MG/ML INJ
INTRAVENOUS | Status: AC
  Filled 2018-12-26: qty 10

## 2018-12-26 MED ORDER — CEFAZOLIN SODIUM-DEXTROSE 2-4 GM/100ML-% IV SOLN
2.0000 g | INTRAVENOUS | Status: AC
  Administered 2018-12-26: 2 g via INTRAVENOUS
  Filled 2018-12-26: qty 100

## 2018-12-26 MED ORDER — SUGAMMADEX SODIUM 200 MG/2ML IV SOLN
INTRAVENOUS | Status: DC | PRN
  Administered 2018-12-26: 200 mg via INTRAVENOUS

## 2018-12-26 MED ORDER — FENTANYL CITRATE (PF) 250 MCG/5ML IJ SOLN
INTRAMUSCULAR | Status: AC
  Filled 2018-12-26: qty 5

## 2018-12-26 MED ORDER — LIDOCAINE HCL 2 % IJ SOLN
INTRAMUSCULAR | Status: AC
  Filled 2018-12-26: qty 20

## 2018-12-26 MED ORDER — AMLODIPINE BESYLATE 2.5 MG PO TABS
2.5000 mg | ORAL_TABLET | Freq: Every day | ORAL | Status: DC
  Administered 2018-12-27 – 2018-12-30 (×4): 2.5 mg via ORAL
  Filled 2018-12-26 (×4): qty 1

## 2018-12-26 MED ORDER — PROPOFOL 10 MG/ML IV BOLUS
INTRAVENOUS | Status: AC
  Filled 2018-12-26: qty 20

## 2018-12-26 MED ORDER — ROCURONIUM BROMIDE 100 MG/10ML IV SOLN
INTRAVENOUS | Status: AC
  Filled 2018-12-26: qty 1

## 2018-12-26 MED ORDER — GABAPENTIN 300 MG PO CAPS
300.0000 mg | ORAL_CAPSULE | Freq: Two times a day (BID) | ORAL | Status: DC
  Administered 2018-12-26 – 2018-12-30 (×8): 300 mg via ORAL
  Filled 2018-12-26 (×8): qty 1

## 2018-12-26 MED ORDER — KCL IN DEXTROSE-NACL 20-5-0.45 MEQ/L-%-% IV SOLN
INTRAVENOUS | Status: DC
  Administered 2018-12-26 – 2018-12-27 (×3): via INTRAVENOUS
  Filled 2018-12-26 (×2): qty 1000

## 2018-12-26 MED ORDER — DEXAMETHASONE SODIUM PHOSPHATE 10 MG/ML IJ SOLN
INTRAMUSCULAR | Status: DC | PRN
  Administered 2018-12-26: 5 mg via INTRAVENOUS

## 2018-12-26 MED ORDER — IRBESARTAN 300 MG PO TABS
300.0000 mg | ORAL_TABLET | Freq: Every day | ORAL | Status: DC
  Administered 2018-12-26 – 2018-12-30 (×5): 300 mg via ORAL
  Filled 2018-12-26 (×5): qty 1

## 2018-12-26 MED ORDER — PHENYLEPHRINE HCL 10 MG/ML IJ SOLN
INTRAMUSCULAR | Status: AC
  Filled 2018-12-26: qty 1

## 2018-12-26 MED ORDER — HYDRALAZINE HCL 20 MG/ML IJ SOLN: 10.0000 mg | INTRAMUSCULAR | Status: DC | PRN

## 2018-12-26 MED ORDER — HEPARIN SODIUM (PORCINE) 5000 UNIT/ML IJ SOLN
5000.0000 [IU] | Freq: Once | INTRAMUSCULAR | Status: AC
  Administered 2018-12-26: 5000 [IU] via SUBCUTANEOUS
  Filled 2018-12-26: qty 1

## 2018-12-26 MED ORDER — LACTATED RINGERS IV SOLN
INTRAVENOUS | Status: DC
  Administered 2018-12-26: 07:00:00 via INTRAVENOUS

## 2018-12-26 MED ORDER — SUGAMMADEX SODIUM 500 MG/5ML IV SOLN
INTRAVENOUS | Status: AC
  Filled 2018-12-26: qty 5

## 2018-12-26 MED ORDER — PROPOFOL 10 MG/ML IV BOLUS
INTRAVENOUS | Status: DC | PRN
  Administered 2018-12-26: 150 mg via INTRAVENOUS

## 2018-12-26 MED ORDER — PHENYLEPHRINE HCL 10 MG/ML IJ SOLN
INTRAVENOUS | Status: DC | PRN
  Administered 2018-12-26: 25 ug/min via INTRAVENOUS

## 2018-12-26 SURGICAL SUPPLY — 32 items
BLADE SURG 15 STRL LF DISP TIS (BLADE) ×1 IMPLANT
BLADE SURG 15 STRL SS (BLADE) ×2
COVER SURGICAL LIGHT HANDLE (MISCELLANEOUS) ×3 IMPLANT
COVER WAND RF STERILE (DRAPES) IMPLANT
DECANTER SPIKE VIAL GLASS SM (MISCELLANEOUS) ×3 IMPLANT
DERMABOND ADVANCED (GAUZE/BANDAGES/DRESSINGS) ×2
DERMABOND ADVANCED .7 DNX12 (GAUZE/BANDAGES/DRESSINGS) ×1 IMPLANT
DISSECTOR ROUND CHERRY 3/8 STR (MISCELLANEOUS) IMPLANT
DRAIN PENROSE 18X1/2 LTX STRL (DRAIN) ×3 IMPLANT
DRAPE LAPAROTOMY TRNSV 102X78 (DRAPE) ×3 IMPLANT
ELECT PENCIL ROCKER SW 15FT (MISCELLANEOUS) ×3 IMPLANT
ELECT REM PT RETURN 15FT ADLT (MISCELLANEOUS) ×3 IMPLANT
GLOVE BIOGEL M 8.0 STRL (GLOVE) ×3 IMPLANT
GOWN STRL REUS W/TWL XL LVL3 (GOWN DISPOSABLE) ×6 IMPLANT
KIT BASIN OR (CUSTOM PROCEDURE TRAY) ×3 IMPLANT
MESH HERNIA 3X6 (Mesh General) ×3 IMPLANT
NEEDLE HYPO 22GX1.5 SAFETY (NEEDLE) ×3 IMPLANT
PACK BASIC VI WITH GOWN DISP (CUSTOM PROCEDURE TRAY) ×3 IMPLANT
SPONGE LAP 4X18 RFD (DISPOSABLE) ×9 IMPLANT
STAPLER VISISTAT 35W (STAPLE) IMPLANT
SUT NOVA NAB DX-16 0-1 5-0 T12 (SUTURE) ×3 IMPLANT
SUT PROLENE 2 0 CT2 30 (SUTURE) ×6 IMPLANT
SUT SILK 2 0 SH (SUTURE) IMPLANT
SUT VIC AB 2-0 SH 27 (SUTURE) ×2
SUT VIC AB 2-0 SH 27X BRD (SUTURE) ×1 IMPLANT
SUT VIC AB 4-0 SH 18 (SUTURE) ×6 IMPLANT
SUT VICRYL 4-0 (SUTURE) ×3 IMPLANT
SYR 20CC LL (SYRINGE) ×3 IMPLANT
SYR BULB IRRIGATION 50ML (SYRINGE) ×3 IMPLANT
TOWEL OR 17X26 10 PK STRL BLUE (TOWEL DISPOSABLE) ×3 IMPLANT
TOWEL OR NON WOVEN STRL DISP B (DISPOSABLE) ×3 IMPLANT
YANKAUER SUCT BULB TIP 10FT TU (MISCELLANEOUS) ×3 IMPLANT

## 2018-12-26 NOTE — Transfer of Care (Signed)
Immediate Anesthesia Transfer of Care Note  Patient: Jay Bailey  Procedure(s) Performed: OPEN LEFT INGUINAL HERNIA WITH MESH (Left Inguinal)  Patient Location: PACU  Anesthesia Type:General  Level of Consciousness: awake, alert  and oriented  Airway & Oxygen Therapy: Patient Spontanous Breathing and Patient connected to face mask oxygen  Post-op Assessment: Report given to RN and Post -op Vital signs reviewed and stable  Post vital signs: Reviewed and stable  Last Vitals:  Vitals Value Taken Time  BP 140/66 12/26/2018 10:09 AM  Temp    Pulse 66 12/26/2018 10:12 AM  Resp 16 12/26/2018 10:12 AM  SpO2 99 % 12/26/2018 10:12 AM  Vitals shown include unvalidated device data.  Last Pain:  Vitals:   12/26/18 0631  TempSrc:   PainSc: 8       Patients Stated Pain Goal: 4 (16/60/60 0459)  Complications: No apparent anesthesia complications

## 2018-12-26 NOTE — Interval H&P Note (Signed)
History and Physical Interval Note:  12/26/2018 7:15 AM  [name]  has presented today for surgery, with the diagnosis of LEFT INGUINAL HERNIA  The various methods of treatment have been discussed with the patient and family. After consideration of risks, benefits and other options for treatment, the patient has consented to  Procedure(s): OPEN LEFT INGUINAL HERNIA (N/A) as a surgical intervention .  The patient's history has been reviewed,-he had a primary repair by Dr. Leafy Kindle in 1987, a recurrent repair in 2005 by Dr. Bubba Camp with Ethicon hernia mesh system, a third intervention by Dr. Bubba Camp with mesh plug placement for a femoral hernia, and a fourth repair at [place] ~ 62 years ago-- patient examined, no change in status, stable for surgery.  I have reviewed the patient's chart and labs.  Questions were answered to the patient's satisfaction.  I explained that repair might jeopardize the blood supply to the testicle and there is a significant recurrence rate.  He has been in a lot of pain and wants to proceed.     Jay Bailey

## 2018-12-26 NOTE — Anesthesia Postprocedure Evaluation (Signed)
Anesthesia Post Note  Patient: Jay Bailey  Procedure(s) Performed: OPEN LEFT INGUINAL HERNIA WITH MESH (Left Inguinal)     Patient location during evaluation: PACU Anesthesia Type: General Level of consciousness: sedated and patient cooperative Pain management: pain level controlled Vital Signs Assessment: post-procedure vital signs reviewed and stable Respiratory status: spontaneous breathing Cardiovascular status: stable Anesthetic complications: no    Last Vitals:  Vitals:   12/26/18 1217 12/26/18 1307  BP: (!) 150/75 130/90  Pulse: 81 82  Resp: 20 15  Temp: 36.6 C 36.4 C  SpO2: 100% 99%    Last Pain:  Vitals:   12/26/18 1307  TempSrc: Oral  PainSc:                  Nolon Nations

## 2018-12-26 NOTE — Anesthesia Procedure Notes (Signed)
Procedure Name: Intubation Date/Time: 12/26/2018 7:42 AM Performed by: Maxwell Caul, CRNA Pre-anesthesia Checklist: Patient identified, Emergency Drugs available, Suction available and Patient being monitored Patient Re-evaluated:Patient Re-evaluated prior to induction Oxygen Delivery Method: Circle system utilized Preoxygenation: Pre-oxygenation with 100% oxygen Induction Type: IV induction Ventilation: Mask ventilation without difficulty Laryngoscope Size: Mac and 4 Grade View: Grade II Tube type: Oral Tube size: 7.5 mm Number of attempts: 1 Airway Equipment and Method: Stylet Placement Confirmation: ETT inserted through vocal cords under direct vision,  positive ETCO2 and breath sounds checked- equal and bilateral Secured at: 21 cm Tube secured with: Tape Dental Injury: Teeth and Oropharynx as per pre-operative assessment

## 2018-12-26 NOTE — Op Note (Signed)
BROC CASPERS  Mar 25, 1941 26 December 2018    PCP:  Harlan Stains, MD   Surgeon: Kaylyn Lim, MD, FACS  Asst:  none  Anes:  general  Preop Dx: Four prior left inguinal hernia repairs and a prior open prostatectomy Postop Dx: same  Procedure: Repair of medial recurrent of left inguinal hernia with mesh Location Surgery: [facility] 1 Complications: None noted  EBL:   minimal cc  Drains: none  Description of Procedure:  The patient was taken to OR 1 .  After anesthesia was administered and the patient was prepped  with [employer] and a timeout was performed.  The patient has had prior surgery through transverse incisions in the left inguinal region.  This extended over to a midline incision where it had his previous prostatectomy.  His prior surgeries included a McV[place] repair in 1987, an open inguinal hernia repair around 2004 using the Ethicon hernia mesh system which employs 2 levels of mesh on the inside and the outside, a recurrent hernia in 2006 which was treated as a femoral hernia with a mesh plug and then about 10 years ago he had a repair at [facility] for which I do not have an operating note.  I excised his old scar and went down through scar tissue until I would move the laterally to try to get into some normal tissue and identified the external oblique.  It appeared that he had extended exteriorized his cord at some point in the cord structures I managed to get around with my finger and then placed a Penrose drain about him.  The defect appeared medial going over to the mid toward the midline.  I got down on that and dissected it free and found this weakened area that was approximately 1 inch in diameter and the lateral and inferior margins I could feel the edge of the mesh plug patch system which was firmly anchored inside but had contracted.  I grasped this with a Coker clamp and dissected it free to where I could identify the mesh.  Medially I could feel the edge of the fascia.  I used a  piece of Marlex type mesh I cut a circular piece about an inch in diameter and sutured it to the mesh with horizontal mattress sutures of #1 Novafil.  I then sutured it again with horizontal mattress sutures medially to the midline fascia and this seemed to obliterate the defect and the mesh seemed to take the tension off of this repair.  I irrigated.  I injected it with 30 cc of Exparel.  I closed the tissue over the mesh and then closed the incision in layers ultimately with 4-0 Vicryl subcuticularly and then with Dermabond.  The patient tolerated the procedure well and was taken to the PACU in stable condition.     Matt B. Hassell Done, [place], [facility], [place]

## 2018-12-26 NOTE — Evaluation (Signed)
Physical Therapy Evaluation Patient Details Name: Jay Bailey MRN: [id number] DOB: 1941-11-23 Today's Date: 12/26/2018   History of Present Illness  Pt is a 78 year old male s/p Repair of medial recurrent of left inguinal hernia with mesh  Clinical Impression  Pt admitted with above diagnosis. Pt currently with functional limitations due to the deficits listed below (see PT Problem List).  Pt will benefit from skilled PT to increase their independence and safety with mobility to allow discharge to the venue listed below.   Pt reports he has shoe aide and reacher at home to assist himself with dressing and also uses SPC at baseline. Pt has RW and agreeable to use upon d/c during surgical recovery.  Pt min/guard to supervision level for mobility at this time and feel pt would do well with HHPT upon d/c.  Pt and RN reports pt's sister-in-law would like pt to d/c to SNF prior to home however pt has equipment and can perform household mobility.  Recommend HHPT to f/u with pt upon d/c.     Follow Up Recommendations Home health PT    Equipment Recommendations  None recommended by PT    Recommendations for Other Services       Precautions / Restrictions Precautions Precautions: Other (comment) Precaution Comments: left inguinal incision      Mobility  Bed Mobility               General bed mobility comments: pt up in recliner on arrival  Transfers Overall transfer level: Needs assistance Equipment used: Rolling walker (2 wheeled) Transfers: Sit to/from Stand Sit to Stand: Min guard;Supervision         General transfer comment: utilizes UE for self assist, min/guard for safety   Ambulation/Gait Ambulation/Gait assistance: Min Gaffer (Feet): 250 Feet Assistive device: Rolling walker (2 wheeled) Gait Pattern/deviations: Step-through pattern     General Gait Details: pt with decreased hip flexion and increased pelvic rotation bilaterally, also  with increased bil hip internal rotation at rest and during ambulation (hx of bil THAs, and recurrent inguinal hernia repairs) which is likely close to pt's baseline, no reports of increased pain, no unsteadiness or LOB  Stairs            Wheelchair Mobility    Modified Rankin (Stroke Patients Only)       Balance Overall balance assessment: No apparent balance deficits (not formally assessed)(uses SPC at baseline, agreeable to use RW while healing, denies any falls)                                           Pertinent Vitals/Pain Pain Assessment: No/denies pain    Home Living Family/patient expects to be discharged to:: Private residence Living Arrangements: Alone Available Help at Discharge: Family;Available PRN/intermittently Type of Home: House Home Access: Level entry     Home Layout: One level Home Equipment: Walker - 2 wheels;Cane - single point;Adaptive equipment      Prior Function Level of Independence: Independent with assistive device(s)         Comments: uses SPC at baseline     Hand Dominance        Extremity/Trunk Assessment        Lower Extremity Assessment Lower Extremity Assessment: Overall WFL for tasks assessed(appears to have decreased hip/pelvis motion likely from multiple surgical hx)       Communication  Communication: HOH(bil hearing aids)  Cognition Arousal/Alertness: Awake/alert Behavior During Therapy: WFL for tasks assessed/performed Overall Cognitive Status: Within Functional Limits for tasks assessed                                        General Comments      Exercises     Assessment/Plan    PT Assessment Patient needs continued PT services  PT Problem List Decreased mobility;Decreased activity tolerance;Decreased knowledge of use of DME       PT Treatment Interventions Gait training;DME instruction;Functional mobility training;Therapeutic activities;Therapeutic  exercise;Neuromuscular re-education;Balance training;Patient/family education    PT Goals (Current goals can be found in the Care Plan section)  Acute Rehab PT Goals PT Goal Formulation: With patient Time For Goal Achievement: 01/02/19 Potential to Achieve Goals: Good    Frequency Min 3X/week   Barriers to discharge        Co-evaluation               AM-PAC PT "6 Clicks" Mobility  Outcome Measure Help needed turning from your back to your side while in a flat bed without using bedrails?: None Help needed moving from lying on your back to sitting on the side of a flat bed without using bedrails?: A Little Help needed moving to and from a bed to a chair (including a wheelchair)?: A Little Help needed standing up from a chair using your arms (e.g., wheelchair or bedside chair)?: A Little Help needed to walk in hospital room?: A Little Help needed climbing 3-5 steps with a railing? : A Little 6 Click Score: 19    End of Session Equipment Utilized During Treatment: Gait belt Activity Tolerance: Patient tolerated treatment well Patient left: in chair;with call bell/phone within reach   PT Visit Diagnosis: Difficulty in walking, not elsewhere classified (R26.2)    Time: 4196-2229 PT Time Calculation (min) (ACUTE ONLY): 18 min   Charges:   PT Evaluation $PT Eval Low Complexity: [place], PT, DPT Acute Rehabilitation Services Office: [phone] Pager: [phone]  Trena Platt 12/26/2018, 3:15 PM

## 2018-12-27 DIAGNOSIS — R54 Age-related physical debility: Secondary | ICD-10-CM | POA: Diagnosis present

## 2018-12-27 DIAGNOSIS — K4091 Unilateral inguinal hernia, without obstruction or gangrene, recurrent: Secondary | ICD-10-CM | POA: Diagnosis present

## 2018-12-27 DIAGNOSIS — R2681 Unsteadiness on feet: Secondary | ICD-10-CM | POA: Diagnosis not present

## 2018-12-27 DIAGNOSIS — R627 Adult failure to thrive: Secondary | ICD-10-CM | POA: Diagnosis present

## 2018-12-27 DIAGNOSIS — Z79899 Other long term (current) drug therapy: Secondary | ICD-10-CM | POA: Diagnosis not present

## 2018-12-27 DIAGNOSIS — R41841 Cognitive communication deficit: Secondary | ICD-10-CM | POA: Diagnosis not present

## 2018-12-27 DIAGNOSIS — Z7984 Long term (current) use of oral hypoglycemic drugs: Secondary | ICD-10-CM | POA: Diagnosis not present

## 2018-12-27 DIAGNOSIS — R29898 Other symptoms and signs involving the musculoskeletal system: Secondary | ICD-10-CM | POA: Diagnosis not present

## 2018-12-27 DIAGNOSIS — Z7982 Long term (current) use of aspirin: Secondary | ICD-10-CM | POA: Diagnosis not present

## 2018-12-27 DIAGNOSIS — R5381 Other malaise: Secondary | ICD-10-CM | POA: Diagnosis not present

## 2018-12-27 DIAGNOSIS — Z8546 Personal history of malignant neoplasm of prostate: Secondary | ICD-10-CM | POA: Diagnosis not present

## 2018-12-27 DIAGNOSIS — Z9079 Acquired absence of other genital organ(s): Secondary | ICD-10-CM | POA: Diagnosis not present

## 2018-12-27 DIAGNOSIS — M2550 Pain in unspecified joint: Secondary | ICD-10-CM | POA: Diagnosis not present

## 2018-12-27 DIAGNOSIS — R278 Other lack of coordination: Secondary | ICD-10-CM | POA: Diagnosis not present

## 2018-12-27 DIAGNOSIS — R2689 Other abnormalities of gait and mobility: Secondary | ICD-10-CM | POA: Diagnosis not present

## 2018-12-27 DIAGNOSIS — Z6828 Body mass index (BMI) 28.0-28.9, adult: Secondary | ICD-10-CM | POA: Diagnosis not present

## 2018-12-27 DIAGNOSIS — G809 Cerebral palsy, unspecified: Secondary | ICD-10-CM | POA: Diagnosis present

## 2018-12-27 DIAGNOSIS — M6281 Muscle weakness (generalized): Secondary | ICD-10-CM | POA: Diagnosis not present

## 2018-12-27 DIAGNOSIS — Z48816 Encounter for surgical aftercare following surgery on the genitourinary system: Secondary | ICD-10-CM | POA: Diagnosis not present

## 2018-12-27 DIAGNOSIS — Z23 Encounter for immunization: Secondary | ICD-10-CM | POA: Diagnosis not present

## 2018-12-27 DIAGNOSIS — Z7401 Bed confinement status: Secondary | ICD-10-CM | POA: Diagnosis not present

## 2018-12-27 LAB — CBC
HCT: 38.7 % — ABNORMAL LOW (ref 39.0–52.0)
Hemoglobin: 12.5 g/dL — ABNORMAL LOW (ref 13.0–17.0)
MCH: 31.7 pg (ref 26.0–34.0)
MCHC: 32.3 g/dL (ref 30.0–36.0)
MCV: 98.2 fL (ref 80.0–100.0)
Platelets: 179 10*3/uL (ref 150–400)
RBC: 3.94 MIL/uL — ABNORMAL LOW (ref 4.22–5.81)
RDW: 13.1 % (ref 11.5–15.5)
WBC: 10.8 10*3/uL — ABNORMAL HIGH (ref 4.0–10.5)
nRBC: 0 % (ref 0.0–0.2)

## 2018-12-27 MED ORDER — GUAIFENESIN-DM 100-10 MG/5ML PO SYRP
5.0000 mL | ORAL_SOLUTION | ORAL | Status: DC | PRN
  Administered 2018-12-27 – 2018-12-29 (×5): 5 mL via ORAL
  Filled 2018-12-27 (×5): qty 10

## 2018-12-27 NOTE — NC FL2 (Signed)
[place] LEVEL OF CARE SCREENING TOOL     IDENTIFICATION  Patient Name: Jay Bailey Birthdate: September 15, 1941 Sex: male Admission Date (Current Location): 12/26/2018  [facility] and [place] Number:  Herbalist and Address:  [facility],  [place], [place]      Provider Number: [id number]  Attending Physician Name and Address:  Johnathan Hausen, MD  Relative Name and Phone Number:  Samwise Eckardt: [phone]    Current Level of Care: Hospital Recommended Level of Care: [place] Prior Approval Number:    Date Approved/Denied:   PASRR Number: [id number]  Discharge Plan: SNF    Current Diagnoses: Patient Active Problem List   Diagnosis Date Noted  . Adult failure to thrive 12/27/2018  . Recurrent left inguinal hernia 12/26/2018    Orientation RESPIRATION BLADDER Height & Weight     Self, Situation, Place, Time  Normal Continent Weight: 185 lb (83.9 kg) Height:  5\' 8"  (172.7 cm)  BEHAVIORAL SYMPTOMS/MOOD NEUROLOGICAL BOWEL NUTRITION STATUS      Continent Diet(Regular)  AMBULATORY STATUS COMMUNICATION OF NEEDS Skin   Limited Assist Verbally Surgical wounds(L abdomen incision)                       Personal Care Assistance Level of Assistance  Bathing, Feeding, Dressing Bathing Assistance: Limited assistance Feeding assistance: Limited assistance Dressing Assistance: Limited assistance     Functional Limitations Info  Sight, Hearing, Speech Sight Info: Adequate Hearing Info: Adequate Speech Info: Adequate    SPECIAL CARE FACTORS FREQUENCY  PT (By licensed PT), OT (By licensed OT)     PT Frequency: 5x/week OT Frequency: 5x/week            Contractures Contractures Info: Not present    Additional Factors Info  Code Status, Allergies Code Status Info: Full Allergies Info: NKA           Current Medications (12/27/2018):  This is the current hospital active medication  list Current Facility-Administered Medications  Medication Dose Route Frequency Provider Last Rate Last Dose  . amLODipine (NORVASC) tablet 2.5 mg  2.5 mg Oral Daily Johnathan Hausen, MD   2.5 mg at 12/27/18 0943  . gabapentin (NEURONTIN) capsule 300 mg  300 mg Oral BID Johnathan Hausen, MD   300 mg at 12/27/18 7341  . guaiFENesin-dextromethorphan (ROBITUSSIN DM) 100-10 MG/5ML syrup 5 mL  5 mL Oral Q4H PRN Johnathan Hausen, MD   5 mL at 12/27/18 0243  . heparin injection 5,000 Units  5,000 Units Subcutaneous Q8H Johnathan Hausen, MD   5,000 Units at 12/27/18 0602  . hydrALAZINE (APRESOLINE) injection 10 mg  10 mg Intravenous Q2H PRN Johnathan Hausen, MD      . HYDROcodone-acetaminophen (NORCO/VICODIN) 5-325 MG per tablet 1-2 tablet  1-2 tablet Oral Q4H PRN Johnathan Hausen, MD   1 tablet at 12/26/18 2218  . irbesartan (AVAPRO) tablet 300 mg  300 mg Oral Daily Johnathan Hausen, MD   300 mg at 12/27/18 0943  . ondansetron (ZOFRAN-ODT) disintegrating tablet 4 mg  4 mg Oral Q6H PRN Johnathan Hausen, MD       Or  . ondansetron [facility]) injection 4 mg  4 mg Intravenous Q6H PRN Johnathan Hausen, MD      . oxybutynin (DITROPAN-XL) 24 hr tablet 5 mg  5 mg Oral Daily Johnathan Hausen, MD   5 mg at 12/27/18 9379     Discharge Medications: Please see discharge summary for a list  of discharge medications.  Relevant Imaging Results:  Relevant Lab Results:   Additional Information SSN: [id number]  Pricilla Holm, [place]

## 2018-12-27 NOTE — Progress Notes (Signed)
Physical Therapy Treatment Patient Details Name: Jay Bailey MRN: [id number] DOB: 18-Apr-1941 Today's Date: 12/27/2018    History of Present Illness Pt is a 78 year old male s/p Repair of medial recurrent of left inguinal hernia with mesh    PT Comments    Pt up in bathroom with NT on arrival. Pt ambulated good distance around unit again today however reports he feels SNF upon d/c will be most appropriate.  Pt reports living alone and having no assist available upon d/c and requesting SNF.  Pt states he and his sister in law (not present for session) have discussed this with Education officer, museum.  If pt doesn't d/c to SNF, recommend HHPT.   Follow Up Recommendations  Home health PT(however pt feels SNF level more appropriate)     Equipment Recommendations  None recommended by PT    Recommendations for Other Services       Precautions / Restrictions Precautions Precautions: Other (comment) Precaution Comments: left inguinal incision Restrictions Weight Bearing Restrictions: No    Mobility  Bed Mobility               General bed mobility comments: pt up in bathroom with NT on arrival  Transfers Overall transfer level: Needs assistance Equipment used: Rolling walker (2 wheeled) Transfers: Sit to/from Stand Sit to Stand: Min guard         General transfer comment: utilizes UE for self assist, min/guard for safety   Ambulation/Gait Ambulation/Gait assistance: Min guard Gait Distance (Feet): 250 Feet Assistive device: Rolling walker (2 wheeled) Gait Pattern/deviations: Step-through pattern     General Gait Details: pt with decreased hip flexion and increased pelvic rotation bilaterally, also with increased bil hip internal rotation at rest and during ambulation (hx of bil THAs, and recurrent inguinal hernia repairs) which is likely close to pt's baseline, pt reports feeling a little more stiff and painful today with mobility, no unsteadiness or LOB   Stairs              Wheelchair Mobility    Modified Rankin (Stroke Patients Only)       Balance                                            Cognition Arousal/Alertness: Awake/alert Behavior During Therapy: WFL for tasks assessed/performed Overall Cognitive Status: Within Functional Limits for tasks assessed                                        Exercises      General Comments        Pertinent Vitals/Pain Pain Assessment: No/denies pain    Home Living                      Prior Function            PT Goals (current goals can now be found in the care plan section) Progress towards PT goals: Progressing toward goals    Frequency    Min 3X/week      PT Plan Other (comment);Discharge plan needs to be updated    Co-evaluation              AM-PAC PT "6 Clicks" Mobility   Outcome Measure  Help needed turning from  your back to your side while in a flat bed without using bedrails?: None Help needed moving from lying on your back to sitting on the side of a flat bed without using bedrails?: A Little Help needed moving to and from a bed to a chair (including a wheelchair)?: A Little Help needed standing up from a chair using your arms (e.g., wheelchair or bedside chair)?: A Little Help needed to walk in hospital room?: A Little Help needed climbing 3-5 steps with a railing? : A Little 6 Click Score: 19    End of Session   Activity Tolerance: Patient tolerated treatment well Patient left: with call bell/phone within reach;in bed   PT Visit Diagnosis: Difficulty in walking, not elsewhere classified (R26.2)     Time: 5885-0277 PT Time Calculation (min) (ACUTE ONLY): 14 min  Charges:  $Gait Training: 8-22 mins                     Carmelia Bake, PT, DPT Acute Rehabilitation Services Office: [phone] Pager: [place] E 12/27/2018, 12:30 PM

## 2018-12-27 NOTE — Progress Notes (Signed)
1 Day Post-Op   Subjective/Chief Complaint: Pt denies nausea.  Has tolerated clears.     Objective: Vital signs in last 24 hours: Temp:  [97.4 F (36.3 C)-98.3 F (36.8 C)] 97.4 F (36.3 C) (01/25 0939) Pulse Rate:  [49-82] 51 (01/25 0939) Resp:  [12-20] 17 (01/25 0939) BP: (97-150)/(52-90) 115/60 (01/25 0939) SpO2:  [95 %-100 %] 97 % (01/25 0939)    Intake/Output from previous day: 01/24 0701 - 01/25 0700 In: 3269 [P.O.:1100; I.V.:1985.4; IV Piggyback:183.6] Out: 2575 [Urine:2550; Blood:25] Intake/Output this shift: Total I/O In: 360 [P.O.:360] Out: 500 [Urine:500]  General appearance: alert, cooperative and mild distress Resp: breathing comfortably GI: soft, non distended. approp tender. Extremities: extremities normal, atraumatic, no cyanosis or edema Neurologic: Cranial nerves: VIII: hearing wearing hearing aid, still needs some repeated statements  A little shaky as well.  Lab Results:  Recent Labs    12/26/18 1117 12/27/18 0411  WBC 9.7 10.8*  HGB 14.7 12.5*  HCT 44.8 38.7*  PLT 165 179   BMET Recent Labs    12/26/18 1117  CREATININE 1.03   PT/INR No results for input(s): LABPROT, INR in the last 72 hours. ABG No results for input(s): PHART, HCO3 in the last 72 hours.  Invalid input(s): PCO2, PO2  Studies/Results: No results found.  Anti-infectives: Anti-infectives (From admission, onward)   Start     Dose/Rate Route Frequency Ordered Stop   12/26/18 1600  ceFAZolin (ANCEF) IVPB 2g/100 mL premix     2 g 200 mL/hr over 30 Minutes Intravenous Every 8 hours 12/26/18 1051 12/26/18 1652   12/26/18 0630  ceFAZolin (ANCEF) IVPB 2g/100 mL premix     2 g 200 mL/hr over 30 Minutes Intravenous On call to O.R. 12/26/18 0621 12/26/18 0800      Assessment/Plan: s/p Procedure(s): OPEN LEFT INGUINAL HERNIA WITH MESH (Left) Advance diet Pt appears frail and has no help at home.  This was a large hernia. He has needed PT and ongoing PT  recommended. Will consult social work to assist with placement.   LOS: 0 days    Stark Klein 12/27/2018

## 2018-12-27 NOTE — Evaluation (Signed)
Occupational Therapy Evaluation Patient Details Name: Jay Bailey MRN: [id number] DOB: 1941-07-21 Today's Date: 12/27/2018    History of Present Illness Pt is a 78 year old male s/p Repair of medial recurrent of left inguinal hernia with mesh   Clinical Impression   Pt admitted with the above. Pt currently with functional limitations due to the deficits listed below (see OT Problem List).  Pt will benefit from skilled OT to increase their safety and independence with ADL and functional mobility for ADL to facilitate discharge to venue listed below.      Follow Up Recommendations  SNF    Equipment Recommendations  None recommended by OT    Recommendations for Other Services       Precautions / Restrictions Precautions Precautions: Other (comment) Precaution Comments: left inguinal incision      Mobility Bed Mobility               General bed mobility comments: OOB  Transfers Overall transfer level: Needs assistance Equipment used: Rolling walker (2 wheeled) Transfers: Sit to/from [facility] Sit to Stand: Min assist Stand pivot transfers: Min assist       General transfer comment: utilizes UE for self assist, min/guard for safety     Balance Overall balance assessment: No apparent balance deficits (not formally assessed)(uses SPC at baseline, agreeable to use RW while healing, denies any falls)                                         ADL either performed or assessed with clinical judgement   ADL Overall ADL's : Needs assistance/impaired Eating/Feeding: Set up;Sitting   Grooming: Set up;Sitting   Upper Body Bathing: Set up   Lower Body Bathing: Moderate assistance;Sit to/from stand;Cueing for sequencing;Cueing for safety;With adaptive equipment   Upper Body Dressing : Set up;Sitting   Lower Body Dressing: Moderate assistance;Sit to/from stand;Cueing for sequencing;Cueing for safety;With adaptive equipment   Toilet  Transfer: Minimal assistance   Toileting- Clothing Manipulation and Hygiene: Minimal assistance;Sit to/from stand;Cueing for sequencing;Cueing for safety         General ADL Comments: pt feels he will need St SNF to increase I as he lives alone     Vision Patient Visual Report: No change from baseline              Pertinent Vitals/Pain Pain Assessment: No/denies pain     Hand Dominance     Extremity/Trunk Assessment Upper Extremity Assessment Upper Extremity Assessment: Generalized weakness           Communication Communication Communication: HOH(bil hearing aids)   Cognition Arousal/Alertness: Awake/alert Behavior During Therapy: WFL for tasks assessed/performed Overall Cognitive Status: Within Functional Limits for tasks assessed                                                Home Living Family/patient expects to be discharged to:: Private residence Living Arrangements: Alone Available Help at Discharge: Family;Available PRN/intermittently Type of Home: House Home Access: Level entry     Home Layout: One level               Home Equipment: Walker - 2 wheels;Cane - single point;Adaptive equipment Adaptive Equipment: Sock aid;Reacher  Prior Functioning/Environment Level of Independence: Independent with assistive device(s)        Comments: uses SPC at baseline        OT Problem List: Decreased strength;Decreased knowledge of use of DME or AE;Decreased safety awareness      OT Treatment/Interventions: Self-care/ADL training;Patient/family education;Therapeutic activities    OT Goals(Current goals can be found in the care plan section) Acute Rehab OT Goals Patient Stated Goal: got to SNF then home OT Goal Formulation: With patient Time For Goal Achievement: 01/01/19 Potential to Achieve Goals: Good  OT Frequency: Min 2X/week   Barriers to D/C: Decreased caregiver support             AM-PAC OT "6 Clicks"  Daily Activity     Outcome Measure Help from another person eating meals?: None Help from another person taking care of personal grooming?: A Little Help from another person toileting, which includes using toliet, bedpan, or urinal?: A Little Help from another person bathing (including washing, rinsing, drying)?: A Little Help from another person to put on and taking off regular upper body clothing?: None Help from another person to put on and taking off regular lower body clothing?: A Little 6 Click Score: 20   End of Session Equipment Utilized During Treatment: Rolling walker  Activity Tolerance: Patient tolerated treatment well Patient left: in chair  OT Visit Diagnosis: Unsteadiness on feet (R26.81);Muscle weakness (generalized) (M62.81);Other abnormalities of gait and mobility (R26.89);History of falling (Z91.81)                Time: 5993-5701 OT Time Calculation (min): 25 min Charges:  OT General Charges $OT Visit: 1 Visit OT Evaluation $OT Eval Moderate Complexity: 1 Mod OT Treatments $Self Care/Home Management : 8-22 mins  Jay Bailey, OT Acute Rehabilitation Services [phone] Office- [phone], [name] 12/27/2018, 4:45 PM

## 2018-12-27 NOTE — Clinical Social Work Note (Signed)
Clinical Social Work Assessment  Patient Details  Name: Jay Bailey MRN: [id number] Date of Birth: [birth year]  Date of referral:  12/27/18               Reason for consult:  Facility Placement                Permission sought to share information with:  Facility Art therapist granted to share information::  Yes, Verbal Permission Granted  Name::     Stewart Sasaki  Agency::  SNF  Relationship::  Sister  Contact Information:  [phone]  Housing/Transportation Living arrangements for the past 2 months:  Single Family Home Source of Information:  Patient, Siblings Patient Interpreter Needed:  None Criminal Activity/Legal Involvement Pertinent to Current Situation/Hospitalization:  No - Comment as needed Significant Relationships:  Siblings, Friend Lives with:  Self Do you feel safe going back to the place where you live?  Yes Need for family participation in patient care:  No (Coment)  Care giving concerns:  Patient admitted s/p repair of medial recurrent of left inguinal hernia with mesh. PT has recommended HHPT, however, patient and sister feel he needs short term SNF for rehab before returning home. Patient lives alone with little support nearby.   Social Worker assessment / plan:  CSW spoke with patient and sister, Darrick Penna, at bedside at sister's request. Patient currently lives alone and has little support available nearby. Patient and sister are concerned with him being alone at night and not being able to shower or cook for himself. They do not believe HH will be adequate or safe enough.  Explained SNF process to sister and patient. Explained that patient is currently in observation status and will need to be in inpatient status for 3 midnights in order for Medicare to pay. Will page MD regarding changing status. Shelly stated MD told them patient would be in hospital until at least Monday.  Discussed current PT recommendation with sister and patient.  Explained that patient walked 250' and was deemed appropriate for [facility]. Patient and sister agree that patient was "showing off" and felt better then than he does now. Explained that if patient goes to rehab and continues to progress rapidly, they may not agree to pay for long.   Patient has previously been to SNFs for rehab before. Preference for [facility] or [facility]. Does not want [facility].   CSW will page MD regarding status, complete FL2, and fax to facilities. Agreeable to CSW sending to SNFs in the area in case preferred facilities are full.   Employment status:  Retired Forensic scientist:  Medicare PT Recommendations:  Home with [place] / Referral to community resources:  [place]  Patient/Family's Response to care:  Appreciative of CSW involvement and explanation. Disagrees with PT recommendation and slightly annoyed at observation vs inpatient status.   Patient/Family's Understanding of and Emotional Response to Diagnosis, Current Treatment, and Prognosis:  Seemingly understands SNF process and explanation of need for status change to inpatient in order for Medicare to pay for SNF.   Emotional Assessment Appearance:  Appears stated age Attitude/Demeanor/Rapport:    Affect (typically observed):  Accepting, Pleasant, Appropriate Orientation:  Oriented to Self, Oriented to Place, Oriented to  Time, Oriented to Situation Alcohol / Substance use:  Not Applicable Psych involvement (Current and /or in the community):  No (Comment)  Discharge Needs  Concerns to be addressed:  Care Coordination Readmission within the last 30 days:  No Current discharge risk:  Physical  Impairment, Lives alone Barriers to Discharge:  Continued Medical Work up   [employer], [place] 12/27/2018, 10:18 AM

## 2018-12-28 ENCOUNTER — Encounter (HOSPITAL_COMMUNITY): Payer: Self-pay | Admitting: Surgery

## 2018-12-28 NOTE — Progress Notes (Signed)
2 Days Post-Op   Subjective/Chief Complaint: Pt tolerated diet advance.  Wants to shower.  SW spoke to patient and sister.  FL2 sent out.     Objective: Vital signs in last 24 hours: Temp:  [97.4 F (36.3 C)-98.6 F (37 C)] 98.6 F (37 C) (01/26 0644) Pulse Rate:  [47-53] 48 (01/26 0644) Resp:  [17-18] 18 (01/25 2144) BP: (102-125)/(54-60) 102/56 (01/26 0644) SpO2:  [95 %-97 %] 95 % (01/26 0644) Last BM Date: 12/25/18  Intake/Output from previous day: 01/25 0701 - 01/26 0700 In: 600 [P.O.:600] Out: 2400 [Urine:2400] Intake/Output this shift: No intake/output data recorded.  General appearance: alert, cooperative and mild distress Resp: breathing comfortably GI: soft, non distended. approp tender.  Wound c/d/i.   Extremities: extremities normal, atraumatic, no cyanosis or edema Neurologic: Cranial nerves: VIII: hearing wearing hearing aid, still needs some repeated statements  Less shaky  Lab Results:  Recent Labs    12/26/18 1117 12/27/18 0411  WBC 9.7 10.8*  HGB 14.7 12.5*  HCT 44.8 38.7*  PLT 165 179   BMET Recent Labs    12/26/18 1117  CREATININE 1.03   PT/INR No results for input(s): LABPROT, INR in the last 72 hours. ABG No results for input(s): PHART, HCO3 in the last 72 hours.  Invalid input(s): PCO2, PO2  Studies/Results: No results found.  Anti-infectives: Anti-infectives (From admission, onward)   Start     Dose/Rate Route Frequency Ordered Stop   12/26/18 1600  ceFAZolin (ANCEF) IVPB 2g/100 mL premix     2 g 200 mL/hr over 30 Minutes Intravenous Every 8 hours 12/26/18 1051 12/26/18 1652   12/26/18 0630  ceFAZolin (ANCEF) IVPB 2g/100 mL premix     2 g 200 mL/hr over 30 Minutes Intravenous On call to O.R. 12/26/18 0621 12/26/18 0800      Assessment/Plan: s/p Procedure(s): OPEN LEFT INGUINAL HERNIA WITH MESH (Left) Diet as tolerated.   Pt/OT This was a large hernia.  SW working on [employer] to shower.     LOS: 1 day     Stark Klein 12/28/2018

## 2018-12-29 DIAGNOSIS — G809 Cerebral palsy, unspecified: Secondary | ICD-10-CM

## 2018-12-29 LAB — GLUCOSE, CAPILLARY: Glucose-Capillary: 93 mg/dL (ref 70–99)

## 2018-12-29 NOTE — Progress Notes (Signed)
Occupational Therapy Treatment Patient Details Name: Jay Bailey MRN: [id number] DOB: [birth year] Today's Date: 12/29/2018    History of present illness Pt is a 78 year old male s/p Repair of medial recurrent of left inguinal hernia with mesh      Follow Up Recommendations  SNF    Equipment Recommendations  None recommended by OT    Recommendations for Other Services      Precautions / Restrictions Precautions Precautions: Other (comment) Precaution Comments: left inguinal incision       Mobility Bed Mobility               General bed mobility comments: OOB  Transfers Overall transfer level: Needs assistance Equipment used: Rolling walker (2 wheeled) Transfers: Sit to/from [facility] Sit to Stand: Min assist Stand pivot transfers: Min assist       General transfer comment: utilizes UE for self assist, min/guard for safety     Balance Overall balance assessment: No apparent balance deficits (not formally assessed)(uses SPC at baseline, agreeable to use RW while healing, denies any falls)                                         ADL either performed or assessed with clinical judgement   ADL Overall ADL's : Needs assistance/impaired                         Toilet Transfer: RW;Ambulation;Comfort height toilet;Min guard   Toileting- Clothing Manipulation and Hygiene: Min guard;Sit to/from stand       Functional mobility during ADLs: Min guard;Rolling walker;Cueing for sequencing;Cueing for safety       Vision Patient Visual Report: No change from baseline            Cognition Arousal/Alertness: Awake/alert Behavior During Therapy: WFL for tasks assessed/performed Overall Cognitive Status: Within Functional Limits for tasks assessed                                                General Comments      Pertinent Vitals/ Pain       Pain Assessment: No/denies pain          Frequency  Min 2X/week        Progress Toward Goals  OT Goals(current goals can now be found in the care plan section)  Progress towards OT goals: Progressing toward goals     Plan Discharge plan remains appropriate       AM-PAC OT "6 Clicks" Daily Activity     Outcome Measure   Help from another person eating meals?: None Help from another person taking care of personal grooming?: A Little Help from another person toileting, which includes using toliet, bedpan, or urinal?: A Little Help from another person bathing (including washing, rinsing, drying)?: A Little Help from another person to put on and taking off regular upper body clothing?: None Help from another person to put on and taking off regular lower body clothing?: A Little 6 Click Score: 20    End of Session Equipment Utilized During Treatment: Rolling walker  OT Visit Diagnosis: Unsteadiness on feet (R26.81);Muscle weakness (generalized) (M62.81);Other abnormalities of gait and mobility (R26.89);History of falling (Z91.81)   Activity Tolerance Patient tolerated  treatment well   Patient Left in chair   Nurse Communication Mobility status        Time: 7618-4859 OT Time Calculation (min): 22 min  Charges: OT General Charges $OT Visit: 1 Visit OT Treatments $Self Care/Home Management : 8-22 mins  Jay Bailey, [place] [phone] Office- [phone]      [place], Jay Bailey 12/29/2018, 4:10 PM

## 2018-12-29 NOTE — Progress Notes (Signed)
Pt had a little breakthrough bleeding at incision area, put a gauze on it even though it did not seem to be actively bleeding at this time.  Will continue to monitor.

## 2018-12-29 NOTE — Progress Notes (Signed)
Pt verbalized he has not have BM since Thursday(12/25/2018). Requesting something to help him to have BM.  Paged oncall Dr Alphonsa Overall. MD call back and said encourage pt to drink plenty of water and let  Dr Hassell Done to handle in the morning. Pt has active bowel sound and ambulating in hall way. Will continue to monitor.

## 2018-12-29 NOTE — Progress Notes (Signed)
Patient ID: Jay Bailey, male   DOB: 1941-03-17, 78 y.o.   MRN: [id number] [facility] Surgery Progress Note:   3 Days Post-Op  Subjective: Mental status is clear.   Objective: Vital signs in last 24 hours: Temp:  [97.8 F (36.6 C)-98.5 F (36.9 C)] 98.1 F (36.7 C) (01/27 0653) Pulse Rate:  [43-50] 43 (01/27 0653) Resp:  [16-17] 16 (01/27 0653) BP: (122-128)/(56-63) 128/61 (01/27 0653) SpO2:  [95 %-100 %] 95 % (01/27 0653) Weight:  [84.3 kg] 84.3 kg (01/27 0700)  Intake/Output from previous day: 01/26 0701 - 01/27 0700 In: 1440 [P.O.:1440] Out: 2150 [Urine:2150] Intake/Output this shift: Total I/O In: -  Out: 400 [Urine:400]  Physical Exam: Work of breathing is not labored;  Long transverse incision is covered with Dermabond; dependent ecchymosis  Lab Results:  No results found for this or any previous visit (from the past 48 hour(s)).  Radiology/Results: No results found.  Anti-infectives: Anti-infectives (From admission, onward)   Start     Dose/Rate Route Frequency Ordered Stop   12/26/18 1600  ceFAZolin (ANCEF) IVPB 2g/100 mL premix     2 g 200 mL/hr over 30 Minutes Intravenous Every 8 hours 12/26/18 1051 12/26/18 1652   12/26/18 0630  ceFAZolin (ANCEF) IVPB 2g/100 mL premix     2 g 200 mL/hr over 30 Minutes Intravenous On call to O.R. 12/26/18 0621 12/26/18 0800      Assessment/Plan: Problem List: Patient Active Problem List   Diagnosis Date Noted  . CP (cerebral palsy) ([place]) 12/29/2018  . Adult failure to thrive 12/27/2018  . Recurrent left inguinal hernia 12/26/2018    Working on mobility and getting better.  Hopefully ready for discharge tomorrow to [facility].   3 Days Post-Op    LOS: 2 days   Matt B. Hassell Done, MD, [facility] [phone] beeper [phone]  12/29/2018 10:48 AM

## 2018-12-30 DIAGNOSIS — M6281 Muscle weakness (generalized): Secondary | ICD-10-CM | POA: Diagnosis not present

## 2018-12-30 DIAGNOSIS — Z48816 Encounter for surgical aftercare following surgery on the genitourinary system: Secondary | ICD-10-CM | POA: Diagnosis not present

## 2018-12-30 DIAGNOSIS — Z9889 Other specified postprocedural states: Secondary | ICD-10-CM | POA: Diagnosis not present

## 2018-12-30 DIAGNOSIS — R2689 Other abnormalities of gait and mobility: Secondary | ICD-10-CM | POA: Diagnosis not present

## 2018-12-30 DIAGNOSIS — K5909 Other constipation: Secondary | ICD-10-CM | POA: Diagnosis not present

## 2018-12-30 DIAGNOSIS — I10 Essential (primary) hypertension: Secondary | ICD-10-CM | POA: Diagnosis not present

## 2018-12-30 DIAGNOSIS — R29898 Other symptoms and signs involving the musculoskeletal system: Secondary | ICD-10-CM | POA: Diagnosis not present

## 2018-12-30 DIAGNOSIS — R41841 Cognitive communication deficit: Secondary | ICD-10-CM | POA: Diagnosis not present

## 2018-12-30 DIAGNOSIS — Z23 Encounter for immunization: Secondary | ICD-10-CM | POA: Diagnosis not present

## 2018-12-30 DIAGNOSIS — R2681 Unsteadiness on feet: Secondary | ICD-10-CM | POA: Diagnosis not present

## 2018-12-30 DIAGNOSIS — Z7401 Bed confinement status: Secondary | ICD-10-CM | POA: Diagnosis not present

## 2018-12-30 DIAGNOSIS — K4091 Unilateral inguinal hernia, without obstruction or gangrene, recurrent: Secondary | ICD-10-CM | POA: Diagnosis not present

## 2018-12-30 DIAGNOSIS — R278 Other lack of coordination: Secondary | ICD-10-CM | POA: Diagnosis not present

## 2018-12-30 DIAGNOSIS — G808 Other cerebral palsy: Secondary | ICD-10-CM | POA: Diagnosis not present

## 2018-12-30 DIAGNOSIS — Z8719 Personal history of other diseases of the digestive system: Secondary | ICD-10-CM | POA: Diagnosis not present

## 2018-12-30 DIAGNOSIS — E7849 Other hyperlipidemia: Secondary | ICD-10-CM | POA: Diagnosis not present

## 2018-12-30 DIAGNOSIS — M2550 Pain in unspecified joint: Secondary | ICD-10-CM | POA: Diagnosis not present

## 2018-12-30 DIAGNOSIS — R5381 Other malaise: Secondary | ICD-10-CM | POA: Diagnosis not present

## 2018-12-30 MED ORDER — HYDROCODONE-ACETAMINOPHEN 5-325 MG PO TABS: 1.0000 | ORAL_TABLET | ORAL | 0 refills | Status: DC | PRN

## 2018-12-30 MED ORDER — ONDANSETRON 4 MG PO TBDP: 4.0000 mg | ORAL_TABLET | Freq: Four times a day (QID) | ORAL | 0 refills | Status: DC | PRN

## 2018-12-30 NOTE — Discharge Summary (Signed)
Physician Discharge Summary  Patient ID: Jay Bailey MRN: [id number] DOB/AGE: Jan 05, 1941 78 y.o.  PCP: Harlan Stains, MD  Admit date: 12/26/2018 Discharge date: 12/30/2018  Admission Diagnoses:  4x recurrent left inguinal hernia  Discharge Diagnoses:  same  Principal Problem:   Recurrent left inguinal hernia Active Problems:   Adult failure to thrive   CP (cerebral palsy) [facility])   Surgery:  Open revision of left inguinal hernia repair  Discharged Condition: improved  Hospital Course:   The patient had surgery on Friday.  He had a 5th [facility] repair for a medial, painful recurrence.  Since he has underlying CP, he was assisted in mobility until he could go to a nursing facility to prepare him to return to independent living.  His wound was healing well.    Consults: case management  Significant Diagnostic Studies: none    Discharge Exam: Blood pressure 126/62, pulse (!) 45, temperature 98.3 F (36.8 C), temperature source Oral, resp. rate 16, height 5\' 8"  (1.727 m), weight 84.3 kg, SpO2 95 %. incisons with Dermabond;  Dependent  Edema and ecchymosis as expected.  Disposition: Discharge disposition: 03-Skilled Nursing Facility       Discharge Instructions    Diet - low sodium heart healthy   Complete by:  As directed    Discharge instructions   Complete by:  As directed    May shower ad lib Miralax for constipation   Increase activity slowly   Complete by:  As directed      Allergies as of 12/30/2018   No Known Allergies     Medication List    TAKE these medications   amLODipine 2.5 MG tablet Commonly known as:  NORVASC Take 2.5 mg by mouth daily.   amoxicillin 500 MG capsule Commonly known as:  AMOXIL Take 2,000 mg by mouth See admin instructions. For Dental Procedures   ascorbic acid 1000 MG tablet Commonly known as:  VITAMIN C Take 1,000 mg by mouth daily.   aspirin 81 MG tablet Take 81 mg by mouth daily.   atorvastatin 80 MG tablet Commonly  known as:  LIPITOR Take 80 mg by mouth daily.   folic acid 409 MCG tablet Commonly known as:  FOLVITE Take 400 mcg by mouth daily.   GLUCOSAMINE CHONDR 1500 COMPLX PO Take 1 tablet by mouth daily.   HYDROcodone-acetaminophen 5-325 MG tablet Commonly known as:  NORCO/VICODIN Take 1-2 tablets by mouth every 4 (four) hours as needed for moderate pain.   irbesartan 300 MG tablet Commonly known as:  AVAPRO Take 300 mg by mouth daily.   LINZESS 145 MCG Caps capsule Generic drug:  linaclotide Take 145 mcg by mouth daily as needed (constipation).   metFORMIN 500 MG tablet Commonly known as:  GLUCOPHAGE Take 500 mg by mouth every evening.   multivitamin tablet Take 1 tablet by mouth daily.   ondansetron 4 MG disintegrating tablet Commonly known as:  ZOFRAN-ODT Take 1 tablet (4 mg total) by mouth every 6 (six) hours as needed for nausea.   oxybutynin 5 MG 24 hr tablet Commonly known as:  DITROPAN-XL TAKE 1 TABLET ONCE DAILY.   polyethylene glycol powder powder Commonly known as:  GLYCOLAX/MIRALAX Take 17 g by mouth daily as needed for moderate constipation.   vitamin E 400 UNIT capsule Take 400 Units by mouth daily.      Follow-up Information    Johnathan Hausen, MD. Schedule an appointment as soon as possible for a visit in 4 week(s).   Specialty:  General Surgery Contact information: [address] [place] [address] [phone]           Signed: Pedro Earls 12/30/2018, 7:41 AM

## 2018-12-30 NOTE — Care Management Important Message (Signed)
Important Message  Patient Details  Name: Jay Bailey MRN: [id number] Date of Birth: [birth year]   Medicare Important Message Given:  Yes    Kerin Salen 12/30/2018, 10:44 AMImportant Message  Patient Details  Name: Jay Bailey MRN: [id number] Date of Birth: 1940-12-10   Medicare Important Message Given:  Yes    Kerin Salen 12/30/2018, 10:42 AM

## 2018-12-30 NOTE — Clinical Social Work Placement (Signed)
   CLINICAL SOCIAL WORK PLACEMENT  NOTE  Date:  12/30/2018  Patient Details  Name: Jay Bailey MRN: [id number] Date of Birth: 1941/05/24  Clinical Social Work is seeking post-discharge placement for this patient at the [place] level of care (*CSW will initial, date and re-position this form in  chart as items are completed):  Yes   Patient/family provided with [place] Work Department's list of facilities offering this level of care within the geographic area requested by the patient (or if unable, by the patient's family).  Yes   Patient/family informed of their freedom to choose among providers that offer the needed level of care, that participate in Medicare, Medicaid or managed care program needed by the patient, have an available bed and are willing to accept the patient.  Yes   Patient/family informed of [facility]'s ownership interest in [facility] and [facility], as well as of the fact that they are under no obligation to receive care at these facilities.  PASRR submitted to EDS on       PASRR number received on       Existing PASRR number confirmed on 12/30/18     FL2 transmitted to all facilities in geographic area requested by pt/family on 12/30/18     FL2 transmitted to all facilities within larger geographic area on       Patient informed that his/her managed care company has contracts with or will negotiate with certain facilities, including the following:            Patient/family informed of bed offers received.  Patient chooses bed at [facility]     Physician recommends and patient chooses bed at      Patient to be transferred to [facility] on 12/30/18.  Patient to be transferred to facility by ptar     Patient family notified on 12/30/18 of transfer.  Name of family member notified:  spoke with sister in law     PHYSICIAN       Additional Comment:     _______________________________________________ Wende Neighbors, LCSW 12/30/2018, 8:49 AM

## 2018-12-30 NOTE — Discharge Instructions (Signed)
Inguinal Hernia, Adult °An inguinal hernia is when fat or your intestines push through a weak spot in a muscle where your leg meets your lower belly (groin). This causes a rounded lump (bulge). This kind of hernia could also be: °· In your scrotum, if you are male. °· In folds of skin around your vagina, if you are male. °There are three types of inguinal hernias. These include: °· Hernias that can be pushed back into the belly (are reducible). This type rarely causes pain. °· Hernias that cannot be pushed back into the belly (are incarcerated). °· Hernias that cannot be pushed back into the belly and lose their blood supply (are strangulated). This type needs emergency surgery. °If you do not have symptoms, you may not need treatment. If you have symptoms or a large hernia, you may need surgery. °Follow these instructions at home: °Lifestyle °· Do these things if told by your doctor so you do not have trouble pooping (constipation): °? Drink enough fluid to keep your pee (urine) pale yellow. °? Eat foods that have a lot of fiber. These include fresh fruits and vegetables, whole grains, and beans. °? Limit foods that are high in fat and processed sugars. These include foods that are fried or sweet. °? Take medicine for trouble pooping. °· Avoid lifting heavy objects. °· Avoid standing for long amounts of time. °· Do not use any products that contain nicotine or tobacco. These include cigarettes and e-cigarettes. If you need help quitting, ask your doctor. °· Stay at a healthy weight. °General instructions °· You may try to push your hernia in by very gently pressing on it when you are lying down. Do not try to force the bulge back in if it will not push in easily. °· Watch your hernia for any changes in shape, size, or color. Tell your doctor if you see any changes. °· Take over-the-counter and prescription medicines only as told by your doctor. °· Keep all follow-up visits as told by your doctor. This is  important. °Contact a doctor if: °· You have a fever. °· You have new symptoms. °· Your symptoms get worse. °Get help right away if: °· The area where your leg meets your lower belly has: °? Pain that gets worse suddenly. °? A bulge that gets bigger suddenly, and it does not get smaller after that. °? A bulge that turns red or purple. °? A bulge that is painful when you touch it. °· You are a man, and your scrotum: °? Suddenly feels painful. °? Suddenly changes in size. °· You cannot push the hernia in by very gently pressing on it when you are lying down. Do not try to force the bulge back in if it will not push in easily. °· You feel sick to your stomach (nauseous), and that feeling does not go away. °· You throw up (vomit), and that keeps happening. °· You have a fast heartbeat. °· You cannot poop (have a bowel movement) or pass gas. °These symptoms may be an emergency. Do not wait to see if the symptoms will go away. Get medical help right away. Call your local emergency services (911 in the [place] °Summary °· An inguinal hernia is when fat or your intestines push through a weak spot in a muscle where your leg meets your lower belly (groin). This causes a rounded lump (bulge). °· If you do not have symptoms, you may not need treatment. If you have symptoms or a large hernia, you   your intestines push through a weak spot in a muscle where your leg meets your lower belly (groin). This causes a rounded lump (bulge).  · If you do not have symptoms, you may not need treatment. If you have symptoms or a large hernia, you may need surgery.  · Avoid lifting heavy objects. Also avoid standing for long amounts of time.  · Do not try to force the bulge back in if it will not push in easily.  This information is not intended to replace advice given to you by your health care provider. Make sure you discuss any questions you have with your health care provider.  Document Released: 12/20/2006 Document Revised: 12/21/2017 Document Reviewed: 08/21/2017  Elsevier Interactive Patient Education © 2019 Elsevier Inc.

## 2018-12-30 NOTE — Progress Notes (Signed)
Attempted to call report at [facility]. No answer, placed on hold floor number written in packet.

## 2018-12-31 DIAGNOSIS — Z9889 Other specified postprocedural states: Secondary | ICD-10-CM | POA: Diagnosis not present

## 2018-12-31 DIAGNOSIS — Z8719 Personal history of other diseases of the digestive system: Secondary | ICD-10-CM | POA: Diagnosis not present

## 2018-12-31 DIAGNOSIS — I10 Essential (primary) hypertension: Secondary | ICD-10-CM | POA: Diagnosis not present

## 2018-12-31 DIAGNOSIS — E7849 Other hyperlipidemia: Secondary | ICD-10-CM | POA: Diagnosis not present

## 2019-01-01 DIAGNOSIS — Z9889 Other specified postprocedural states: Secondary | ICD-10-CM | POA: Diagnosis not present

## 2019-01-01 DIAGNOSIS — Z8719 Personal history of other diseases of the digestive system: Secondary | ICD-10-CM | POA: Diagnosis not present

## 2019-01-01 DIAGNOSIS — G808 Other cerebral palsy: Secondary | ICD-10-CM | POA: Diagnosis not present

## 2019-01-01 DIAGNOSIS — K4091 Unilateral inguinal hernia, without obstruction or gangrene, recurrent: Secondary | ICD-10-CM | POA: Diagnosis not present

## 2019-01-02 ENCOUNTER — Other Ambulatory Visit: Payer: Self-pay | Admitting: *Deleted

## 2019-01-05 NOTE — Patient Outreach (Signed)
[facility]) Care Management  01/05/2019  DARROLD BEZEK 12-19-1940 [id number]   Facility site visit to [facility] and [facility]. Collaboration with [facility] tem member concerning patient's progress, discharge plan and potential care management needs. Facility rep not available at this time. Patient admitted to SNF on 12/30/18 after a hospitalization for hernia repair and supra pubic prostatectomy. Other diagnosis noted are diabetes and cerebral palsy.  Planned discharge date is not set and discharge disposition is home with little support noted.  Patient will have [facility] at discharge. Patient was evaluated for community based chronic disease management services with [facility] care Management Program as a benefit of patient's Next Gen Medicare.   Went to patient's bedside to speak with patient and further assess for care management needs.  Patient resting in bed stating he felt he is doing well. Patient states his main support person is his sister in law Kylil Swopes . Patient endorses their primary care provider to be Harlan Stains MD. Patient states transportation needs will be provided by his sister in law. Patient states medication management provided by himself and his sister in law.  Patient discussed medication cost is not currently a barrier to care. Introduced Psychologist, forensic and gave patient [facility] Patient Packet with my contact information included.  Patient agreed to [facility] services for transition of care needs.  Patient stated he lives alone and does not have much caregiver support.  Patient gave [phone] as the best number to reach him. Patient also gave verbal permission to speak with sister in law [place] at [phone].   Referral placed for [facility] to engage patient for transition of care and evaluate for monthly home visits.   [facility] Care Management services do not interfere with or  replace any services arranged by the facility discharge planner.   Plan to make [facility] UM team member aware [facility] will be following for care management.   For additional questions please contact:   Demarquez Ciolek RN, [facility] Liaison  [phone]) Business Mobile [phone]) Toll free office

## 2019-01-06 DIAGNOSIS — I10 Essential (primary) hypertension: Secondary | ICD-10-CM | POA: Diagnosis not present

## 2019-01-06 DIAGNOSIS — K5909 Other constipation: Secondary | ICD-10-CM | POA: Diagnosis not present

## 2019-01-06 DIAGNOSIS — E7849 Other hyperlipidemia: Secondary | ICD-10-CM | POA: Diagnosis not present

## 2019-01-06 DIAGNOSIS — Z9889 Other specified postprocedural states: Secondary | ICD-10-CM | POA: Diagnosis not present

## 2019-01-09 DIAGNOSIS — G809 Cerebral palsy, unspecified: Secondary | ICD-10-CM | POA: Diagnosis not present

## 2019-01-09 DIAGNOSIS — Z7984 Long term (current) use of oral hypoglycemic drugs: Secondary | ICD-10-CM | POA: Diagnosis not present

## 2019-01-09 DIAGNOSIS — E119 Type 2 diabetes mellitus without complications: Secondary | ICD-10-CM | POA: Diagnosis not present

## 2019-01-09 DIAGNOSIS — K5909 Other constipation: Secondary | ICD-10-CM | POA: Diagnosis not present

## 2019-01-09 DIAGNOSIS — Z48815 Encounter for surgical aftercare following surgery on the digestive system: Secondary | ICD-10-CM | POA: Diagnosis not present

## 2019-01-09 DIAGNOSIS — E8881 Metabolic syndrome: Secondary | ICD-10-CM | POA: Diagnosis not present

## 2019-01-09 DIAGNOSIS — I10 Essential (primary) hypertension: Secondary | ICD-10-CM | POA: Diagnosis not present

## 2019-01-10 DIAGNOSIS — E8881 Metabolic syndrome: Secondary | ICD-10-CM | POA: Diagnosis not present

## 2019-01-10 DIAGNOSIS — G809 Cerebral palsy, unspecified: Secondary | ICD-10-CM | POA: Diagnosis not present

## 2019-01-10 DIAGNOSIS — Z48815 Encounter for surgical aftercare following surgery on the digestive system: Secondary | ICD-10-CM | POA: Diagnosis not present

## 2019-01-10 DIAGNOSIS — K5909 Other constipation: Secondary | ICD-10-CM | POA: Diagnosis not present

## 2019-01-10 DIAGNOSIS — I10 Essential (primary) hypertension: Secondary | ICD-10-CM | POA: Diagnosis not present

## 2019-01-10 DIAGNOSIS — E119 Type 2 diabetes mellitus without complications: Secondary | ICD-10-CM | POA: Diagnosis not present

## 2019-01-13 DIAGNOSIS — K5909 Other constipation: Secondary | ICD-10-CM | POA: Diagnosis not present

## 2019-01-13 DIAGNOSIS — I10 Essential (primary) hypertension: Secondary | ICD-10-CM | POA: Diagnosis not present

## 2019-01-13 DIAGNOSIS — G809 Cerebral palsy, unspecified: Secondary | ICD-10-CM | POA: Diagnosis not present

## 2019-01-13 DIAGNOSIS — E8881 Metabolic syndrome: Secondary | ICD-10-CM | POA: Diagnosis not present

## 2019-01-13 DIAGNOSIS — E119 Type 2 diabetes mellitus without complications: Secondary | ICD-10-CM | POA: Diagnosis not present

## 2019-01-13 DIAGNOSIS — Z48815 Encounter for surgical aftercare following surgery on the digestive system: Secondary | ICD-10-CM | POA: Diagnosis not present

## 2019-01-14 DIAGNOSIS — E119 Type 2 diabetes mellitus without complications: Secondary | ICD-10-CM | POA: Diagnosis not present

## 2019-01-14 DIAGNOSIS — K5909 Other constipation: Secondary | ICD-10-CM | POA: Diagnosis not present

## 2019-01-14 DIAGNOSIS — E8881 Metabolic syndrome: Secondary | ICD-10-CM | POA: Diagnosis not present

## 2019-01-14 DIAGNOSIS — Z48815 Encounter for surgical aftercare following surgery on the digestive system: Secondary | ICD-10-CM | POA: Diagnosis not present

## 2019-01-14 DIAGNOSIS — G809 Cerebral palsy, unspecified: Secondary | ICD-10-CM | POA: Diagnosis not present

## 2019-01-14 DIAGNOSIS — I10 Essential (primary) hypertension: Secondary | ICD-10-CM | POA: Diagnosis not present

## 2019-01-15 DIAGNOSIS — E8881 Metabolic syndrome: Secondary | ICD-10-CM | POA: Diagnosis not present

## 2019-01-15 DIAGNOSIS — I10 Essential (primary) hypertension: Secondary | ICD-10-CM | POA: Diagnosis not present

## 2019-01-15 DIAGNOSIS — G809 Cerebral palsy, unspecified: Secondary | ICD-10-CM | POA: Diagnosis not present

## 2019-01-15 DIAGNOSIS — K5909 Other constipation: Secondary | ICD-10-CM | POA: Diagnosis not present

## 2019-01-15 DIAGNOSIS — Z48815 Encounter for surgical aftercare following surgery on the digestive system: Secondary | ICD-10-CM | POA: Diagnosis not present

## 2019-01-15 DIAGNOSIS — E119 Type 2 diabetes mellitus without complications: Secondary | ICD-10-CM | POA: Diagnosis not present

## 2019-01-20 ENCOUNTER — Other Ambulatory Visit: Payer: Self-pay | Admitting: *Deleted

## 2019-01-20 ENCOUNTER — Encounter: Payer: Self-pay | Admitting: *Deleted

## 2019-01-20 DIAGNOSIS — E119 Type 2 diabetes mellitus without complications: Secondary | ICD-10-CM | POA: Diagnosis not present

## 2019-01-20 DIAGNOSIS — G809 Cerebral palsy, unspecified: Secondary | ICD-10-CM | POA: Diagnosis not present

## 2019-01-20 DIAGNOSIS — I10 Essential (primary) hypertension: Secondary | ICD-10-CM | POA: Diagnosis not present

## 2019-01-20 DIAGNOSIS — K5909 Other constipation: Secondary | ICD-10-CM | POA: Diagnosis not present

## 2019-01-20 DIAGNOSIS — Z48815 Encounter for surgical aftercare following surgery on the digestive system: Secondary | ICD-10-CM | POA: Diagnosis not present

## 2019-01-20 DIAGNOSIS — E8881 Metabolic syndrome: Secondary | ICD-10-CM | POA: Diagnosis not present

## 2019-01-20 NOTE — Patient Outreach (Signed)
[facility]) Care Management  01/20/2019  Jay Bailey June 13, 1941 [id number]    Received update on referral 2/14 of pt discharged from SNF.  Initial outreach completed today however unsuccessful. RN able to leave a HIPAA approved voice message requesting a call back. Will further engage at that time. Will also rescheduled another outreach call of inquiry for [facility] services.   Raina Mina, RN Care Management Coordinator [place] Office [phone]

## 2019-01-22 DIAGNOSIS — I10 Essential (primary) hypertension: Secondary | ICD-10-CM | POA: Diagnosis not present

## 2019-01-22 DIAGNOSIS — G809 Cerebral palsy, unspecified: Secondary | ICD-10-CM | POA: Diagnosis not present

## 2019-01-22 DIAGNOSIS — E119 Type 2 diabetes mellitus without complications: Secondary | ICD-10-CM | POA: Diagnosis not present

## 2019-01-22 DIAGNOSIS — Z48815 Encounter for surgical aftercare following surgery on the digestive system: Secondary | ICD-10-CM | POA: Diagnosis not present

## 2019-01-22 DIAGNOSIS — E8881 Metabolic syndrome: Secondary | ICD-10-CM | POA: Diagnosis not present

## 2019-01-22 DIAGNOSIS — K5909 Other constipation: Secondary | ICD-10-CM | POA: Diagnosis not present

## 2019-01-26 ENCOUNTER — Other Ambulatory Visit: Payer: Self-pay | Admitting: *Deleted

## 2019-01-26 NOTE — Patient Outreach (Signed)
[facility]) Care Management  01/26/2019  Jay Bailey 1941/06/15 [id number]    RN spoke with pt today and introduced the [facility] program, services and purpose for today's call. Pt receptive as identifiers were confirmed. Pt states he has an involved [place] with PT and a nurse who visits weekly. States he does not need any other services at this time although appreciative. RN inquired if pt wishes to review his discharge orders (delcined). RN also inquired if pt wishes to have [facility] follow up weekly over the next few weeks for availabilty for community resources or any related services to assist him further as he continues to recovery (declined). RN verified pt's understanding and offered to contact his daughter-in-law and present such services once again if she felt he needed further assist (declined). Pt very admant about no needs at this time and states he has [facility] number if needed in the future. No additional needs presented at this time although pt is eligible he has opt to decline services. Pt states he has all the services he needs at this time. RN will notify pt's provider of pt's disposition with [facility] services and close this case.  Raina Mina, RN Care Management Coordinator [place] Office [phone]

## 2019-01-27 DIAGNOSIS — Z48815 Encounter for surgical aftercare following surgery on the digestive system: Secondary | ICD-10-CM | POA: Diagnosis not present

## 2019-01-27 DIAGNOSIS — K5909 Other constipation: Secondary | ICD-10-CM | POA: Diagnosis not present

## 2019-01-27 DIAGNOSIS — E8881 Metabolic syndrome: Secondary | ICD-10-CM | POA: Diagnosis not present

## 2019-01-27 DIAGNOSIS — I10 Essential (primary) hypertension: Secondary | ICD-10-CM | POA: Diagnosis not present

## 2019-01-27 DIAGNOSIS — G809 Cerebral palsy, unspecified: Secondary | ICD-10-CM | POA: Diagnosis not present

## 2019-01-27 DIAGNOSIS — E119 Type 2 diabetes mellitus without complications: Secondary | ICD-10-CM | POA: Diagnosis not present

## 2019-01-29 DIAGNOSIS — E8881 Metabolic syndrome: Secondary | ICD-10-CM | POA: Diagnosis not present

## 2019-01-29 DIAGNOSIS — I10 Essential (primary) hypertension: Secondary | ICD-10-CM | POA: Diagnosis not present

## 2019-01-29 DIAGNOSIS — E119 Type 2 diabetes mellitus without complications: Secondary | ICD-10-CM | POA: Diagnosis not present

## 2019-01-29 DIAGNOSIS — Z48815 Encounter for surgical aftercare following surgery on the digestive system: Secondary | ICD-10-CM | POA: Diagnosis not present

## 2019-01-29 DIAGNOSIS — K5909 Other constipation: Secondary | ICD-10-CM | POA: Diagnosis not present

## 2019-01-29 DIAGNOSIS — G809 Cerebral palsy, unspecified: Secondary | ICD-10-CM | POA: Diagnosis not present

## 2019-02-04 DIAGNOSIS — E8881 Metabolic syndrome: Secondary | ICD-10-CM | POA: Diagnosis not present

## 2019-02-04 DIAGNOSIS — R001 Bradycardia, unspecified: Secondary | ICD-10-CM | POA: Diagnosis not present

## 2019-02-04 DIAGNOSIS — K5909 Other constipation: Secondary | ICD-10-CM | POA: Diagnosis not present

## 2019-02-04 DIAGNOSIS — G809 Cerebral palsy, unspecified: Secondary | ICD-10-CM | POA: Diagnosis not present

## 2019-02-04 DIAGNOSIS — E119 Type 2 diabetes mellitus without complications: Secondary | ICD-10-CM | POA: Diagnosis not present

## 2019-02-04 DIAGNOSIS — I10 Essential (primary) hypertension: Secondary | ICD-10-CM | POA: Diagnosis not present

## 2019-02-04 DIAGNOSIS — Z48815 Encounter for surgical aftercare following surgery on the digestive system: Secondary | ICD-10-CM | POA: Diagnosis not present

## 2019-02-05 DIAGNOSIS — K5909 Other constipation: Secondary | ICD-10-CM | POA: Diagnosis not present

## 2019-02-05 DIAGNOSIS — Z48815 Encounter for surgical aftercare following surgery on the digestive system: Secondary | ICD-10-CM | POA: Diagnosis not present

## 2019-02-05 DIAGNOSIS — E8881 Metabolic syndrome: Secondary | ICD-10-CM | POA: Diagnosis not present

## 2019-02-05 DIAGNOSIS — G809 Cerebral palsy, unspecified: Secondary | ICD-10-CM | POA: Diagnosis not present

## 2019-02-05 DIAGNOSIS — I10 Essential (primary) hypertension: Secondary | ICD-10-CM | POA: Diagnosis not present

## 2019-02-05 DIAGNOSIS — E119 Type 2 diabetes mellitus without complications: Secondary | ICD-10-CM | POA: Diagnosis not present

## 2019-02-06 DIAGNOSIS — G809 Cerebral palsy, unspecified: Secondary | ICD-10-CM | POA: Diagnosis not present

## 2019-02-06 DIAGNOSIS — K5909 Other constipation: Secondary | ICD-10-CM | POA: Diagnosis not present

## 2019-02-06 DIAGNOSIS — Z48815 Encounter for surgical aftercare following surgery on the digestive system: Secondary | ICD-10-CM | POA: Diagnosis not present

## 2019-02-06 DIAGNOSIS — I10 Essential (primary) hypertension: Secondary | ICD-10-CM | POA: Diagnosis not present

## 2019-02-06 DIAGNOSIS — E119 Type 2 diabetes mellitus without complications: Secondary | ICD-10-CM | POA: Diagnosis not present

## 2019-02-06 DIAGNOSIS — E8881 Metabolic syndrome: Secondary | ICD-10-CM | POA: Diagnosis not present

## 2019-02-08 DIAGNOSIS — I10 Essential (primary) hypertension: Secondary | ICD-10-CM | POA: Diagnosis not present

## 2019-02-08 DIAGNOSIS — Z7984 Long term (current) use of oral hypoglycemic drugs: Secondary | ICD-10-CM | POA: Diagnosis not present

## 2019-02-08 DIAGNOSIS — E8881 Metabolic syndrome: Secondary | ICD-10-CM | POA: Diagnosis not present

## 2019-02-08 DIAGNOSIS — G809 Cerebral palsy, unspecified: Secondary | ICD-10-CM | POA: Diagnosis not present

## 2019-02-08 DIAGNOSIS — Z48815 Encounter for surgical aftercare following surgery on the digestive system: Secondary | ICD-10-CM | POA: Diagnosis not present

## 2019-02-08 DIAGNOSIS — E119 Type 2 diabetes mellitus without complications: Secondary | ICD-10-CM | POA: Diagnosis not present

## 2019-02-08 DIAGNOSIS — K5909 Other constipation: Secondary | ICD-10-CM | POA: Diagnosis not present

## 2019-02-10 ENCOUNTER — Ambulatory Visit (INDEPENDENT_AMBULATORY_CARE_PROVIDER_SITE_OTHER): Payer: Medicare Other | Admitting: Cardiology

## 2019-02-10 ENCOUNTER — Encounter: Payer: Self-pay | Admitting: Cardiology

## 2019-02-10 DIAGNOSIS — R001 Bradycardia, unspecified: Secondary | ICD-10-CM | POA: Diagnosis not present

## 2019-02-10 DIAGNOSIS — I10 Essential (primary) hypertension: Secondary | ICD-10-CM | POA: Diagnosis not present

## 2019-02-10 HISTORY — DX: Bradycardia, unspecified: R00.1

## 2019-02-10 NOTE — Patient Instructions (Signed)
Medication Instructions:  Your physician recommends that you continue on your current medications as directed. Please refer to the Current Medication list given to you today.  If you need a refill on your cardiac medications before your next appointment, please call your pharmacy.   Lab work: None If you have labs (blood work) drawn today and your tests are completely normal, you will receive your results only by: Marland Kitchen MyChart Message (if you have MyChart) OR . A paper copy in the mail If you have any lab test that is abnormal or we need to change your treatment, we will call you to review the results.  Testing/Procedures: Your physician has recommended that you wear a 24 hour holter monitor. Holter monitors are medical devices that record the heart's electrical activity. Doctors most often use these monitors to diagnose arrhythmias. Arrhythmias are problems with the speed or rhythm of the heartbeat. The monitor is a small, portable device. You can wear one while you do your normal daily activities. This is usually used to diagnose what is causing palpitations/syncope (passing out).  Follow-Up: At [facility], you and your health needs are our priority.  As part of our continuing mission to provide you with exceptional heart care, we have created designated Provider Care Teams.  These Care Teams include your primary Cardiologist (physician) and Advanced Practice Providers (APPs -  Physician Assistants and Nurse Practitioners) who all work together to provide you with the care you need, when you need it. You will need a follow up appointment in 6 months.  Please call our office 2 months in advance to schedule this appointment.  You may see Dr. Radford Pax or one of the following Advanced Practice Providers on your designated Care Team:   [place], PA-C Melina Copa, PA-C . Ermalinda Barrios, PA-C

## 2019-02-10 NOTE — Progress Notes (Signed)
Cardiology Office Note    Date:  02/10/2019   ID:  Jay, Bailey [birth year], MRN [id number]  PCP:  Harlan Stains, MD  Cardiologist:  Fransico Him, MD   Chief Complaint  Patient presents with  . New Patient (Initial Visit)    Bradycardia    History of Present Illness:  Jay Bailey is a 78 y.o. male who is being seen today for the evaluation of bradycardia at the request of Harlan Stains, MD.  This is a 78 year old male with a history of depression, type 2 diabetes mellitus, hyperlipidemia and hypertension who was recently seen by his PCP on 02/04/2019.  He had recently had a hernia surgery and had been in rehab.  Apparently the home health PT contacted Dr. Dema Severin twice about his pulse being in the 69s.  He denied any symptoms of chest pain or shortness of breath at that time.  He is now referred for evaluation.  He is not on any rate lowering medications.  He is here today  and is doing well.  He denies any chest pain or pressure, SOB, DOE, PND, orthopnea, LE edema, dizziness, palpitations or syncope. He is compliant with his meds and is tolerating meds with no SE.    Past Medical History:  Diagnosis Date  . 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 Arthritis    fingers and toes   . Bradycardia 02/10/2019  . Cancer ([place])   . Congenital deformity of ankle joint   . Depression    a long time ago   . Diabetes mellitus without complication ([place])    type 2  . Hyperlipidemia   . Hypertension   . Inguinal hernia   . Prostate cancer ([place])   . Skin cancer (melanoma) ([place])    historical   . Wears hearing aid    bialteral     Past Surgical History:  Procedure Laterality Date  . INGUINAL HERNIA REPAIR     right and left hernias  miltiple times with mesh placement   . INGUINAL HERNIA REPAIR Left 12/26/2018   Procedure: OPEN LEFT INGUINAL HERNIA WITH MESH;  Surgeon: Johnathan Hausen, MD;  Location: [facility];  Service: General;  Laterality: Left;  . nose  myeloma    . PROSTATECTOMY  2005  . TOTAL HIP ARTHROPLASTY  "long time ago "   bilateral ; [place]     Current Medications: Current Meds  Medication Sig  . amLODipine (NORVASC) 2.5 MG tablet Take 2.5 mg by mouth daily.  Marland Kitchen amoxicillin (AMOXIL) 500 MG capsule Take 2,000 mg by mouth See admin instructions. For Dental Procedures  . ascorbic acid (VITAMIN C) 1000 MG tablet Take 1,000 mg by mouth daily.  Marland Kitchen aspirin 81 MG tablet Take 81 mg by mouth daily.  Marland Kitchen atorvastatin (LIPITOR) 80 MG tablet Take 80 mg by mouth daily.  . folic acid (FOLVITE) 563 MCG tablet Take 400 mcg by mouth daily.   . Glucosamine-Chondroit-Vit C-Mn (GLUCOSAMINE CHONDR 1500 COMPLX PO) Take 1 tablet by mouth daily.  Marland Kitchen HYDROcodone-acetaminophen (NORCO/VICODIN) 5-325 MG tablet Take 1-2 tablets by mouth every 4 (four) hours as needed for moderate pain.  Marland Kitchen irbesartan (AVAPRO) 300 MG tablet Take 300 mg by mouth daily.  Marland Kitchen linaclotide (LINZESS) 145 MCG CAPS capsule Take 145 mcg by mouth daily as needed (constipation).  . metFORMIN (GLUCOPHAGE) 500 MG tablet Take 500 mg by mouth every evening.   . Multiple Vitamin (MULTIVITAMIN) tablet  Take 1 tablet by mouth daily.  . ondansetron (ZOFRAN-ODT) 4 MG disintegrating tablet Take 1 tablet (4 mg total) by mouth every 6 (six) hours as needed for nausea.  Marland Kitchen oxybutynin (DITROPAN-XL) 5 MG 24 hr tablet TAKE 1 TABLET ONCE DAILY.  Marland Kitchen polyethylene glycol powder (GLYCOLAX/MIRALAX) powder Take 17 g by mouth daily as needed for moderate constipation.   . vitamin E 400 UNIT capsule Take 400 Units by mouth daily.    Allergies:   Patient has no known allergies.   Social History   Socioeconomic History  . Marital status: Single    Spouse name: Not on file  . Number of children: Not on file  . Years of education: Not on file  . Highest education level: Not on file  Occupational History  . Not on file  Social Needs  . Financial resource strain: Not on file  . Food insecurity:    Worry: Not on  file    Inability: Not on file  . Transportation needs:    Medical: Not on file    Non-medical: Not on file  Tobacco Use  . Smoking status: Never Smoker  . Smokeless tobacco: Never Used  Substance and Sexual Activity  . Alcohol use: No  . Drug use: No  . Sexual activity: Not on file  Lifestyle  . Physical activity:    Days per week: Not on file    Minutes per session: Not on file  . Stress: Not on file  Relationships  . Social connections:    Talks on phone: Not on file    Gets together: Not on file    Attends religious service: Not on file    Active member of club or organization: Not on file    Attends meetings of clubs or organizations: Not on file    Relationship status: Not on file  Other Topics Concern  . Not on file  Social History Narrative  . Not on file     Family History:  The patient's family history includes Colon cancer in his mother; Heart disease in his father; Lung cancer in his brother.   ROS:   Please see the history of present illness.    ROS All other systems reviewed and are negative.  No flowsheet data found.     PHYSICAL EXAM:   VS:  BP 140/77   Pulse (!) 56   Ht 5\' 8"  (1.727 m)   Wt 189 lb 3.2 oz (85.8 kg)   SpO2 98%   BMI 28.77 kg/m    GEN: Well nourished, well developed, in no acute distress  HEENT: normal  Neck: no JVD, carotid bruits, or masses Cardiac: RRR; no murmurs, rubs, or gallops,no edema.  Intact distal pulses bilaterally.  Respiratory:  clear to auscultation bilaterally, normal work of breathing GI: soft, nontender, nondistended, + BS MS: no deformity or atrophy  Skin: warm and dry, no rash Neuro:  Alert and Oriented x 3, Strength and sensation are intact Psych: euthymic mood, full affect  Wt Readings from Last 3 Encounters:  02/10/19 189 lb 3.2 oz (85.8 kg)  12/29/18 185 lb 13.6 oz (84.3 kg)  01/10/17 193 lb (87.5 kg)      Studies/Labs Reviewed:   EKG:  EKG is not ordered today.    Recent Labs: 12/19/2018:  BUN 20; Potassium 4.5; Sodium 138 12/26/2018: Creatinine, Ser 1.03 12/27/2018: Hemoglobin 12.5; Platelets 179   Lipid Panel No results found for: CHOL, TRIG, HDL, CHOLHDL, VLDL, LDLCALC, LDLDIRECT  Additional  studies/ records that were reviewed today include:  Office notes from PCP    ASSESSMENT:    1. Bradycardia   2. Essential hypertension      PLAN:  In order of problems listed above:  1.  Bradycardia - he is recently been in PT at home after undergoing hernia surgery and the PT called his PCP to let them know his heart rate was running in the low 40s.  Patient has been completely asymptomatic.  He is not on any rate lowering drugs.  On review of prior office notes from PCP heart rate is been in the 50s.  His TSH was normal at 1.58 earlier this year.  EKG done 12/19/2018 showed sinus bradycardia 49 bpm.  At this point no indication for permanent pacemaker as he is completely asymptomatic and heart rates are above 30 bpm.  I recommended getting a 24-hour Holter monitor to assess average heart rate and see what the heart rate ranges during the day. He will see me back in 6 months.  2.  Hypertension - BP is controlled on exam today.  He will continue on amlodipine 2.5 mg daily and Avapro 300 mg daily.    Medication Adjustments/Labs and Tests Ordered: Current medicines are reviewed at length with the patient today.  Concerns regarding medicines are outlined above.  Medication changes, Labs and Tests ordered today are listed in the Patient Instructions below.  Patient Instructions  Medication Instructions:  Your physician recommends that you continue on your current medications as directed. Please refer to the Current Medication list given to you today.  If you need a refill on your cardiac medications before your next appointment, please call your pharmacy.   Lab work: None If you have labs (blood work) drawn today and your tests are completely normal, you will receive your results  only by: Marland Kitchen MyChart Message (if you have MyChart) OR . A paper copy in the mail If you have any lab test that is abnormal or we need to change your treatment, we will call you to review the results.  Testing/Procedures: Your physician has recommended that you wear a 24 hour holter monitor. Holter monitors are medical devices that record the heart's electrical activity. Doctors most often use these monitors to diagnose arrhythmias. Arrhythmias are problems with the speed or rhythm of the heartbeat. The monitor is a small, portable device. You can wear one while you do your normal daily activities. This is usually used to diagnose what is causing palpitations/syncope (passing out).  Follow-Up: At [facility], you and your health needs are our priority.  As part of our continuing mission to provide you with exceptional heart care, we have created designated Provider Care Teams.  These Care Teams include your primary Cardiologist (physician) and Advanced Practice Providers (APPs -  Physician Assistants and Nurse Practitioners) who all work together to provide you with the care you need, when you need it. You will need a follow up appointment in 6 months.  Please call our office 2 months in advance to schedule this appointment.  You may see Dr. Radford Pax or one of the following Advanced Practice Providers on your designated Care Team:   [place], PA-C Melina Copa, PA-C . Ermalinda Barrios, PA-C      Signed, Fransico Him, MD  02/10/2019 2:53 PM    [address] [place], [place], [place]  [address] Phone: [phone]; Fax: [phone]

## 2019-02-11 ENCOUNTER — Ambulatory Visit: Payer: Medicare Other | Admitting: Podiatry

## 2019-02-17 ENCOUNTER — Ambulatory Visit: Payer: Medicare Other | Admitting: Podiatry

## 2019-02-20 DIAGNOSIS — I10 Essential (primary) hypertension: Secondary | ICD-10-CM | POA: Diagnosis not present

## 2019-02-20 DIAGNOSIS — K5909 Other constipation: Secondary | ICD-10-CM | POA: Diagnosis not present

## 2019-02-20 DIAGNOSIS — Z48815 Encounter for surgical aftercare following surgery on the digestive system: Secondary | ICD-10-CM | POA: Diagnosis not present

## 2019-02-20 DIAGNOSIS — E119 Type 2 diabetes mellitus without complications: Secondary | ICD-10-CM | POA: Diagnosis not present

## 2019-02-20 DIAGNOSIS — G809 Cerebral palsy, unspecified: Secondary | ICD-10-CM | POA: Diagnosis not present

## 2019-02-20 DIAGNOSIS — E8881 Metabolic syndrome: Secondary | ICD-10-CM | POA: Diagnosis not present

## 2019-03-18 ENCOUNTER — Ambulatory Visit: Payer: Medicare Other | Admitting: Podiatry

## 2019-04-21 ENCOUNTER — Ambulatory Visit: Payer: Medicare Other | Admitting: Podiatry

## 2019-05-13 ENCOUNTER — Encounter

## 2019-05-13 ENCOUNTER — Other Ambulatory Visit: Payer: Self-pay

## 2019-05-13 ENCOUNTER — Encounter: Payer: Self-pay | Admitting: Podiatry

## 2019-05-13 ENCOUNTER — Ambulatory Visit (INDEPENDENT_AMBULATORY_CARE_PROVIDER_SITE_OTHER): Payer: Medicare Other | Admitting: Podiatry

## 2019-05-13 VITALS — Temp 98.7°F

## 2019-05-13 DIAGNOSIS — B351 Tinea unguium: Secondary | ICD-10-CM | POA: Diagnosis not present

## 2019-05-13 DIAGNOSIS — M79676 Pain in unspecified toe(s): Secondary | ICD-10-CM

## 2019-05-13 DIAGNOSIS — E1149 Type 2 diabetes mellitus with other diabetic neurological complication: Secondary | ICD-10-CM

## 2019-05-13 NOTE — Progress Notes (Signed)
Patient ID: Jay Bailey, male   DOB: [birth year], 78 y.o.   MRN: [id number] Complaint:  Visit Type: Patient returns to my office for continued preventative foot care services. Complaint: Patient states" my nails have grown long and thick and become painful to walk and wear shoes" Patient has been diagnosed with DM with neuropathy.Marland Kitchen He presents for preventative foot care services. No changes to ROS  Podiatric Exam: Vascular: dorsalis pedis and posterior tibial pulses are palpable bilateral. Capillary return is immediate. Temperature gradient is WNL. Skin turgor WNL  Sensorium: Normal Semmes Weinstein monofilament test. Normal tactile sensation bilaterally. Nail Exam: Pt has thick disfigured discolored nails with subungual debris noted bilateral entire nail hallux through fifth toenails Ulcer Exam: There is no evidence of ulcer or pre-ulcerative changes or infection. Orthopedic Exam: Muscle tone and strength are WNL. No limitations in general ROM. No crepitus or effusions noted. Foot type and digits show no abnormalities. 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 Skin: No Porokeratosis. No infection or ulcers  Diagnosis:  Tinea unguium, Pain in right toe, pain in left toes  Treatment & Plan Procedures and Treatment: Consent by patient was obtained for treatment procedures. The patient understood the discussion of treatment and procedures well. All questions were answered thoroughly reviewed. Debridement of mycotic and hypertrophic toenails, 1 through 5 bilateral and clearing of subungual debris. No ulceration, no infection noted.  Return Visit-Office Procedure: Patient instructed to return to the office for a follow up visit 10 weeks  for continued evaluation and treatment.   Gardiner Barefoot DPM

## 2019-05-19 ENCOUNTER — Telehealth: Payer: Self-pay | Admitting: Cardiology

## 2019-05-19 NOTE — Telephone Encounter (Signed)
Called patient and offered him to come in and have his monitor applied tom. He denied at this time and prefers to wait a few more weeks.

## 2019-05-19 NOTE — Telephone Encounter (Signed)
  Patient called back and I cancelled his holter monitor appt but he wants to reschedule because he says he wants help to put it on. Please return his call.

## 2019-05-20 ENCOUNTER — Ambulatory Visit: Payer: Medicare Other | Admitting: Podiatry

## 2019-05-25 DIAGNOSIS — N3946 Mixed incontinence: Secondary | ICD-10-CM | POA: Diagnosis not present

## 2019-05-25 DIAGNOSIS — R9721 Rising PSA following treatment for malignant neoplasm of prostate: Secondary | ICD-10-CM | POA: Diagnosis not present

## 2019-06-16 DIAGNOSIS — Z85820 Personal history of malignant melanoma of skin: Secondary | ICD-10-CM | POA: Diagnosis not present

## 2019-06-16 DIAGNOSIS — F325 Major depressive disorder, single episode, in full remission: Secondary | ICD-10-CM | POA: Diagnosis not present

## 2019-06-16 DIAGNOSIS — C61 Malignant neoplasm of prostate: Secondary | ICD-10-CM | POA: Diagnosis not present

## 2019-06-16 DIAGNOSIS — E785 Hyperlipidemia, unspecified: Secondary | ICD-10-CM | POA: Diagnosis not present

## 2019-06-16 DIAGNOSIS — I10 Essential (primary) hypertension: Secondary | ICD-10-CM | POA: Diagnosis not present

## 2019-06-16 DIAGNOSIS — E1151 Type 2 diabetes mellitus with diabetic peripheral angiopathy without gangrene: Secondary | ICD-10-CM | POA: Diagnosis not present

## 2019-06-16 DIAGNOSIS — E1140 Type 2 diabetes mellitus with diabetic neuropathy, unspecified: Secondary | ICD-10-CM | POA: Diagnosis not present

## 2019-06-16 DIAGNOSIS — Z0000 Encounter for general adult medical examination without abnormal findings: Secondary | ICD-10-CM | POA: Diagnosis not present

## 2019-06-16 DIAGNOSIS — D696 Thrombocytopenia, unspecified: Secondary | ICD-10-CM | POA: Diagnosis not present

## 2019-06-25 DIAGNOSIS — D225 Melanocytic nevi of trunk: Secondary | ICD-10-CM | POA: Diagnosis not present

## 2019-06-25 DIAGNOSIS — Z85820 Personal history of malignant melanoma of skin: Secondary | ICD-10-CM | POA: Diagnosis not present

## 2019-06-25 DIAGNOSIS — L821 Other seborrheic keratosis: Secondary | ICD-10-CM | POA: Diagnosis not present

## 2019-06-25 DIAGNOSIS — D2272 Melanocytic nevi of left lower limb, including hip: Secondary | ICD-10-CM | POA: Diagnosis not present

## 2019-06-25 DIAGNOSIS — C8409 Mycosis fungoides, extranodal and solid organ sites: Secondary | ICD-10-CM | POA: Diagnosis not present

## 2019-08-19 ENCOUNTER — Ambulatory Visit (INDEPENDENT_AMBULATORY_CARE_PROVIDER_SITE_OTHER): Payer: Medicare Other | Admitting: Podiatry

## 2019-08-19 ENCOUNTER — Other Ambulatory Visit: Payer: Self-pay

## 2019-08-19 ENCOUNTER — Encounter: Payer: Self-pay | Admitting: Podiatry

## 2019-08-19 DIAGNOSIS — B351 Tinea unguium: Secondary | ICD-10-CM

## 2019-08-19 DIAGNOSIS — M79676 Pain in unspecified toe(s): Secondary | ICD-10-CM | POA: Diagnosis not present

## 2019-08-19 NOTE — Progress Notes (Signed)
Patient ID: COSIMO DOCTOR, male   DOB: 1941-05-16, 78 y.o.   MRN: [id number] Complaint:  Visit Type: Patient returns to my office for continued preventative foot care services. Complaint: Patient states" my nails have grown long and thick and become painful to walk and wear shoes" Patient has been diagnosed with DM with neuropathy.Marland Kitchen He presents for preventative foot care services. No changes to ROS  Podiatric Exam: Vascular: dorsalis pedis and posterior tibial pulses are palpable bilateral. Capillary return is immediate. Temperature gradient is WNL. Skin turgor WNL  Sensorium: Normal Semmes Weinstein monofilament test. Normal tactile sensation bilaterally. Nail Exam: Pt has thick disfigured discolored nails with subungual debris noted bilateral entire nail hallux through fifth toenails Ulcer Exam: There is no evidence of ulcer or pre-ulcerative changes or infection. Orthopedic Exam: Muscle tone and strength are WNL. No limitations in general ROM. No crepitus or effusions noted. Foot type and digits show no abnormalities. 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 Skin: No Porokeratosis. No infection or ulcers  Diagnosis:  Tinea unguium, Pain in right toe, pain in left toes  Treatment & Plan Procedures and Treatment: Consent by patient was obtained for treatment procedures. The patient understood the discussion of treatment and procedures well. All questions were answered thoroughly reviewed. Debridement of mycotic and hypertrophic toenails, 1 through 5 bilateral and clearing of subungual debris. No ulceration, no infection noted.  Return Visit-Office Procedure: Patient instructed to return to the office for a follow up visit 10 weeks  for continued evaluation and treatment.   Gardiner Barefoot DPM

## 2019-08-27 DIAGNOSIS — Z23 Encounter for immunization: Secondary | ICD-10-CM | POA: Diagnosis not present

## 2019-09-29 ENCOUNTER — Other Ambulatory Visit: Payer: Self-pay

## 2019-09-29 DIAGNOSIS — Z20822 Contact with and (suspected) exposure to covid-19: Secondary | ICD-10-CM

## 2019-10-01 LAB — NOVEL CORONAVIRUS, NAA: SARS-CoV-2, NAA: NOT DETECTED

## 2019-10-09 DIAGNOSIS — E119 Type 2 diabetes mellitus without complications: Secondary | ICD-10-CM | POA: Diagnosis not present

## 2019-10-09 DIAGNOSIS — H5203 Hypermetropia, bilateral: Secondary | ICD-10-CM | POA: Diagnosis not present

## 2019-11-17 ENCOUNTER — Encounter: Payer: Self-pay | Admitting: Podiatry

## 2019-11-17 ENCOUNTER — Ambulatory Visit (INDEPENDENT_AMBULATORY_CARE_PROVIDER_SITE_OTHER): Payer: Medicare Other | Admitting: Podiatry

## 2019-11-17 ENCOUNTER — Other Ambulatory Visit: Payer: Self-pay

## 2019-11-17 DIAGNOSIS — B351 Tinea unguium: Secondary | ICD-10-CM

## 2019-11-17 DIAGNOSIS — M79676 Pain in unspecified toe(s): Secondary | ICD-10-CM | POA: Diagnosis not present

## 2019-11-17 DIAGNOSIS — E1149 Type 2 diabetes mellitus with other diabetic neurological complication: Secondary | ICD-10-CM

## 2019-11-17 NOTE — Progress Notes (Signed)
Patient ID: Jay Bailey, male   DOB: 1941/11/02, 78 y.o.   MRN: [id number] Complaint:  Visit Type: Patient returns to my office for continued preventative foot care services. Complaint: Patient states" my nails have grown long and thick and become painful to walk and wear shoes" Patient has been diagnosed with DM with neuropathy.Marland Kitchen He presents for preventative foot care services. No changes to ROS  Podiatric Exam: Vascular: dorsalis pedis and posterior tibial pulses are palpable bilateral. Capillary return is immediate. Temperature gradient is WNL. Skin turgor WNL  Sensorium: Normal Semmes Weinstein monofilament test. Normal tactile sensation bilaterally. Nail Exam: Pt has thick disfigured discolored nails with subungual debris noted bilateral entire nail hallux through fifth toenails Ulcer Exam: There is no evidence of ulcer or pre-ulcerative changes or infection. Orthopedic Exam: Muscle tone and strength are WNL. No limitations in general ROM. No crepitus or effusions noted. Foot type and digits show no abnormalities. 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 Prominent metatarsal heads  B/L. Skin: No Porokeratosis. No infection or ulcers  Diagnosis:  Tinea unguium, Pain in right toe, pain in left toes  Treatment & Plan Procedures and Treatment: Consent by patient was obtained for treatment procedures. The patient understood the discussion of treatment and procedures well. All questions were answered thoroughly reviewed. Debridement of mycotic and hypertrophic toenails, 1 through 5 bilateral and clearing of subungual debris. No ulceration, no infection noted. Evaluated by Liliane Channel for prominent metatarsal heads. Return Visit-Office Procedure: Patient instructed to return to the office for a follow up visit 10 weeks  for continued evaluation and treatment.   Gardiner Barefoot DPM

## 2019-11-18 ENCOUNTER — Ambulatory Visit: Payer: Medicare Other | Admitting: Podiatry

## 2019-11-23 ENCOUNTER — Other Ambulatory Visit: Payer: Medicare Other | Admitting: Orthotics

## 2019-12-10 DIAGNOSIS — C61 Malignant neoplasm of prostate: Secondary | ICD-10-CM | POA: Diagnosis not present

## 2019-12-10 DIAGNOSIS — E1151 Type 2 diabetes mellitus with diabetic peripheral angiopathy without gangrene: Secondary | ICD-10-CM | POA: Diagnosis not present

## 2019-12-10 DIAGNOSIS — I739 Peripheral vascular disease, unspecified: Secondary | ICD-10-CM | POA: Diagnosis not present

## 2019-12-10 DIAGNOSIS — B353 Tinea pedis: Secondary | ICD-10-CM | POA: Diagnosis not present

## 2019-12-10 DIAGNOSIS — E785 Hyperlipidemia, unspecified: Secondary | ICD-10-CM | POA: Diagnosis not present

## 2019-12-10 DIAGNOSIS — E1140 Type 2 diabetes mellitus with diabetic neuropathy, unspecified: Secondary | ICD-10-CM | POA: Diagnosis not present

## 2019-12-10 DIAGNOSIS — I10 Essential (primary) hypertension: Secondary | ICD-10-CM | POA: Diagnosis not present

## 2019-12-14 ENCOUNTER — Ambulatory Visit: Payer: Medicare Other | Attending: Internal Medicine

## 2019-12-14 DIAGNOSIS — Z23 Encounter for immunization: Secondary | ICD-10-CM

## 2019-12-14 NOTE — Progress Notes (Signed)
   Covid-19 Vaccination Clinic  Name:  Jay Bailey    MRN: [id number] DOB: [birth year]  12/14/2019  [name] was observed post Covid-19 immunization for 30 minutes based on pre-vaccination screening without incidence. He was provided with Vaccine Information Sheet and instruction to access the V-Safe system.   [name] was instructed to call 911 with any severe reactions post vaccine: Marland Kitchen Difficulty breathing  . Swelling of your face and throat  . A fast heartbeat  . A bad rash all over your body  . Dizziness and weakness

## 2019-12-31 DIAGNOSIS — D225 Melanocytic nevi of trunk: Secondary | ICD-10-CM | POA: Diagnosis not present

## 2019-12-31 DIAGNOSIS — L821 Other seborrheic keratosis: Secondary | ICD-10-CM | POA: Diagnosis not present

## 2019-12-31 DIAGNOSIS — L578 Other skin changes due to chronic exposure to nonionizing radiation: Secondary | ICD-10-CM | POA: Diagnosis not present

## 2020-01-01 ENCOUNTER — Ambulatory Visit: Payer: Medicare Other

## 2020-01-02 ENCOUNTER — Ambulatory Visit: Payer: Medicare Other | Attending: Internal Medicine

## 2020-01-02 DIAGNOSIS — Z23 Encounter for immunization: Secondary | ICD-10-CM | POA: Insufficient documentation

## 2020-01-02 NOTE — Progress Notes (Signed)
   Covid-19 Vaccination Clinic  Name:  Jay Bailey    MRN: [id number] DOB: [birth year]  01/02/2020  [name] was observed post Covid-19 immunization for 15 minutes without incidence. He was provided with Vaccine Information Sheet and instruction to access the V-Safe system.   [name] was instructed to call 911 with any severe reactions post vaccine: Marland Kitchen Difficulty breathing  . Swelling of your face and throat  . A fast heartbeat  . A bad rash all over your body  . Dizziness and weakness    Immunizations Administered    Name Date Dose VIS Date Route   Pfizer COVID-19 Vaccine 01/02/2020  1:08 PM 0.3 mL 11/13/2019 Intramuscular   Manufacturer: [place]   Lot: [id number]   [place]: [id number]

## 2020-01-26 ENCOUNTER — Other Ambulatory Visit: Payer: Self-pay

## 2020-01-26 ENCOUNTER — Ambulatory Visit (INDEPENDENT_AMBULATORY_CARE_PROVIDER_SITE_OTHER): Payer: Medicare Other | Admitting: Podiatry

## 2020-01-26 VITALS — Temp 97.7°F

## 2020-01-26 DIAGNOSIS — B351 Tinea unguium: Secondary | ICD-10-CM

## 2020-01-26 DIAGNOSIS — M79676 Pain in unspecified toe(s): Secondary | ICD-10-CM | POA: Diagnosis not present

## 2020-01-26 DIAGNOSIS — E1149 Type 2 diabetes mellitus with other diabetic neurological complication: Secondary | ICD-10-CM

## 2020-01-26 NOTE — Progress Notes (Addendum)
Patient ID: Jay Bailey, male   DOB: [birth year], 79 y.o.   MRN: [id number] Complaint:  Visit Type: Patient returns to my office for continued preventative foot care services. Complaint: Patient states" my nails have grown long and thick and become painful to walk and wear shoes" Patient has been diagnosed with DM with neuropathy.Marland Kitchen He presents for preventative foot care services. No changes to ROS  Podiatric Exam: Vascular: dorsalis pedis and posterior tibial pulses are palpable bilateral. Capillary return is immediate. Temperature gradient is WNL. Skin turgor WNL  Sensorium: Normal Semmes Weinstein monofilament test. Normal tactile sensation bilaterally. Nail Exam: Pt has thick disfigured discolored nails with subungual debris noted bilateral entire nail hallux through fifth toenails Ulcer Exam: There is no evidence of ulcer or pre-ulcerative changes or infection. Orthopedic Exam: Muscle tone and strength are WNL. No limitations in general ROM. No crepitus or effusions noted. Foot type and digits show no abnormalities. 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 Prominent metatarsal heads  B/L. Skin: No Porokeratosis. No infection or ulcers  Diagnosis:  Tinea unguium, Pain in right toe, pain in left toes  Treatment & Plan Procedures and Treatment: Consent by patient was obtained for treatment procedures. The patient understood the discussion of treatment and procedures well. All questions were answered thoroughly reviewed. Debridement of mycotic and hypertrophic toenails, 1 through 5 bilateral and clearing of subungual debris. No ulceration, no infection noted.  Visual inspection of diabetic foot was performed.  Return Visit-Office Procedure: Patient instructed to return to the office for a follow up visit 10 weeks  for continued evaluation and treatment.   Gardiner Barefoot DPM

## 2020-02-27 ENCOUNTER — Encounter (HOSPITAL_COMMUNITY): Payer: Self-pay

## 2020-02-27 ENCOUNTER — Other Ambulatory Visit: Payer: Self-pay

## 2020-02-27 ENCOUNTER — Emergency Department (HOSPITAL_COMMUNITY)
Admission: EM | Admit: 2020-02-27 | Discharge: 2020-02-28 | Disposition: A | Discharge status: Home / Self Care | Payer: Medicare Other | Attending: Emergency Medicine | Admitting: Emergency Medicine

## 2020-02-27 DIAGNOSIS — Z8546 Personal history of malignant neoplasm of prostate: Secondary | ICD-10-CM | POA: Diagnosis not present

## 2020-02-27 DIAGNOSIS — Z96643 Presence of artificial hip joint, bilateral: Secondary | ICD-10-CM | POA: Diagnosis not present

## 2020-02-27 DIAGNOSIS — E119 Type 2 diabetes mellitus without complications: Secondary | ICD-10-CM | POA: Insufficient documentation

## 2020-02-27 DIAGNOSIS — R1030 Lower abdominal pain, unspecified: Secondary | ICD-10-CM | POA: Diagnosis not present

## 2020-02-27 DIAGNOSIS — Z7982 Long term (current) use of aspirin: Secondary | ICD-10-CM | POA: Insufficient documentation

## 2020-02-27 DIAGNOSIS — Z85828 Personal history of other malignant neoplasm of skin: Secondary | ICD-10-CM | POA: Insufficient documentation

## 2020-02-27 DIAGNOSIS — Z79899 Other long term (current) drug therapy: Secondary | ICD-10-CM | POA: Diagnosis not present

## 2020-02-27 DIAGNOSIS — Z7984 Long term (current) use of oral hypoglycemic drugs: Secondary | ICD-10-CM | POA: Diagnosis not present

## 2020-02-27 DIAGNOSIS — I10 Essential (primary) hypertension: Secondary | ICD-10-CM | POA: Diagnosis not present

## 2020-02-27 DIAGNOSIS — T63461A Toxic effect of venom of wasps, accidental (unintentional), initial encounter: Secondary | ICD-10-CM | POA: Diagnosis not present

## 2020-02-27 NOTE — ED Triage Notes (Signed)
Pt reports that he was stung by a wasp on his R hand around 4p.

## 2020-02-28 DIAGNOSIS — T63461A Toxic effect of venom of wasps, accidental (unintentional), initial encounter: Secondary | ICD-10-CM | POA: Diagnosis not present

## 2020-02-28 DIAGNOSIS — R1030 Lower abdominal pain, unspecified: Secondary | ICD-10-CM | POA: Diagnosis not present

## 2020-02-28 LAB — COMPREHENSIVE METABOLIC PANEL
ALT: 36 U/L (ref 0–44)
AST: 24 U/L (ref 15–41)
Albumin: 4.4 g/dL (ref 3.5–5.0)
Alkaline Phosphatase: 111 U/L (ref 38–126)
Anion gap: 10 (ref 5–15)
BUN: 28 mg/dL — ABNORMAL HIGH (ref 8–23)
CO2: 25 mmol/L (ref 22–32)
Calcium: 9.7 mg/dL (ref 8.9–10.3)
Chloride: 106 mmol/L (ref 98–111)
Creatinine, Ser: 1.59 mg/dL — ABNORMAL HIGH (ref 0.61–1.24)
GFR calc Af Amer: 48 mL/min — ABNORMAL LOW (ref >60)
GFR calc non Af Amer: 42 mL/min — ABNORMAL LOW (ref >60)
Glucose, Bld: 143 mg/dL — ABNORMAL HIGH (ref 70–99)
Potassium: 4.2 mmol/L (ref 3.5–5.1)
Sodium: 141 mmol/L (ref 135–145)
Total Bilirubin: 0.7 mg/dL (ref 0.3–1.2)
Total Protein: 7.2 g/dL (ref 6.5–8.1)

## 2020-02-28 LAB — CBC WITH DIFFERENTIAL/PLATELET
Abs Immature Granulocytes: 0.02 10*3/uL (ref 0.00–0.07)
Basophils Absolute: 0 10*3/uL (ref 0.0–0.1)
Basophils Relative: 0 %
Eosinophils Absolute: 0.1 10*3/uL (ref 0.0–0.5)
Eosinophils Relative: 1 %
HCT: 46.4 % (ref 39.0–52.0)
Hemoglobin: 15.4 g/dL (ref 13.0–17.0)
Immature Granulocytes: 0 %
Lymphocytes Relative: 8 %
Lymphs Abs: 0.8 10*3/uL (ref 0.7–4.0)
MCH: 32.7 pg (ref 26.0–34.0)
MCHC: 33.2 g/dL (ref 30.0–36.0)
MCV: 98.5 fL (ref 80.0–100.0)
Monocytes Absolute: 1.2 10*3/uL — ABNORMAL HIGH (ref 0.1–1.0)
Monocytes Relative: 11 %
Neutro Abs: 8.5 10*3/uL — ABNORMAL HIGH (ref 1.7–7.7)
Neutrophils Relative %: 80 %
Platelets: 152 10*3/uL (ref 150–400)
RBC: 4.71 MIL/uL (ref 4.22–5.81)
RDW: 13 % (ref 11.5–15.5)
WBC: 10.5 10*3/uL (ref 4.0–10.5)
nRBC: 0 % (ref 0.0–0.2)

## 2020-02-28 LAB — URINALYSIS, ROUTINE W REFLEX MICROSCOPIC
Bacteria, UA: NONE SEEN
Bilirubin Urine: NEGATIVE
Glucose, UA: NEGATIVE mg/dL
Ketones, ur: NEGATIVE mg/dL
Leukocytes,Ua: NEGATIVE
Nitrite: NEGATIVE
Protein, ur: 30 mg/dL — AB
RBC / HPF: >50 RBC/hpf — ABNORMAL HIGH (ref 0–5)
Specific Gravity, Urine: 1.016 (ref 1.005–1.030)
pH: 5 (ref 5.0–8.0)

## 2020-02-28 NOTE — ED Notes (Signed)
Pt lying in bed. NAD noted. Pt denies any needs and is asking when he can go home.

## 2020-02-28 NOTE — Discharge Instructions (Signed)
Take benadryl for sting-- I would try to reserve this for night time use as it can make you drowsy. Follow-up closely with your primary care doctor. Return here for any new/acute changes.

## 2020-02-28 NOTE — ED Provider Notes (Signed)
[facility] DEPT Provider Note   CSN: [id number] Arrival date & time: 02/27/20  2315     History Chief Complaint  Patient presents with  . Insect Bite    Jay Bailey is a 79 y.o. male.  The history is provided by the patient and medical records.    79 y.o. M with hx of arthritis, hx of prostate cancer s/p resection, depression, DM, HLP, HTN, presenting to the ED with multiple complaints.  1.  Right hand pain-- states he was trying to get the door to his porch open today and was stung by either a wasp or a bee around 4pm.  States swelling has progressed since then.  He did get some OTC benadryl but has not taken any yet.  Denies fever/chills.  No known insect allergies.  2.  Left lower abdominal pain-- states this began about 1 hour PTA, worse with changing position.  States he feels something "weird" on the left side of his abdomen.  He denies any nausea, vomiting, diarrhea, fever, chills, sweats, or urinary symptoms.  Has hx prior hernia repair and prostatectomy.  Normal BM's, eating and drinking well.  Past Medical History:  Diagnosis Date  . 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 Arthritis    fingers and toes   . Bradycardia 02/10/2019  . Cancer ([place])   . Congenital deformity of ankle joint   . Depression    a long time ago   . Diabetes mellitus without complication ([place])    type 2  . Hyperlipidemia   . Hypertension   . Inguinal hernia   . Prostate cancer ([place])   . Skin cancer (melanoma) ([place])    historical   . Wears hearing aid    bialteral     Patient Active Problem List   Diagnosis Date Noted  . Bradycardia 02/10/2019  . Essential hypertension 02/10/2019  . CP (cerebral palsy) ([place]) 12/29/2018  . Adult failure to thrive 12/27/2018  . Recurrent left inguinal hernia 12/26/2018    Past Surgical History:  Procedure Laterality Date  . INGUINAL HERNIA REPAIR     right and left hernias  miltiple times  with mesh placement   . INGUINAL HERNIA REPAIR Left 12/26/2018   Procedure: OPEN LEFT INGUINAL HERNIA WITH MESH;  Surgeon: Johnathan Hausen, MD;  Location: [facility];  Service: General;  Laterality: Left;  . nose myeloma    . PROSTATECTOMY  2005  . TOTAL HIP ARTHROPLASTY  "long time ago "   bilateral ; Al[facility]io        Family History  Problem Relation Age of Onset  . Colon cancer Mother   . Heart disease Father   . Lung cancer Brother     Social History   Tobacco Use  . Smoking status: Never Smoker  . Smokeless tobacco: Never Used  Substance Use Topics  . Alcohol use: No  . Drug use: No    Home Medications Prior to Admission medications   Medication Sig Start Date End Date Taking? Authorizing Provider  amLODipine (NORVASC) 2.5 MG tablet Take 2.5 mg by mouth daily.    [provider]  amoxicillin (AMOXIL) 500 MG capsule Take 2,000 mg by mouth See admin instructions. For Dental Procedures    [provider]  ascorbic acid (VITAMIN C) 1000 MG tablet Take 1,000 mg by mouth daily.    [provider]  aspirin 81 MG tablet Take 81 mg  by mouth daily.    [provider]  atorvastatin (LIPITOR) 80 MG tablet Take 80 mg by mouth daily.    [provider]  FLUAD 0.5 ML SUSY TO BE ADMINISTERED BY PHARMACIST FOR IMMUNIZATION 12/09/18   [provider]  folic acid (FOLVITE) A999333 MCG tablet Take 400 mcg by mouth daily.     [provider]  Glucosamine-Chondroit-Vit C-Mn (GLUCOSAMINE CHONDR 1500 COMPLX PO) Take 1 tablet by mouth daily.    [provider]  HYDROcodone-acetaminophen (NORCO/VICODIN) 5-325 MG tablet Take 1-2 tablets by mouth every 4 (four) hours as needed for moderate pain. 12/30/18   Johnathan Hausen, MD  irbesartan (AVAPRO) 300 MG tablet Take 300 mg by mouth daily.    [provider]  linaclotide (LINZESS) 145 MCG CAPS capsule Take 145 mcg by mouth daily as needed (constipation).    [provider]    metFORMIN (GLUCOPHAGE) 500 MG tablet Take 500 mg by mouth every evening.     [provider]  metFORMIN (GLUCOPHAGE-XR) 500 MG 24 hr tablet  11/27/18   [provider]  Multiple Vitamin (MULTIVITAMIN) tablet Take 1 tablet by mouth daily.    [provider]  ondansetron (ZOFRAN-ODT) 4 MG disintegrating tablet Take 1 tablet (4 mg total) by mouth every 6 (six) hours as needed for nausea. 12/30/18   Johnathan Hausen, MD  oxybutynin (DITROPAN-XL) 5 MG 24 hr tablet TAKE 1 TABLET ONCE DAILY. 05/23/18   [provider]  polyethylene glycol powder (GLYCOLAX/MIRALAX) powder Take 17 g by mouth daily as needed for moderate constipation.  02/01/14   [provider]  vitamin E 400 UNIT capsule Take 400 Units by mouth daily.    [provider]    Allergies    Patient has no known allergies.  Review of Systems   Review of Systems  Gastrointestinal: Positive for abdominal pain.  Musculoskeletal: Positive for joint swelling.  All other systems reviewed and are negative.   Physical Exam Updated Vital Signs BP (!) 174/80 (BP Location: Left Arm)   Pulse 69   Temp 97.7 F (36.5 C) (Oral)   Resp 18   Ht 5\' 8"  (1.727 m)   Wt 85.7 kg   SpO2 100%   BMI 28.74 kg/m   Physical Exam Vitals and nursing note reviewed.  Constitutional:      Appearance: He is well-developed.  HENT:     Head: Normocephalic and atraumatic.  Eyes:     Conjunctiva/sclera: Conjunctivae normal.     Pupils: Pupils are equal, round, and reactive to light.  Cardiovascular:     Rate and Rhythm: Normal rate and regular rhythm.     Heart sounds: Normal heart sounds.  Pulmonary:     Effort: Pulmonary effort is normal.     Breath sounds: Normal breath sounds.  Abdominal:     General: Bowel sounds are normal.     Palpations: Abdomen is soft.     Tenderness: There is no abdominal tenderness. There is no guarding or rebound.     Comments: Well healing midline surgical incision, does  have some varicose veins along left side of abdomen without any noted tenderness or peritoneal signs, normal bowel sounds  Musculoskeletal:        General: Normal range of motion.     Cervical back: Normal range of motion.     Comments: Right hand with mild swelling and erythema along dorsal hand and into middle finger; normal ROM, normal distal sensation and cap refill, compartments  soft and easily compressible  Skin:    General: Skin is warm and dry.  Neurological:     Mental Status: He is alert and oriented to person, place, and time.     ED Results / Procedures / Treatments   Labs (all labs ordered are listed, but only abnormal results are displayed) Labs Reviewed  CBC WITH DIFFERENTIAL/PLATELET - Abnormal; Notable for the following components:      Result Value   Neutro Abs 8.5 (*)    Monocytes Absolute 1.2 (*)    All other components within normal limits  COMPREHENSIVE METABOLIC PANEL - Abnormal; Notable for the following components:   Glucose, Bld 143 (*)    BUN 28 (*)    Creatinine, Ser 1.59 (*)    GFR calc non Af Amer 42 (*)    GFR calc Af Amer 48 (*)    All other components within normal limits  URINALYSIS, ROUTINE W REFLEX MICROSCOPIC - Abnormal; Notable for the following components:   Hgb urine dipstick LARGE (*)    Protein, ur 30 (*)    RBC / HPF >50 (*)    All other components within normal limits    EKG None  Radiology No results found.  Procedures Procedures (including critical care time)  Medications Ordered in ED Medications - No data to display  ED Course  I have reviewed the triage vital signs and the nursing notes.  Pertinent labs & imaging results that were available during my care of the patient were reviewed by me and considered in my medical decision making (see chart for details).    MDM Rules/Calculators/A&P  79 year old male presenting to the ED for Tarri Glenn is resting of right hand that occurred earlier today around 4 PM.  Has had  some increased swelling.  He did buy over-the-counter Benadryl but has not yet started this.  On exam, appears to have a localized histamine reaction extending down the middle finger where he was stung.  There are no signs of superimposed infection or cellulitis at this time.  Compartments are soft and easily compressible.  Recommended Benadryl for this, given his age would reserve until nighttime to avoid extreme drowsiness.  During exam, patient began complaining of abdominal pain on his left side.  States this started about 1 hour prior to arrival.  States something feels "weird" on his abdominal wall.  I did not appreciate any noted deformity, bulge, or or noted hernia on exam.  He does have some varicose veins along the left side of his abdomen.  I am not able to elicit any specific tenderness or peritoneal signs on exam.  Does report bowel movement earlier today that was normal.  He has been eating and drinking well, no vomiting or diarrhea.  No fevers.  Will check screening labs and urinalysis.  1:12 AM Went back to room as patient was asking RN to leave shortly after I assessed him.  As I was in room, results returned and are grossly reassuring.  Repeat abdominal exam without tenderness or peritoneal signs.  Patient wants to go home and go to bed. States he will follow-up with his PCP and/or GI physician on Monday if still having pain.  This seems reasonable given his reassuring labs and benign abdominal exam here.  He will benadryl for his hand like we discussed.  He will return here for any new/acute changes.  Final Clinical Impression(s) / ED Diagnoses Final diagnoses:  Wasp sting, accidental or unintentional, initial encounter  Lower abdominal  pain    Rx / DC Orders ED Discharge Orders    None       Larene Pickett, PA-C 02/28/20 0144    Veryl Speak, MD 02/28/20 [phone]

## 2020-02-28 NOTE — ED Notes (Signed)
Py lying in bed. NAD noted. Pt ready for d/c

## 2020-03-01 DIAGNOSIS — R109 Unspecified abdominal pain: Secondary | ICD-10-CM | POA: Diagnosis not present

## 2020-03-02 ENCOUNTER — Other Ambulatory Visit: Payer: Self-pay

## 2020-03-02 ENCOUNTER — Ambulatory Visit
Admission: RE | Admit: 2020-03-02 | Discharge: 2020-03-02 | Disposition: A | Discharge status: Home / Self Care | Payer: Medicare Other | Source: Ambulatory Visit | Attending: Physician Assistant | Admitting: Physician Assistant

## 2020-03-02 ENCOUNTER — Other Ambulatory Visit: Payer: Self-pay | Admitting: Physician Assistant

## 2020-03-02 DIAGNOSIS — R109 Unspecified abdominal pain: Secondary | ICD-10-CM

## 2020-03-02 DIAGNOSIS — N132 Hydronephrosis with renal and ureteral calculous obstruction: Secondary | ICD-10-CM | POA: Diagnosis not present

## 2020-03-03 DIAGNOSIS — R310 Gross hematuria: Secondary | ICD-10-CM | POA: Diagnosis not present

## 2020-03-03 DIAGNOSIS — N281 Cyst of kidney, acquired: Secondary | ICD-10-CM | POA: Diagnosis not present

## 2020-03-03 DIAGNOSIS — N201 Calculus of ureter: Secondary | ICD-10-CM | POA: Diagnosis not present

## 2020-03-03 DIAGNOSIS — R809 Proteinuria, unspecified: Secondary | ICD-10-CM | POA: Diagnosis not present

## 2020-03-03 DIAGNOSIS — R82998 Other abnormal findings in urine: Secondary | ICD-10-CM | POA: Diagnosis not present

## 2020-03-03 DIAGNOSIS — N1330 Unspecified hydronephrosis: Secondary | ICD-10-CM | POA: Diagnosis not present

## 2020-03-04 DIAGNOSIS — N201 Calculus of ureter: Secondary | ICD-10-CM | POA: Insufficient documentation

## 2020-03-08 DIAGNOSIS — Z20828 Contact with and (suspected) exposure to other viral communicable diseases: Secondary | ICD-10-CM | POA: Diagnosis not present

## 2020-03-08 DIAGNOSIS — Z20822 Contact with and (suspected) exposure to covid-19: Secondary | ICD-10-CM | POA: Diagnosis not present

## 2020-03-15 DIAGNOSIS — I10 Essential (primary) hypertension: Secondary | ICD-10-CM | POA: Diagnosis not present

## 2020-03-15 DIAGNOSIS — Z85820 Personal history of malignant melanoma of skin: Secondary | ICD-10-CM | POA: Diagnosis not present

## 2020-03-15 DIAGNOSIS — Z79899 Other long term (current) drug therapy: Secondary | ICD-10-CM | POA: Diagnosis not present

## 2020-03-15 DIAGNOSIS — Z8546 Personal history of malignant neoplasm of prostate: Secondary | ICD-10-CM | POA: Diagnosis not present

## 2020-03-15 DIAGNOSIS — N132 Hydronephrosis with renal and ureteral calculous obstruction: Secondary | ICD-10-CM | POA: Diagnosis not present

## 2020-03-15 DIAGNOSIS — Z7982 Long term (current) use of aspirin: Secondary | ICD-10-CM | POA: Diagnosis not present

## 2020-03-15 DIAGNOSIS — Z7984 Long term (current) use of oral hypoglycemic drugs: Secondary | ICD-10-CM | POA: Diagnosis not present

## 2020-03-15 DIAGNOSIS — Z87442 Personal history of urinary calculi: Secondary | ICD-10-CM | POA: Diagnosis not present

## 2020-03-15 DIAGNOSIS — N201 Calculus of ureter: Secondary | ICD-10-CM | POA: Diagnosis not present

## 2020-03-15 DIAGNOSIS — E785 Hyperlipidemia, unspecified: Secondary | ICD-10-CM | POA: Diagnosis not present

## 2020-03-15 DIAGNOSIS — E119 Type 2 diabetes mellitus without complications: Secondary | ICD-10-CM | POA: Diagnosis not present

## 2020-03-28 DIAGNOSIS — Z466 Encounter for fitting and adjustment of urinary device: Secondary | ICD-10-CM | POA: Diagnosis not present

## 2020-03-28 DIAGNOSIS — Z87442 Personal history of urinary calculi: Secondary | ICD-10-CM | POA: Diagnosis not present

## 2020-04-26 ENCOUNTER — Other Ambulatory Visit: Payer: Self-pay

## 2020-04-26 ENCOUNTER — Encounter: Payer: Self-pay | Admitting: Podiatry

## 2020-04-26 ENCOUNTER — Ambulatory Visit (INDEPENDENT_AMBULATORY_CARE_PROVIDER_SITE_OTHER): Payer: Medicare Other | Admitting: Podiatry

## 2020-04-26 VITALS — Temp 96.2°F

## 2020-04-26 DIAGNOSIS — B351 Tinea unguium: Secondary | ICD-10-CM

## 2020-04-26 DIAGNOSIS — M79676 Pain in unspecified toe(s): Secondary | ICD-10-CM

## 2020-04-26 DIAGNOSIS — E119 Type 2 diabetes mellitus without complications: Secondary | ICD-10-CM | POA: Insufficient documentation

## 2020-04-26 NOTE — Progress Notes (Signed)
This patient returns to my office for at risk foot care.  This patient requires this care by a professional since this patient will be at risk due to having  Diabetes.   This patient is unable to cut nails himself since the patient cannot reach his nails.These nails are painful walking and wearing shoes.  This patient presents for at risk foot care today.  General Appearance  Alert, conversant and in no acute stress.  Vascular  Dorsalis pedis and posterior tibial  pulses are palpable  bilaterally.  Capillary return is within normal limits  bilaterally. Temperature is within normal limits  bilaterally.  Neurologic  Senn-Weinstein monofilament wire test within normal limits  bilaterally. Muscle power within normal limits bilaterally.  Nails Thick disfigured discolored nails with subungual debris  from hallux to fifth toes bilaterally. No evidence of bacterial infection or drainage bilaterally.  Orthopedic  No limitations of motion  feet .  No crepitus or effusions noted.  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  Prominent metatarsal heads  B/L.  Skin  normotropic skin with no porokeratosis noted bilaterally.  No signs of infections or ulcers noted.     Onychomycosis  Pain in right toes  Pain in left toes  Consent was obtained for treatment procedures.   Mechanical debridement of nails 1-5  bilaterally performed with a nail nipper.  Filed with dremel without incident.    Return office visit    10 weeks                  Told patient to return for periodic foot care and evaluation due to potential at risk complications.   Gardiner Barefoot DPM

## 2020-05-28 DIAGNOSIS — N200 Calculus of kidney: Secondary | ICD-10-CM | POA: Insufficient documentation

## 2020-06-15 NOTE — Progress Notes (Signed)
Pt stopped by office at [facility]. Inquiring about ways to get back to exercise. Offered PREP to him and suggested some Y classes for him.  He is interested in PREP after explaining program. Okay with contacting MD office to get referral to PREP.  Message left with MD office Harlan Stains reference okay for PREP. Await cb then schedule intake. Next class will start 06/27/20 M/W 1p-215p

## 2020-06-16 ENCOUNTER — Telehealth: Payer: Self-pay

## 2020-06-16 NOTE — Telephone Encounter (Signed)
Call back from MD office via VM. Call placed back to md office Message from MD. Faythe Ghee to particpate as long as geared towards seniors. Program is modified to each participant based on their physical and health abilities and Patient goals.  Will set up intake appt for him.

## 2020-06-17 ENCOUNTER — Telehealth: Payer: Self-pay

## 2020-06-17 NOTE — Telephone Encounter (Signed)
Called pt to confirm intake appt on [phone] Scheduled for 7/22 at 11am at [facility] .

## 2020-06-27 DIAGNOSIS — E1140 Type 2 diabetes mellitus with diabetic neuropathy, unspecified: Secondary | ICD-10-CM | POA: Diagnosis not present

## 2020-06-27 DIAGNOSIS — E1151 Type 2 diabetes mellitus with diabetic peripheral angiopathy without gangrene: Secondary | ICD-10-CM | POA: Diagnosis not present

## 2020-06-27 DIAGNOSIS — S51812A Laceration without foreign body of left forearm, initial encounter: Secondary | ICD-10-CM | POA: Diagnosis not present

## 2020-06-27 DIAGNOSIS — Z1159 Encounter for screening for other viral diseases: Secondary | ICD-10-CM | POA: Diagnosis not present

## 2020-06-27 DIAGNOSIS — I10 Essential (primary) hypertension: Secondary | ICD-10-CM | POA: Diagnosis not present

## 2020-06-27 DIAGNOSIS — E785 Hyperlipidemia, unspecified: Secondary | ICD-10-CM | POA: Diagnosis not present

## 2020-06-27 DIAGNOSIS — Z0000 Encounter for general adult medical examination without abnormal findings: Secondary | ICD-10-CM | POA: Diagnosis not present

## 2020-06-27 DIAGNOSIS — C61 Malignant neoplasm of prostate: Secondary | ICD-10-CM | POA: Diagnosis not present

## 2020-06-27 DIAGNOSIS — F325 Major depressive disorder, single episode, in full remission: Secondary | ICD-10-CM | POA: Diagnosis not present

## 2020-06-27 NOTE — Progress Notes (Signed)
[facility] Report   Patient Details  Name: TYKEE HEIDEMAN MRN: [id number] Date of Birth: November 14, 1941 Age: 79 y.o. PCP: Harlan Stains, MD  Vitals:   06/27/20 1610  BP: (!) 118/62  Pulse: 73  SpO2: 96%  Weight: 197 lb (89.4 kg)  Height: 5\' 7"  (1.702 m)      [employer] Eval - 06/27/20 1600      Referral    Referring Provider self     Reason for referral Inactivity    Program Start Date 06/27/20   MW 1pm-215pm     Measurement   Neck measurement 16 Inches    Waist Circumference 47 inches    Body fat 36.2 percent      Information for Trainer   Goals Wants to walk better    Current Exercise walk 30 min occasionally    Orthopedic Concerns denies    Pertinent Medical History DM, Cholesterol, prostate cancer    Restrictions/Precautions Diabetic snack before exercise;Fall risk;Assistive device    Medications that affect exercise Medication causing dizziness/drowsiness      Timed Up and Go (TUGS)   Timed Up and Go Moderate risk 10-12 seconds          Past Medical History:  Diagnosis Date  . 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 Arthritis    fingers and toes   . Bradycardia 02/10/2019  . Cancer ([place])   . Congenital deformity of ankle joint   . Depression    a long time ago   . Diabetes mellitus without complication ([place])    type 2  . Hyperlipidemia   . Hypertension   . Inguinal hernia   . Prostate cancer ([place])   . Skin cancer (melanoma) ([place])    historical   . Wears hearing aid    bialteral    Past Surgical History:  Procedure Laterality Date  . INGUINAL HERNIA REPAIR     right and left hernias  miltiple times with mesh placement   . INGUINAL HERNIA REPAIR Left 12/26/2018   Procedure: OPEN LEFT INGUINAL HERNIA WITH MESH;  Surgeon: Johnathan Hausen, MD;  Location: [facility] ORS;  Service: General;  Laterality: Left;  . nose myeloma    . PROSTATECTOMY  2005  . TOTAL HIP ARTHROPLASTY  "long time ago "   bilateral ; Alusio     Social History   Tobacco Use  Smoking Status Never Smoker  Smokeless Tobacco Never Used   Will miss class on 28th. So will start 8/2. Requests reminder emails but explained can text him, generally don't do reminders.      Barnett Hatter 06/27/2020, 4:13 PM

## 2020-06-29 DIAGNOSIS — C8409 Mycosis fungoides, extranodal and solid organ sites: Secondary | ICD-10-CM | POA: Diagnosis not present

## 2020-06-29 DIAGNOSIS — L821 Other seborrheic keratosis: Secondary | ICD-10-CM | POA: Diagnosis not present

## 2020-06-29 DIAGNOSIS — Z85820 Personal history of malignant melanoma of skin: Secondary | ICD-10-CM | POA: Diagnosis not present

## 2020-06-29 DIAGNOSIS — D2272 Melanocytic nevi of left lower limb, including hip: Secondary | ICD-10-CM | POA: Diagnosis not present

## 2020-06-29 DIAGNOSIS — D225 Melanocytic nevi of trunk: Secondary | ICD-10-CM | POA: Diagnosis not present

## 2020-06-29 DIAGNOSIS — L578 Other skin changes due to chronic exposure to nonionizing radiation: Secondary | ICD-10-CM | POA: Diagnosis not present

## 2020-07-11 ENCOUNTER — Encounter: Payer: Self-pay | Admitting: Radiation Oncology

## 2020-07-11 NOTE — Progress Notes (Signed)
GU Location of Tumor / Histology: prostatic adenocarcinoma  If Prostate Cancer, Gleason Score is (4 + 3).   [name] had a radical retropubic prostatectomy in 2005. His PSA began to rise in 2011. PSA collected 05/26/20 was 12.39.  FINAL DIAGNOSIS    1. PROSTATE, RIGHT, NEEDLE BIOPSY(IES): BENIGN PROSTATIC TISSUE   WITH FOCAL ACUTE AND CHRONIC INFLAMMATION; NO TUMOR IDENTIFIED.    2. PROSTATE, LEFT, NEEDLE BIOPSY(IES): FOCAL ADENOCARCINOMA OF   PROSTATE, GLEASON' S COMBINED SCORE OF 7 (4+3) INVOLVING   APPROXIMATELY 5% OF THE TISSUE.   Past/Anticipated interventions by urology, if any: prostate biopsy, prostatectomy, prescribed oxybutynin, active surveillance, referral to Dr. Tammi Klippel to discuss ADT, discussion about intermittent ADT, PET scan (parotid gland?)    Past/Anticipated interventions by medical oncology, if any: no  Weight changes, if any:   Bowel/Bladder complaints, if any: Mixed urinary incontinence. Nocturia x 1-2.    Nausea/Vomiting, if any:   Pain issues, if any:    SAFETY ISSUES: Prior radiation?  Pacemaker/ICD?  Possible current pregnancy? no, male patient Is the patient on methotrexate?   Current Complaints / other details:  79 year old male. Single. Doesn't want ADT. History of melanoma of nose. Mother and father with hx of cancer. Brother with hx of lung cancer.

## 2020-07-11 NOTE — Progress Notes (Signed)
[facility] [employer] PREP Weekly Session   Patient Details  Name: Jay Bailey MRN: [id number] Date of Birth: 1941/10/18 Age: 79 y.o. PCP: Harlan Stains, MD  There were no vitals filed for this visit.   [employer] Weekly seesion - 07/11/20 1400      Weekly Session   Topic Discussed Healthy eating tips    Classes attended to date 4          Grateful for: to be here   Barnett Hatter 07/11/2020, 2:56 PM

## 2020-07-12 ENCOUNTER — Ambulatory Visit
Admission: RE | Admit: 2020-07-12 | Discharge: 2020-07-12 | Disposition: A | Discharge status: Home / Self Care | Payer: Medicare Other | Source: Ambulatory Visit | Attending: Radiation Oncology | Admitting: Radiation Oncology

## 2020-07-12 ENCOUNTER — Encounter: Payer: Self-pay | Admitting: Radiation Oncology

## 2020-07-12 ENCOUNTER — Ambulatory Visit: Payer: Medicare Other

## 2020-07-12 NOTE — Progress Notes (Addendum)
Received call from registration staff that patient is in the cancer center wishing to cancel his appointment for today and reschedule for a day/time when his sister can be with him. Explained that Dr. Tammi Klippel doesn't have any openings until late September and encouraged the patient be seen today. Patient continued to express his desire to reschedule. Informed providers of these findings. Routed this message to rad onc scheduling team.

## 2020-07-15 DIAGNOSIS — Z8546 Personal history of malignant neoplasm of prostate: Secondary | ICD-10-CM | POA: Diagnosis not present

## 2020-07-15 DIAGNOSIS — R9389 Abnormal findings on diagnostic imaging of other specified body structures: Secondary | ICD-10-CM | POA: Diagnosis not present

## 2020-07-15 DIAGNOSIS — C4331 Malignant melanoma of nose: Secondary | ICD-10-CM | POA: Diagnosis not present

## 2020-07-15 DIAGNOSIS — Z8522 Personal history of malignant neoplasm of nasal cavities, middle ear, and accessory sinuses: Secondary | ICD-10-CM | POA: Diagnosis not present

## 2020-07-19 ENCOUNTER — Encounter: Payer: Self-pay | Admitting: Podiatry

## 2020-07-19 ENCOUNTER — Other Ambulatory Visit: Payer: Self-pay

## 2020-07-19 ENCOUNTER — Ambulatory Visit (INDEPENDENT_AMBULATORY_CARE_PROVIDER_SITE_OTHER): Payer: Medicare Other | Admitting: Podiatry

## 2020-07-19 DIAGNOSIS — B351 Tinea unguium: Secondary | ICD-10-CM

## 2020-07-19 DIAGNOSIS — M79676 Pain in unspecified toe(s): Secondary | ICD-10-CM

## 2020-07-19 DIAGNOSIS — E119 Type 2 diabetes mellitus without complications: Secondary | ICD-10-CM

## 2020-07-19 NOTE — Progress Notes (Signed)
This patient returns to my office for at risk foot care.  This patient requires this care by a professional since this patient will be at risk due to having  Diabetes.   This patient is unable to cut nails himself since the patient cannot reach his nails.These nails are painful walking and wearing shoes.  This patient presents for at risk foot care today.  General Appearance  Alert, conversant and in no acute stress.  Vascular  Dorsalis pedis and posterior tibial  pulses are weakly  palpable  bilaterally.  Capillary return is within normal limits  bilaterally. Temperature is within normal limits  bilaterally.  Neurologic  Senn-Weinstein monofilament wire test within normal limits  bilaterally. Muscle power within normal limits bilaterally.  Nails Thick disfigured discolored nails with subungual debris  from hallux to fifth toes bilaterally. No evidence of bacterial infection or drainage bilaterally.  Orthopedic  No limitations of motion  feet .  No crepitus or effusions noted.  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  Prominent metatarsal heads  B/L.  Skin  normotropic skin with no porokeratosis noted bilaterally.  No signs of infections or ulcers noted.     Onychomycosis  Pain in right toes  Pain in left toes  Consent was obtained for treatment procedures.   Mechanical debridement of nails 1-5  bilaterally performed with a nail nipper.  Filed with dremel without incident.    Return office visit    10 weeks                  Told patient to return for periodic foot care and evaluation due to potential at risk complications.   Gabrella Stroh DPM  

## 2020-07-25 DIAGNOSIS — E042 Nontoxic multinodular goiter: Secondary | ICD-10-CM | POA: Diagnosis not present

## 2020-07-25 DIAGNOSIS — C4331 Malignant melanoma of nose: Secondary | ICD-10-CM | POA: Diagnosis not present

## 2020-08-05 ENCOUNTER — Ambulatory Visit: Payer: Medicare Other | Admitting: Radiation Oncology

## 2020-08-05 ENCOUNTER — Ambulatory Visit: Payer: Medicare Other

## 2020-08-05 DIAGNOSIS — Z23 Encounter for immunization: Secondary | ICD-10-CM | POA: Diagnosis not present

## 2020-08-15 NOTE — Progress Notes (Addendum)
Location of Tumor / Histology: prostatic adenocarcinoma  If Prostate Cancer, Gleason Score is (4 + 3).   [name] had a radical retropubic prostatectomy in 2005. His PSA began to rise in 2011. PSA collected 05/26/20 was 12.39.  FINAL DIAGNOSIS   1. PROSTATE, RIGHT, NEEDLE BIOPSY(IES): BENIGN PROSTATIC TISSUE  WITH FOCAL ACUTE AND CHRONIC INFLAMMATION; NO TUMOR IDENTIFIED.   2. PROSTATE, LEFT, NEEDLE BIOPSY(IES): FOCAL ADENOCARCINOMA OF  PROSTATE, GLEASON' S COMBINED SCORE OF 7 (4+3) INVOLVING  APPROXIMATELY 5% OF THE TISSUE.   Past/Anticipated interventions by urology, if any: prostate biopsy, prostatectomy (2005), prescribed oxybutynin, active surveillance, referral to Dr. Tammi Klippel to discuss radiation options, discussion about intermittent ADT (patient doesn't want ADT and hasn't received any), PET scan (parotid gland--hx of melanoma??)    Past/Anticipated interventions by medical oncology, if any: no  Weight changes, if any: Denies  Bowel/Bladder complaints, if any: IPSS 13. SHIM 5.  Mixed urinary incontinence. Changes depends once per day.  Nocturia x 1-2.  Denies dysuria or hematuria. Endorses taking oxybutynin x 5-6 years. Taking medication to manage constipation.   Nausea/Vomiting, if any: denies  Pain issues, if any:    SAFETY ISSUES:  Prior radiation? denies  Pacemaker/ICD? denies  Possible current pregnancy? no, male patient  Is the patient on methotrexate? denies  Current Complaints / other details:  79 year old male. Single. Doesn't want ADT. History of melanoma of nose. Mother with hx of colon ca. Brother with hx of lung cancer.  Patient has been taking ditropan for 5-6 years now.   Sister in law, [place], accompanied patient to appt. Seen by Dr. Tammi Klippel in 2007. Patient doesn't want radiation. Sister in law questions how long they can wait before he absolutely has to have radiation. Question deferred to Dr. Tammi Klippel.

## 2020-08-16 ENCOUNTER — Encounter: Payer: Self-pay | Admitting: Medical Oncology

## 2020-08-16 ENCOUNTER — Other Ambulatory Visit: Payer: Self-pay

## 2020-08-16 ENCOUNTER — Ambulatory Visit
Admission: RE | Admit: 2020-08-16 | Discharge: 2020-08-16 | Disposition: A | Discharge status: Home / Self Care | Payer: Medicare Other | Source: Ambulatory Visit | Attending: Radiation Oncology | Admitting: Radiation Oncology

## 2020-08-16 ENCOUNTER — Encounter: Payer: Self-pay | Admitting: Radiation Oncology

## 2020-08-16 VITALS — BP 162/67 | HR 60 | Temp 97.9°F | Resp 20 | Ht 67.0 in | Wt 194.8 lb

## 2020-08-16 DIAGNOSIS — C61 Malignant neoplasm of prostate: Secondary | ICD-10-CM | POA: Diagnosis not present

## 2020-08-16 DIAGNOSIS — Z9079 Acquired absence of other genital organ(s): Secondary | ICD-10-CM | POA: Diagnosis not present

## 2020-08-16 DIAGNOSIS — Z85820 Personal history of malignant melanoma of skin: Secondary | ICD-10-CM | POA: Diagnosis not present

## 2020-08-16 DIAGNOSIS — R9721 Rising PSA following treatment for malignant neoplasm of prostate: Secondary | ICD-10-CM | POA: Diagnosis not present

## 2020-08-16 DIAGNOSIS — K118 Other diseases of salivary glands: Secondary | ICD-10-CM | POA: Diagnosis not present

## 2020-08-16 NOTE — Progress Notes (Signed)
Introduced myself to patient and his sister-in- Sports coach as the prostate nurse navigator and discuss my role. He states his PSA is slowly rising after prostatectomy in 2005. He is not sure he wants to pursue salvage radiation but is here to learn more about it. He continues to have some urinary incontinence and is worried the radiation will makes things worse. I gave them my business card and asked them to reach out to me with questions or concerns. He voiced understanding. He was scheduled to see Dr. Tammi Klippel 8/10, but rescheduled so his sister-in-law could attend.

## 2020-08-16 NOTE — Progress Notes (Signed)
Radiation Oncology         [phone] ________________________________  Initial outpatient Consultation  Name: Jay Bailey MRN: [id number]  Date: 08/16/2020  DOB: 10/27/1941  CC:Harlan Stains, MD  Myrlene Broker, MD   REFERRING PHYSICIAN: Myrlene Broker, MD  DIAGNOSIS: 79 y.o. gentleman with biochemical recurrence of prostate cancer with PSA of 12.39 s/p RRP in 01/2004 for Stage pT2a, pN0 Gleason 3+4 prostate cancer.    ICD-10-CM   1. Malignant neoplasm of prostate [facility])  C61     HISTORY OF PRESENT ILLNESS: Jay Bailey is a 79 y.o. male with a diagnosis of prostate cancer. He was initially diagnosed with prostate cancer in 09/2003.  He opted to proceed with prostatectomy on 01/28/2004 under the  Care of Dr. Rosana Hoes, with surgical pathology showing pT2a, Gleason 3+4 prostatic adenocarcinoma involving the right lobe. Margins, seminal vesicles, and all 6 lymph nodes were negative, and extracapsular extension was also not identified. Postoperative PSA was initially undetectable.  He developed a biochemical recurrence, and his PSA had risen to 0.5 by 11/2010. Since that time, it has continued to slowly rise, but he has opted to forego any form of treatment. In 03/2016, it jumped from 2.72 to 4.99 in 09/2016. Most recent PSA was 12.39 in 05/2020.  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  This is being monitored.  The patient reviewed the biopsy results with his urologist and he has kindly been referred today for discussion of potential radiation treatment options.   PREVIOUS RADIATION THERAPY: No  PAST MEDICAL HISTORY:  Past Medical History:  Diagnosis Date  . 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  Arthritis    fingers and toes   . Bradycardia 02/10/2019  . Cancer ([place])   . Congenital deformity of ankle joint   . Depression    a long time ago   . Diabetes mellitus without complication ([place])    type 2  . Hyperlipidemia   . Hypertension   . Inguinal hernia   . Prostate cancer ([place])   . Skin cancer (melanoma) ([place])    historical   . Wears hearing aid    bialteral       PAST SURGICAL HISTORY: Past Surgical History:  Procedure Laterality Date  . INGUINAL HERNIA REPAIR     right and left hernias  miltiple times with mesh placement   . INGUINAL HERNIA REPAIR Left 12/26/2018   Procedure: OPEN LEFT INGUINAL HERNIA WITH MESH;  Surgeon: Johnathan Hausen, MD;  Location: [facility];  Service: General;  Laterality: Left;  . nose myeloma    . PROSTATECTOMY  2005  . TOTAL HIP ARTHROPLASTY  "long time ago "   bilateral ; [place]     FAMILY HISTORY:  Family History  Problem Relation Age of Onset  . Colon cancer Mother   . Heart disease Father   . Lung cancer Brother   . Pancreatic cancer Neg Hx   . Prostate cancer Neg Hx   . Breast cancer Neg Hx     SOCIAL HISTORY:  Social History   Socioeconomic History  . Marital status: Single    Spouse name: Not on file  . Number of children: Not on file  .  Years of education: Not on file  . Highest education level: Not on file  Occupational History  . Not on file  Tobacco Use  . Smoking status: Never Smoker  . Smokeless tobacco: Never Used  Vaping Use  . Vaping Use: Never used  Substance and Sexual Activity  . Alcohol use: No  . Drug use: No  . Sexual activity: Not Currently  Other Topics Concern  . Not on file  Social History Narrative  . Not on file   Social Determinants of Health   Financial Resource Strain:   . Difficulty of Paying Living Expenses: Not on file  Food Insecurity:   . Worried About Charity fundraiser in the Last Year: Not on file  . Ran Out of Food in the Last Year: Not on file  Transportation Needs:   .  Lack of Transportation (Medical): Not on file  . Lack of Transportation (Non-Medical): Not on file  Physical Activity:   . Days of Exercise per Week: Not on file  . Minutes of Exercise per Session: Not on file  Stress:   . Feeling of Stress : Not on file  Social Connections:   . Frequency of Communication with Friends and Family: Not on file  . Frequency of Social Gatherings with Friends and Family: Not on file  . Attends Religious Services: Not on file  . Active Member of Clubs or Organizations: Not on file  . Attends Archivist Meetings: Not on file  . Marital Status: Not on file  Intimate Partner Violence:   . Fear of Current or Ex-Partner: Not on file  . Emotionally Abused: Not on file  . Physically Abused: Not on file  . Sexually Abused: Not on file    ALLERGIES: Patient has no known allergies.  MEDICATIONS:  Current Outpatient Medications  Medication Sig Dispense Refill  . amLODipine (NORVASC) 2.5 MG tablet Take 2.5 mg by mouth daily.    Marland Kitchen ascorbic acid (VITAMIN C) 1000 MG tablet Take 1,000 mg by mouth daily.    Marland Kitchen aspirin 81 MG tablet Take 81 mg by mouth daily.    Marland Kitchen atorvastatin (LIPITOR) 80 MG tablet Take 80 mg by mouth daily.    . folic acid (FOLVITE) 924 MCG tablet Take 400 mcg by mouth daily.     . Glucosamine-Chondroit-Vit C-Mn (GLUCOSAMINE CHONDR 1500 COMPLX PO) Take 1 tablet by mouth daily.    . irbesartan-hydrochlorothiazide (AVALIDE) 300-12.5 MG tablet Take 1 tablet by mouth daily.     Marland Kitchen linaclotide (LINZESS) 145 MCG CAPS capsule Take 145 mcg by mouth daily as needed (constipation).    . metFORMIN (GLUCOPHAGE-XR) 500 MG 24 hr tablet     . Multiple Vitamin (MULTIVITAMIN) tablet Take 1 tablet by mouth daily.    Marland Kitchen oxybutynin (DITROPAN-XL) 5 MG 24 hr tablet TAKE 1 TABLET ONCE DAILY.    Marland Kitchen polyethylene glycol powder (GLYCOLAX/MIRALAX) powder Take 17 g by mouth daily as needed for moderate constipation.     . vitamin E 400 UNIT capsule Take 400 Units by mouth  daily.    Marland Kitchen amoxicillin (AMOXIL) 500 MG capsule Take 2,000 mg by mouth See admin instructions. For Dental Procedures (Patient not taking: Reported on 08/16/2020)     No current facility-administered medications for this encounter.    REVIEW OF SYSTEMS:  On review of systems, the patient reports that he is doing well overall. He denies any chest pain, shortness of breath, cough, fevers, chills, night sweats, unintended weight changes. He  denies any bowel disturbances, and denies abdominal pain, nausea or vomiting. He denies any new musculoskeletal or joint aches or pains. His IPSS was 13, indicating moderate urinary symptoms. He reports continued mixed urinary incontinence, changing Depends once a day. He also reports nocturia x1-2. 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 A complete review of systems is obtained and is otherwise negative.    PHYSICAL EXAM:  Wt Readings from Last 3 Encounters:  08/16/20 194 lb 12.8 oz (88.4 kg)  06/27/20 197 lb (89.4 kg)  02/27/20 189 lb (85.7 kg)   Temp Readings from Last 3 Encounters:  08/16/20 97.9 F (36.6 C) (Oral)  04/26/20 (!) 96.2 F (35.7 C)  02/28/20 98.6 F (37 C) (Oral)   BP Readings from Last 3 Encounters:  08/16/20 (!) 162/67  06/27/20 (!) 118/62  02/28/20 139/68   Pulse Readings from Last 3 Encounters:  08/16/20 60  06/27/20 73  02/28/20 64   Pain Assessment Pain Score: 0-No pain/10  In general this is a well appearing Caucasian male in no acute distress. He is alert and oriented x4 and appropriate throughout the examination. HEENT reveals that the patient is normocephalic, atraumatic. EOMs are intact. PERRLA. Skin is intact without any evidence of gross lesions. Cardiopulmonary assessment is negative for acute distress and he exhibits normal effort. The abdomen is soft, non tender, non distended. Lower extremities are negative for pretibial pitting edema, deep calf tenderness, cyanosis or clubbing.  KPS  = 60  100 - Normal; no complaints; no evidence of disease. 90   - Able to carry on normal activity; minor signs or symptoms of disease. 80   - Normal activity with effort; some signs or symptoms of disease. 27   - Cares for self; unable to carry on normal activity or to do active work. 60   - Requires occasional assistance, but is able to care for most of his personal needs. 50   - Requires considerable assistance and frequent medical care. 80   - Disabled; requires special care and assistance. 70   - Severely disabled; hospital admission is indicated although death not imminent. 37   - Very sick; hospital admission necessary; active supportive treatment necessary. 10   - Moribund; fatal processes progressing rapidly. 0     - Dead  Karnofsky DA, Abelmann [place], Craver LS and [place] [facility] [phone]) The use of the nitrogen mustards in the palliative treatment of carcinoma: with particular reference to bronchogenic carcinoma Cancer 1 634-56  LABORATORY DATA:  Lab Results  Component Value Date   WBC 10.5 02/28/2020   HGB 15.4 02/28/2020   HCT 46.4 02/28/2020   MCV 98.5 02/28/2020   PLT 152 02/28/2020   Lab Results  Component Value Date   NA 141 02/28/2020   K 4.2 02/28/2020   CL 106 02/28/2020   CO2 25 02/28/2020   Lab Results  Component Value Date   ALT 36 02/28/2020   AST 24 02/28/2020   ALKPHOS 111 02/28/2020   BILITOT 0.7 02/28/2020     RADIOGRAPHY: No results found.    IMPRESSION/PLAN: 1. 79 y.o. gentleman withbiochemical recurrence of prostate cancer with PSA of 12.39 s/p RRP in 01/2004 for Stage pT2a, pN0 Gleason 3+4 prostate cancer. Today we reviewed the findings and workup thus far.  We discussed the natural history of prostate cancer.  We reviewed the the implications of positive margins, extracapsular extension, and seminal vesicle involvement on the risk of prostate cancer recurrence, particularly in light of  rising, detectable PSA postoperatively. We also reviewed the  recent Axumin PET scan results which indicate recurrence of disease in the surgical bed without evidence of distant disease. We discussed salvage radiation with or without the use of ADT in patients who develop recurrent disease as evidenced by postoperative PSA > 0.3, which can lead to excellent results in terms of disease control and survival. We discussed radiation treatment directed to the prostatic fossa with regard to the logistics and delivery of external beam radiation treatment.   At the conclusion of our conversation, the patient is interested in continuing in active surveillance under the care of Dr. Rosana Hoes. He would like to repeat a PSA in 6 months and if this continues to trend up, would consider undergoing PSMA scan to further evaluate the extent of disease recurrence to determine whether he might consider salvage radiation +/- ADT versus ADT alone.  He and his sister-in-law, Lollie Marrow, appear to have a good understanding of his disease and our recommendations for treatment and are comfortable with the stated plan.  We will share our discussion with Dr. Rosana Hoes and look forward to continuing to follow his progress. Of course, we are happy to continue to participate in his care if clinically indicated and he is interested in treatment in the future.    Nicholos Johns, PA-C    Tyler Pita, MD  [facility] Direct Dial: [phone]  Fax: [phone] [email or web address]  Skype  [email or web address]   This document serves as a record of services personally performed by Tyler Pita, MD and Freeman Caldron, PA-C. It was created on their behalf by Wilburn Mylar, a trained medical scribe. The creation of this record is based on the scribe's personal observations and the provider's statements to them. This document has been checked and approved by the attending provider.

## 2020-08-17 ENCOUNTER — Encounter: Payer: Self-pay | Admitting: Radiation Oncology

## 2020-08-24 NOTE — Progress Notes (Signed)
Stopped by [employer] office, wanted area on lower right leg looked at. He hit it on the door.  Noted somewhat read lump with scabs on top on right lower leg.  Asked for it to have neosporin and a bandaid. Both applied Encouraged to watch for infection and call MD if it doesn't get better. Asked if it would cause a clot. Looks to be old and encouraged to follow up with MD if it doesn't get better.

## 2020-08-26 DIAGNOSIS — S8011XA Contusion of right lower leg, initial encounter: Secondary | ICD-10-CM | POA: Diagnosis not present

## 2020-09-01 ENCOUNTER — Encounter: Payer: Self-pay | Admitting: Medical Oncology

## 2020-09-16 DIAGNOSIS — Z23 Encounter for immunization: Secondary | ICD-10-CM | POA: Diagnosis not present

## 2020-09-30 ENCOUNTER — Encounter: Payer: Self-pay | Admitting: Urology

## 2020-09-30 ENCOUNTER — Ambulatory Visit
Admission: RE | Admit: 2020-09-30 | Discharge: 2020-09-30 | Disposition: A | Discharge status: Home / Self Care | Payer: Medicare Other | Source: Ambulatory Visit | Attending: Radiation Oncology | Admitting: Radiation Oncology

## 2020-09-30 ENCOUNTER — Other Ambulatory Visit: Payer: Self-pay

## 2020-09-30 VITALS — BP 158/62 | HR 58 | Temp 97.9°F | Resp 20 | Ht 67.0 in | Wt 202.4 lb

## 2020-09-30 DIAGNOSIS — Z7982 Long term (current) use of aspirin: Secondary | ICD-10-CM | POA: Insufficient documentation

## 2020-09-30 DIAGNOSIS — R9721 Rising PSA following treatment for malignant neoplasm of prostate: Secondary | ICD-10-CM | POA: Diagnosis not present

## 2020-09-30 DIAGNOSIS — I10 Essential (primary) hypertension: Secondary | ICD-10-CM | POA: Diagnosis not present

## 2020-09-30 DIAGNOSIS — F329 Major depressive disorder, single episode, unspecified: Secondary | ICD-10-CM | POA: Diagnosis not present

## 2020-09-30 DIAGNOSIS — E785 Hyperlipidemia, unspecified: Secondary | ICD-10-CM | POA: Diagnosis not present

## 2020-09-30 DIAGNOSIS — Z79899 Other long term (current) drug therapy: Secondary | ICD-10-CM | POA: Diagnosis not present

## 2020-09-30 DIAGNOSIS — Z7984 Long term (current) use of oral hypoglycemic drugs: Secondary | ICD-10-CM | POA: Insufficient documentation

## 2020-09-30 DIAGNOSIS — M129 Arthropathy, unspecified: Secondary | ICD-10-CM | POA: Diagnosis not present

## 2020-09-30 DIAGNOSIS — E119 Type 2 diabetes mellitus without complications: Secondary | ICD-10-CM | POA: Insufficient documentation

## 2020-09-30 DIAGNOSIS — C61 Malignant neoplasm of prostate: Secondary | ICD-10-CM

## 2020-09-30 DIAGNOSIS — Z85820 Personal history of malignant melanoma of skin: Secondary | ICD-10-CM | POA: Insufficient documentation

## 2020-09-30 NOTE — Addendum Note (Signed)
Encounter addended by: Freeman Caldron, PA-C on: 09/30/2020 11:38 AM  Actions taken: Clinical Note Signed, Flowsheet accepted

## 2020-09-30 NOTE — Progress Notes (Addendum)
Radiation Oncology         [phone] ________________________________  Outpatient Re-Consultation  Name: Jay Bailey MRN: [id number]  Date: 09/30/2020  DOB: 1940-12-19  CC:Jay Stains, MD  Jay Broker, MD   REFERRING PHYSICIAN: Myrlene Broker, MD  DIAGNOSIS: 79 y.o. gentleman with biochemical recurrence of prostate cancer with PSA of 12.39 s/p RRP in 01/2004 for Stage pT2a, pN0 Gleason 3+4 prostate cancer.    ICD-10-CM   1. Malignant neoplasm of prostate [facility])  C61     HISTORY OF PRESENT ILLNESS: Jay Bailey is a 79 y.o. male with a diagnosis of biochemical recurrence of prostate cancer. He was initially diagnosed with prostate cancer in 09/2003.  He opted to proceed with prostatectomy on 01/28/2004 under the care of Dr. Lawerance Bailey, with surgical pathology confirminging pT2a, Gleason 3+4 prostatic adenocarcinoma involving the right lobe. Margins, seminal vesicles, and all 6 lymph nodes were negative, and extracapsular extension was also not identified. Postoperative PSA was initially undetectable.  He developed a biochemical recurrence, and his PSA had risen to 0.5 by 11/2010. Since that time, it has continued to slowly rise, but he has opted to forego any form of treatment. In 03/2016, it jumped from 2.72 to 4.99 in 09/2016. His most recent PSA was 12.39 in 05/2020.  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  This is being monitored.  The patient reviewed the biopsy results with his urologist and was initially referred in 08/2020 for discussion of potential salvage radiation treatment options. We met with him on 08/16/20 and at that time, he was not interested in proceeding with treatment but instead preferred to continue an  active surveillance under the care of Dr. Rosana Bailey with a repeat PSA in approximately 6 months.  However, in the meantime, he has met back with Dr. Rosana Bailey who strongly advocates for moving forward with salvage radiation with curative intent.  They also discussed the use of ST-ADT, concurrent with salvage radiation and he presents back to the clinic today for further discussion regarding the radiation treatment.  He is accompanied by his sister-in-law, Jay [facility], for today's visit.  Jay Bailey is very involved in his medical care and help to coordinate his appointments.   PREVIOUS RADIATION THERAPY: No  PAST MEDICAL HISTORY:  Past Medical History:  Diagnosis Date  . 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 Arthritis    fingers and toes   . Bradycardia 02/10/2019  . Cancer ([place])   . Congenital deformity of ankle joint   . Depression    a long time ago   . Diabetes mellitus without complication ([place])    type 2  . Hyperlipidemia   . Hypertension   . Inguinal hernia   . Prostate cancer ([place] [facility])   . Skin cancer (melanoma) ([place])    historical   . Wears hearing aid    bialteral       PAST SURGICAL HISTORY: Past Surgical History:  Procedure Laterality Date  . INGUINAL HERNIA REPAIR     right and left hernias  miltiple times with mesh placement   . INGUINAL HERNIA REPAIR Left 12/26/2018   Procedure: OPEN LEFT INGUINAL HERNIA WITH MESH;  Surgeon: Jay Hausen, MD;  Location: [facility]  [facility];  Service: General;  Laterality: Left;  . nose myeloma    . PROSTATECTOMY  2005  . TOTAL HIP ARTHROPLASTY  "long time ago "   bilateral ; [facility]     FAMILY HISTORY:  Family History  Problem Relation Age of Onset  . Colon cancer Mother   . Kidney cancer Mother   . Heart disease Father   . Lung cancer Brother   . Pancreatic cancer Neg Hx   . Prostate cancer Neg Hx   . Breast cancer Neg Hx     SOCIAL HISTORY:  Social History   Socioeconomic History  . Marital status: Single     Spouse name: Not on file  . Number of children: Not on file  . Years of education: Not on file  . Highest education level: Not on file  Occupational History  . Not on file  Tobacco Use  . Smoking status: Never Smoker  . Smokeless tobacco: Never Used  Vaping Use  . Vaping Use: Never used  Substance and Sexual Activity  . Alcohol use: No  . Drug use: No  . Sexual activity: Not Currently  Other Topics Concern  . Not on file  Social History Narrative  . Not on file   Social Determinants of Health   Financial Resource Strain:   . Difficulty of Paying Living Expenses: Not on file  Food Insecurity:   . Worried About Charity fundraiser in the Last Year: Not on file  . Ran Out of Food in the Last Year: Not on file  Transportation Needs:   . Lack of Transportation (Medical): Not on file  . Lack of Transportation (Non-Medical): Not on file  Physical Activity:   . Days of Exercise per Week: Not on file  . Minutes of Exercise per Session: Not on file  Stress:   . Feeling of Stress : Not on file  Social Connections:   . Frequency of Communication with Friends and Family: Not on file  . Frequency of Social Gatherings with Friends and Family: Not on file  . Attends Religious Services: Not on file  . Active Member of Clubs or Organizations: Not on file  . Attends Archivist Meetings: Not on file  . Marital Status: Not on file  Intimate Partner Violence:   . Fear of Current or Ex-Partner: Not on file  . Emotionally Abused: Not on file  . Physically Abused: Not on file  . Sexually Abused: Not on file    ALLERGIES: Patient has no known allergies.  MEDICATIONS:  Current Outpatient Medications  Medication Sig Dispense Refill  . amLODipine (NORVASC) 2.5 MG tablet Take 2.5 mg by mouth daily.    Marland Kitchen amoxicillin (AMOXIL) 500 MG capsule Take 2,000 mg by mouth See admin instructions. For Dental Procedures (Patient not taking: Reported on 08/16/2020)    . ascorbic acid (VITAMIN C)  1000 MG tablet Take 1,000 mg by mouth daily.    Marland Kitchen aspirin 81 MG tablet Take 81 mg by mouth daily.    Marland Kitchen atorvastatin (LIPITOR) 80 MG tablet Take 80 mg by mouth daily.    . folic acid (FOLVITE) 128 MCG tablet Take 400 mcg by mouth daily.     . Glucosamine-Chondroit-Vit C-Mn (GLUCOSAMINE CHONDR 1500 COMPLX PO) Take 1 tablet by mouth daily.    . irbesartan-hydrochlorothiazide (AVALIDE) 300-12.5 MG tablet Take 1 tablet by mouth daily.     Marland Kitchen linaclotide (LINZESS) 145 MCG CAPS capsule Take 145 mcg by mouth daily as  needed (constipation).    . metFORMIN (GLUCOPHAGE-XR) 500 MG 24 hr tablet     . Multiple Vitamin (MULTIVITAMIN) tablet Take 1 tablet by mouth daily.    Marland Kitchen oxybutynin (DITROPAN-XL) 5 MG 24 hr tablet TAKE 1 TABLET ONCE DAILY.    Marland Kitchen polyethylene glycol powder (GLYCOLAX/MIRALAX) powder Take 17 g by mouth daily as needed for moderate constipation.     . vitamin E 400 UNIT capsule Take 400 Units by mouth daily.     No current facility-administered medications for this encounter.    REVIEW OF SYSTEMS:  On review of systems, the patient reports that he is doing well overall. He denies any chest pain, shortness of breath, cough, fevers, chills, night sweats, unintended weight changes. He denies any bowel disturbances, and denies abdominal pain, nausea or vomiting. He denies any new musculoskeletal or joint aches or pains. His IPSS was 13, indicating moderate urinary symptoms. He reports continued mixed urinary incontinence, changing Depends once a day. He also reports nocturia x1-2. 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 A complete review of systems is obtained and is otherwise negative.    PHYSICAL EXAM:  Wt Readings from Last 3 Encounters:  08/16/20 194 lb 12.8 oz (88.4 kg)  06/27/20 197 lb (89.4 kg)  02/27/20 189 lb (85.7 kg)   Temp Readings from Last 3 Encounters:  08/16/20 97.9 F (36.6 C) (Oral)  04/26/20 (!) 96.2 F (35.7 C)  02/28/20 98.6 F (37 C)  (Oral)   BP Readings from Last 3 Encounters:  08/16/20 (!) 162/67  06/27/20 (!) 118/62  02/28/20 139/68   Pulse Readings from Last 3 Encounters:  08/16/20 60  06/27/20 73  02/28/20 64    /10  In general this is a well appearing Caucasian male in no acute distress. He is alert and oriented x4 and appropriate throughout the examination. HEENT reveals that the patient is normocephalic, atraumatic. EOMs are intact. PERRLA. Skin is intact without any evidence of gross lesions. Cardiopulmonary assessment is negative for acute distress and he exhibits normal effort. The abdomen is soft, non tender, non distended. Lower extremities are negative for pretibial pitting edema, deep calf tenderness, cyanosis or clubbing.  KPS = 80  100 - Normal; no complaints; no evidence of disease. 90   - Able to carry on normal activity; minor signs or symptoms of disease. 80   - Normal activity with effort; some signs or symptoms of disease. 32   - Cares for self; unable to carry on normal activity or to do active work. 60   - Requires occasional assistance, but is able to care for most of his personal needs. 50   - Requires considerable assistance and frequent medical care. 49   - Disabled; requires special care and assistance. 58   - Severely disabled; hospital admission is indicated although death not imminent. 30   - Very sick; hospital admission necessary; active supportive treatment necessary. 10   - Moribund; fatal processes progressing rapidly. 0     - Dead  Karnofsky DA, Abelmann [place], Craver LS and Burchenal [facility] [phone]) The use of the nitrogen mustards in the palliative treatment of carcinoma: with particular reference to bronchogenic carcinoma Cancer 1 [phone]  LABORATORY DATA:  Lab Results  Component Value Date   WBC 10.5 02/28/2020   HGB 15.4 02/28/2020   HCT 46.4 02/28/2020   MCV 98.5 02/28/2020   PLT 152 02/28/2020   Lab Results  Component Value Date   NA 141 02/28/2020  K 4.2 02/28/2020    CL 106 02/28/2020   CO2 25 02/28/2020   Lab Results  Component Value Date   ALT 36 02/28/2020   AST 24 02/28/2020   ALKPHOS 111 02/28/2020   BILITOT 0.7 02/28/2020     RADIOGRAPHY: No results found.    IMPRESSION/PLAN: 1. 79 y.o. gentleman withbiochemical recurrence of prostate cancer with PSA of 12.39 s/p RRP in 01/2004 for Stage pT2a, pN0 Gleason 3+4 prostate cancer. Today we reviewed the findings and workup thus far.  We discussed the natural history of prostate cancer.  We reviewed the the implications of positive margins, extracapsular extension, and seminal vesicle involvement on the risk of prostate cancer recurrence, particularly in light of rising, detectable PSA postoperatively. We also reviewed the recent Axumin PET scan results which indicate recurrence of disease in the surgical bed without evidence of distant disease. We discussed salvage radiation with or without the use of ADT in patients who develop recurrent disease as evidenced by postoperative PSA > 0.3, which can lead to excellent results in terms of disease control and survival. We again discussed radiation treatment directed to the prostatic fossa with regard to the logistics and delivery of a 7-1/2-week course of daily external beam radiation treatment.  We reviewed  the risks, benefits, short and long-term effects associated with radiotherapy in this setting.  We also detailed the role of ADT in the treatment of recurrent prostate cancer and outlined the associated side effects that could be expected with this therapy.  He and his sister-in-law, Jay [facility], were encouraged to ask questions that were answered to their stated satisfaction.  At the conclusion of our conversation, the patient is interested in proceeding with the recommended 7-1/2-week course of daily salvage radiotherapy to the prostate fossa and pelvic lymph nodes, concurrent with ST-ADT.  He and his sister-in-law, Jay [facility], appear to have a good understanding of  his disease and our treatment recommendations which are of curative intent.  He is eager to start the ADT now but prefers to postpone starting the daily radiation treatments until after they return from a planned family visit in [place], [place] for Thanksgiving.  We will share our discussion with Dr. Rosana Bailey and help to coordinate for a follow-up visit to start ADT, first available.  We will proceed with treatment planning with CT simulation in early December 2021, in anticipation of beginning his daily treatments shortly thereafter.  We enjoyed meeting with him and his sister-in-law again today and look forward to continuing to participate in his care.    Nicholos Johns, PA-C    Tyler Pita, MD  [facility] Direct Dial: [phone]  Fax: [phone] [email or web address]  Skype  [email or web address]   This document serves as a record of services personally performed by Tyler Pita, MD and Freeman Caldron, PA-C. It was created on their behalf by Wilburn Mylar, a trained medical scribe. The creation of this record is based on the scribe's personal observations and the provider's statements to them. This document has been checked and approved by the attending provider.

## 2020-10-04 ENCOUNTER — Ambulatory Visit: Payer: Medicare Other | Admitting: Podiatry

## 2020-10-04 ENCOUNTER — Telehealth: Payer: Self-pay | Admitting: *Deleted

## 2020-10-04 NOTE — Telephone Encounter (Signed)
CALLED PATIENT'S SISTER [place] TO INFORM OF ADT APPT. WITH DR. DAVIS ON 10-24-20 - ARRIVAL TIME- 3:15 PM @ DR. DAVIS' NO ANSWER , UNABLE TO [place]

## 2020-10-12 ENCOUNTER — Telehealth: Payer: Self-pay | Admitting: *Deleted

## 2020-10-12 NOTE — Telephone Encounter (Signed)
RETURNED PATIENT'S SISTER ([place] [place] PHONE CALL), SPOKE WITH MS. Para March

## 2020-10-12 NOTE — Telephone Encounter (Signed)
PATIENT'S SISTER- SHELLY Round STATED THAT HER BROTHER WILL HAVE HIS ADT ON 10-13-20 @ DR. DAVIS' OFFICE

## 2020-10-13 DIAGNOSIS — C61 Malignant neoplasm of prostate: Secondary | ICD-10-CM | POA: Diagnosis not present

## 2020-10-20 ENCOUNTER — Telehealth: Payer: Self-pay | Admitting: *Deleted

## 2020-10-20 NOTE — Telephone Encounter (Signed)
CALLED PATIENT TO REMIND OF SIM APPT. FOR 10-21-20 @ 3 PM, UNABLE TO LEAVE MESSAGE, NO VOICE MAIL

## 2020-10-21 ENCOUNTER — Telehealth: Payer: Self-pay | Admitting: *Deleted

## 2020-10-21 ENCOUNTER — Ambulatory Visit
Admission: RE | Admit: 2020-10-21 | Discharge: 2020-10-21 | Disposition: A | Discharge status: Home / Self Care | Payer: Medicare Other | Source: Ambulatory Visit | Attending: Radiation Oncology | Admitting: Radiation Oncology

## 2020-10-21 ENCOUNTER — Other Ambulatory Visit: Payer: Self-pay

## 2020-10-21 DIAGNOSIS — C61 Malignant neoplasm of prostate: Secondary | ICD-10-CM | POA: Diagnosis not present

## 2020-10-21 NOTE — Progress Notes (Signed)
  Radiation Oncology         [phone]) [phone] ________________________________  Name: OGDEN HANDLIN MRN: [id number]  Date: 10/21/2020  DOB: [birth year]  SIMULATION AND TREATMENT PLANNING NOTE    ICD-10-CM   1. Malignant neoplasm of prostate ([place])  C61     DIAGNOSIS:  79 y.o. gentleman with biochemical recurrence of prostate cancer with PSA of 12.39 s/p RRP in 01/2004 for Stage pT2a, pN0 Gleason 3+4 prostate cancer.  NARRATIVE:  The patient was brought to the [facility].  Identity was confirmed.  All relevant records and images related to the planned course of therapy were reviewed.  The patient freely provided informed written consent to proceed with treatment after reviewing the details related to the planned course of therapy. The consent form was witnessed and verified by the simulation staff.  Then, the patient was set-up in a stable reproducible supine position for radiation therapy.  A vacuum lock pillow device was custom fabricated to position his legs in a reproducible immobilized position.  Then, I performed a urethrogram under sterile conditions to identify the prostatic bed.  CT images were obtained.  Surface markings were placed.  The CT images were loaded into the planning software.  Then the prostate bed target, pelvic lymph node target and avoidance structures including the rectum, bladder, bowel and hips were contoured.  Treatment planning then occurred.  The radiation prescription was entered and confirmed.  A total of one complex treatment devices were fabricated. I have requested : Intensity Modulated Radiotherapy (IMRT) is medically necessary for this case for the following reason:  Rectal sparing.Marland Kitchen  PLAN:  The patient will receive 45 Gy in 25 fractions of 1.8 Gy, followed by a boost to the prostate bed to a total dose of 68.4 Gy with 13 additional fractions of 1.8 Gy.   ________________________________  Sheral Apley Tammi Klippel, M.D.  This document serves as a  record of services personally performed by Tyler Pita, MD. It was created on his behalf by Wilburn Mylar, a trained medical scribe. The creation of this record is based on the scribe's personal observations and the provider's statements to them. This document has been checked and approved by the attending provider.

## 2020-10-21 NOTE — Telephone Encounter (Signed)
Called patient to inform to be here no later than 2:30 pm today, unable to leave message, phone not working

## 2020-10-21 NOTE — Telephone Encounter (Signed)
CALLED PATIENT TO INFORM THAT HE NEEDS TO ARRIVE @ 2:15 PM @ [facility], SPOKE WITH PATIENT AND HE IS AWARE OF THIS APPT. BEING MOVED UP

## 2020-10-25 ENCOUNTER — Ambulatory Visit: Payer: Medicare Other | Admitting: Radiation Oncology

## 2020-10-25 DIAGNOSIS — C61 Malignant neoplasm of prostate: Secondary | ICD-10-CM | POA: Diagnosis not present

## 2020-11-02 ENCOUNTER — Ambulatory Visit
Admission: RE | Admit: 2020-11-02 | Discharge: 2020-11-02 | Disposition: A | Discharge status: Home / Self Care | Payer: Medicare Other | Source: Ambulatory Visit | Attending: Radiation Oncology | Admitting: Radiation Oncology

## 2020-11-02 DIAGNOSIS — C61 Malignant neoplasm of prostate: Secondary | ICD-10-CM | POA: Insufficient documentation

## 2020-11-03 ENCOUNTER — Ambulatory Visit
Admission: RE | Admit: 2020-11-03 | Discharge: 2020-11-03 | Disposition: A | Discharge status: Home / Self Care | Payer: Medicare Other | Source: Ambulatory Visit | Attending: Radiation Oncology | Admitting: Radiation Oncology

## 2020-11-03 DIAGNOSIS — C61 Malignant neoplasm of prostate: Secondary | ICD-10-CM | POA: Diagnosis not present

## 2020-11-04 ENCOUNTER — Other Ambulatory Visit: Payer: Self-pay

## 2020-11-04 ENCOUNTER — Telehealth: Payer: Self-pay | Admitting: Radiation Oncology

## 2020-11-04 ENCOUNTER — Ambulatory Visit
Admission: RE | Admit: 2020-11-04 | Discharge: 2020-11-04 | Disposition: A | Discharge status: Home / Self Care | Payer: Medicare Other | Source: Ambulatory Visit | Attending: Radiation Oncology | Admitting: Radiation Oncology

## 2020-11-04 DIAGNOSIS — C61 Malignant neoplasm of prostate: Secondary | ICD-10-CM | POA: Diagnosis not present

## 2020-11-04 NOTE — Telephone Encounter (Signed)
Received voicemail message from patient requesting return call. Phoned back to inquire. Patient questions if he should still go for his ultrasound appointment at [facility] to evaluate his thyroid even though he is getting radiation. Encouraged patient to present for ultrasound as planned. Explained an ultrasound of the thyroid will not interfere with his prostate radiation. Patient verbalized understanding.

## 2020-11-07 ENCOUNTER — Ambulatory Visit
Admission: RE | Admit: 2020-11-07 | Discharge: 2020-11-07 | Disposition: A | Discharge status: Home / Self Care | Payer: Medicare Other | Source: Ambulatory Visit | Attending: Radiation Oncology | Admitting: Radiation Oncology

## 2020-11-07 ENCOUNTER — Other Ambulatory Visit: Payer: Self-pay

## 2020-11-07 DIAGNOSIS — C61 Malignant neoplasm of prostate: Secondary | ICD-10-CM | POA: Diagnosis not present

## 2020-11-08 ENCOUNTER — Ambulatory Visit
Admission: RE | Admit: 2020-11-08 | Discharge: 2020-11-08 | Disposition: A | Discharge status: Home / Self Care | Payer: Medicare Other | Source: Ambulatory Visit | Attending: Radiation Oncology | Admitting: Radiation Oncology

## 2020-11-08 DIAGNOSIS — C61 Malignant neoplasm of prostate: Secondary | ICD-10-CM | POA: Diagnosis not present

## 2020-11-09 ENCOUNTER — Ambulatory Visit (INDEPENDENT_AMBULATORY_CARE_PROVIDER_SITE_OTHER): Payer: Medicare Other | Admitting: Podiatry

## 2020-11-09 ENCOUNTER — Other Ambulatory Visit: Payer: Self-pay

## 2020-11-09 ENCOUNTER — Encounter: Payer: Self-pay | Admitting: Podiatry

## 2020-11-09 ENCOUNTER — Ambulatory Visit
Admission: RE | Admit: 2020-11-09 | Discharge: 2020-11-09 | Disposition: A | Discharge status: Home / Self Care | Payer: Medicare Other | Source: Ambulatory Visit | Attending: Radiation Oncology | Admitting: Radiation Oncology

## 2020-11-09 DIAGNOSIS — B351 Tinea unguium: Secondary | ICD-10-CM

## 2020-11-09 DIAGNOSIS — C61 Malignant neoplasm of prostate: Secondary | ICD-10-CM | POA: Diagnosis not present

## 2020-11-09 DIAGNOSIS — E119 Type 2 diabetes mellitus without complications: Secondary | ICD-10-CM

## 2020-11-09 DIAGNOSIS — M79676 Pain in unspecified toe(s): Secondary | ICD-10-CM

## 2020-11-09 NOTE — Progress Notes (Signed)
This patient returns to my office for at risk foot care.  This patient requires this care by a professional since this patient will be at risk due to having  Diabetes.   This patient is unable to cut nails himself since the patient cannot reach his nails.These nails are painful walking and wearing shoes.  This patient presents for at risk foot care today.  General Appearance  Alert, conversant and in no acute stress.  Vascular  Dorsalis pedis and posterior tibial  pulses are weakly  palpable  bilaterally.  Capillary return is within normal limits  bilaterally. Temperature is within normal limits  bilaterally.  Neurologic  Senn-Weinstein monofilament wire test within normal limits  bilaterally. Muscle power within normal limits bilaterally.  Nails Thick disfigured discolored nails with subungual debris  from hallux to fifth toes bilaterally. No evidence of bacterial infection or drainage bilaterally.  Orthopedic  No limitations of motion  feet .  No crepitus or effusions noted.  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  Prominent metatarsal heads  B/L.  Skin  normotropic skin with no porokeratosis noted bilaterally.  No signs of infections or ulcers noted.     Onychomycosis  Pain in right toes  Pain in left toes  Consent was obtained for treatment procedures.   Mechanical debridement of nails 1-5  bilaterally performed with a nail nipper.  Filed with dremel without incident.    Return office visit    10 weeks                  Told patient to return for periodic foot care and evaluation due to potential at risk complications.   Gardiner Barefoot DPM

## 2020-11-10 ENCOUNTER — Other Ambulatory Visit: Payer: Self-pay

## 2020-11-10 ENCOUNTER — Ambulatory Visit
Admission: RE | Admit: 2020-11-10 | Discharge: 2020-11-10 | Disposition: A | Discharge status: Home / Self Care | Payer: Medicare Other | Source: Ambulatory Visit | Attending: Radiation Oncology | Admitting: Radiation Oncology

## 2020-11-10 DIAGNOSIS — C61 Malignant neoplasm of prostate: Secondary | ICD-10-CM | POA: Diagnosis not present

## 2020-11-11 ENCOUNTER — Ambulatory Visit
Admission: RE | Admit: 2020-11-11 | Discharge: 2020-11-11 | Disposition: A | Discharge status: Home / Self Care | Payer: Medicare Other | Source: Ambulatory Visit | Attending: Radiation Oncology | Admitting: Radiation Oncology

## 2020-11-11 DIAGNOSIS — C61 Malignant neoplasm of prostate: Secondary | ICD-10-CM | POA: Diagnosis not present

## 2020-11-14 ENCOUNTER — Ambulatory Visit
Admission: RE | Admit: 2020-11-14 | Discharge: 2020-11-14 | Disposition: A | Discharge status: Home / Self Care | Payer: Medicare Other | Source: Ambulatory Visit | Attending: Radiation Oncology | Admitting: Radiation Oncology

## 2020-11-14 DIAGNOSIS — C61 Malignant neoplasm of prostate: Secondary | ICD-10-CM | POA: Diagnosis not present

## 2020-11-14 DIAGNOSIS — C4331 Malignant melanoma of nose: Secondary | ICD-10-CM | POA: Diagnosis not present

## 2020-11-15 ENCOUNTER — Ambulatory Visit
Admission: RE | Admit: 2020-11-15 | Discharge: 2020-11-15 | Disposition: A | Discharge status: Home / Self Care | Payer: Medicare Other | Source: Ambulatory Visit | Attending: Radiation Oncology | Admitting: Radiation Oncology

## 2020-11-15 DIAGNOSIS — C61 Malignant neoplasm of prostate: Secondary | ICD-10-CM | POA: Diagnosis not present

## 2020-11-16 ENCOUNTER — Other Ambulatory Visit: Payer: Self-pay | Admitting: Radiation Oncology

## 2020-11-16 ENCOUNTER — Ambulatory Visit
Admission: RE | Admit: 2020-11-16 | Discharge: 2020-11-16 | Disposition: A | Discharge status: Home / Self Care | Payer: Medicare Other | Source: Ambulatory Visit | Attending: Radiation Oncology | Admitting: Radiation Oncology

## 2020-11-16 ENCOUNTER — Other Ambulatory Visit: Payer: Self-pay

## 2020-11-16 DIAGNOSIS — C61 Malignant neoplasm of prostate: Secondary | ICD-10-CM | POA: Diagnosis not present

## 2020-11-16 MED ORDER — PROCHLORPERAZINE MALEATE 10 MG PO TABS: 10.0000 mg | ORAL_TABLET | Freq: Four times a day (QID) | ORAL | 0 refills | Status: DC | PRN

## 2020-11-17 ENCOUNTER — Ambulatory Visit
Admission: RE | Admit: 2020-11-17 | Discharge: 2020-11-17 | Disposition: A | Discharge status: Home / Self Care | Payer: Medicare Other | Source: Ambulatory Visit | Attending: Radiation Oncology | Admitting: Radiation Oncology

## 2020-11-17 DIAGNOSIS — C61 Malignant neoplasm of prostate: Secondary | ICD-10-CM | POA: Diagnosis not present

## 2020-11-18 ENCOUNTER — Ambulatory Visit
Admission: RE | Admit: 2020-11-18 | Discharge: 2020-11-18 | Disposition: A | Discharge status: Home / Self Care | Payer: Medicare Other | Source: Ambulatory Visit | Attending: Radiation Oncology | Admitting: Radiation Oncology

## 2020-11-18 DIAGNOSIS — C61 Malignant neoplasm of prostate: Secondary | ICD-10-CM | POA: Diagnosis not present

## 2020-11-21 ENCOUNTER — Ambulatory Visit
Admission: RE | Admit: 2020-11-21 | Discharge: 2020-11-21 | Disposition: A | Discharge status: Home / Self Care | Payer: Medicare Other | Source: Ambulatory Visit | Attending: Radiation Oncology | Admitting: Radiation Oncology

## 2020-11-21 DIAGNOSIS — C61 Malignant neoplasm of prostate: Secondary | ICD-10-CM | POA: Diagnosis not present

## 2020-11-22 ENCOUNTER — Ambulatory Visit
Admission: RE | Admit: 2020-11-22 | Discharge: 2020-11-22 | Disposition: A | Discharge status: Home / Self Care | Payer: Medicare Other | Source: Ambulatory Visit | Attending: Radiation Oncology | Admitting: Radiation Oncology

## 2020-11-22 DIAGNOSIS — C61 Malignant neoplasm of prostate: Secondary | ICD-10-CM | POA: Diagnosis not present

## 2020-11-23 ENCOUNTER — Ambulatory Visit
Admission: RE | Admit: 2020-11-23 | Discharge: 2020-11-23 | Disposition: A | Discharge status: Home / Self Care | Payer: Medicare Other | Source: Ambulatory Visit | Attending: Radiation Oncology | Admitting: Radiation Oncology

## 2020-11-23 ENCOUNTER — Telehealth: Payer: Self-pay | Admitting: Radiation Oncology

## 2020-11-23 DIAGNOSIS — C61 Malignant neoplasm of prostate: Secondary | ICD-10-CM | POA: Diagnosis not present

## 2020-11-23 NOTE — Telephone Encounter (Signed)
Phoned patient at home and on his cell to propose Gas X as requested by Dr. Lisbeth Renshaw and Tammi Klippel. No answer and no option to leave a message at either number.

## 2020-11-24 ENCOUNTER — Ambulatory Visit
Admission: RE | Admit: 2020-11-24 | Discharge: 2020-11-24 | Disposition: A | Discharge status: Home / Self Care | Payer: Medicare Other | Source: Ambulatory Visit | Attending: Radiation Oncology | Admitting: Radiation Oncology

## 2020-11-24 DIAGNOSIS — Z20822 Contact with and (suspected) exposure to covid-19: Secondary | ICD-10-CM | POA: Diagnosis not present

## 2020-11-24 DIAGNOSIS — I10 Essential (primary) hypertension: Secondary | ICD-10-CM | POA: Diagnosis not present

## 2020-11-24 DIAGNOSIS — K56609 Unspecified intestinal obstruction, unspecified as to partial versus complete obstruction: Secondary | ICD-10-CM | POA: Diagnosis not present

## 2020-11-24 DIAGNOSIS — C61 Malignant neoplasm of prostate: Secondary | ICD-10-CM | POA: Diagnosis not present

## 2020-11-24 DIAGNOSIS — K520 Gastroenteritis and colitis due to radiation: Secondary | ICD-10-CM | POA: Diagnosis not present

## 2020-11-24 DIAGNOSIS — G809 Cerebral palsy, unspecified: Secondary | ICD-10-CM | POA: Diagnosis not present

## 2020-11-24 DIAGNOSIS — K567 Ileus, unspecified: Secondary | ICD-10-CM | POA: Diagnosis not present

## 2020-11-24 DIAGNOSIS — R197 Diarrhea, unspecified: Secondary | ICD-10-CM | POA: Diagnosis not present

## 2020-11-24 DIAGNOSIS — K50012 Crohn's disease of small intestine with intestinal obstruction: Secondary | ICD-10-CM | POA: Diagnosis not present

## 2020-11-24 DIAGNOSIS — K529 Noninfective gastroenteritis and colitis, unspecified: Secondary | ICD-10-CM | POA: Diagnosis not present

## 2020-11-24 DIAGNOSIS — Z66 Do not resuscitate: Secondary | ICD-10-CM | POA: Diagnosis not present

## 2020-11-24 DIAGNOSIS — R000 Tachycardia, unspecified: Secondary | ICD-10-CM | POA: Diagnosis not present

## 2020-11-27 ENCOUNTER — Other Ambulatory Visit: Payer: Self-pay

## 2020-11-27 ENCOUNTER — Emergency Department (HOSPITAL_COMMUNITY): Payer: Medicare Other

## 2020-11-27 ENCOUNTER — Encounter (HOSPITAL_COMMUNITY): Payer: Self-pay

## 2020-11-27 ENCOUNTER — Inpatient Hospital Stay (HOSPITAL_COMMUNITY)
Admission: EM | Admit: 2020-11-27 | Discharge: 2020-12-02 | DRG: 386 | Disposition: A | Discharge status: Home Health | Payer: Medicare Other | Attending: Internal Medicine | Admitting: Internal Medicine

## 2020-11-27 DIAGNOSIS — Z801 Family history of malignant neoplasm of trachea, bronchus and lung: Secondary | ICD-10-CM

## 2020-11-27 DIAGNOSIS — E1169 Type 2 diabetes mellitus with other specified complication: Secondary | ICD-10-CM | POA: Diagnosis present

## 2020-11-27 DIAGNOSIS — K56600 Partial intestinal obstruction, unspecified as to cause: Secondary | ICD-10-CM | POA: Diagnosis present

## 2020-11-27 DIAGNOSIS — F419 Anxiety disorder, unspecified: Secondary | ICD-10-CM | POA: Diagnosis present

## 2020-11-27 DIAGNOSIS — M7750 Other enthesopathy of unspecified foot: Secondary | ICD-10-CM | POA: Diagnosis present

## 2020-11-27 DIAGNOSIS — Z85820 Personal history of malignant melanoma of skin: Secondary | ICD-10-CM | POA: Diagnosis not present

## 2020-11-27 DIAGNOSIS — R972 Elevated prostate specific antigen [PSA]: Secondary | ICD-10-CM | POA: Diagnosis present

## 2020-11-27 DIAGNOSIS — E876 Hypokalemia: Secondary | ICD-10-CM | POA: Diagnosis present

## 2020-11-27 DIAGNOSIS — G809 Cerebral palsy, unspecified: Secondary | ICD-10-CM | POA: Diagnosis present

## 2020-11-27 DIAGNOSIS — R32 Unspecified urinary incontinence: Secondary | ICD-10-CM | POA: Diagnosis present

## 2020-11-27 DIAGNOSIS — Z8249 Family history of ischemic heart disease and other diseases of the circulatory system: Secondary | ICD-10-CM

## 2020-11-27 DIAGNOSIS — Z4682 Encounter for fitting and adjustment of non-vascular catheter: Secondary | ICD-10-CM | POA: Diagnosis not present

## 2020-11-27 DIAGNOSIS — R112 Nausea with vomiting, unspecified: Secondary | ICD-10-CM

## 2020-11-27 DIAGNOSIS — K56609 Unspecified intestinal obstruction, unspecified as to partial versus complete obstruction: Secondary | ICD-10-CM

## 2020-11-27 DIAGNOSIS — Z20822 Contact with and (suspected) exposure to covid-19: Secondary | ICD-10-CM | POA: Diagnosis present

## 2020-11-27 DIAGNOSIS — Z66 Do not resuscitate: Secondary | ICD-10-CM | POA: Diagnosis present

## 2020-11-27 DIAGNOSIS — R197 Diarrhea, unspecified: Secondary | ICD-10-CM | POA: Diagnosis not present

## 2020-11-27 DIAGNOSIS — Z9221 Personal history of antineoplastic chemotherapy: Secondary | ICD-10-CM | POA: Diagnosis not present

## 2020-11-27 DIAGNOSIS — Z96643 Presence of artificial hip joint, bilateral: Secondary | ICD-10-CM | POA: Diagnosis present

## 2020-11-27 DIAGNOSIS — R000 Tachycardia, unspecified: Secondary | ICD-10-CM | POA: Diagnosis not present

## 2020-11-27 DIAGNOSIS — Z974 Presence of external hearing-aid: Secondary | ICD-10-CM

## 2020-11-27 DIAGNOSIS — H9190 Unspecified hearing loss, unspecified ear: Secondary | ICD-10-CM | POA: Diagnosis present

## 2020-11-27 DIAGNOSIS — K50012 Crohn's disease of small intestine with intestinal obstruction: Principal | ICD-10-CM | POA: Diagnosis present

## 2020-11-27 DIAGNOSIS — K529 Noninfective gastroenteritis and colitis, unspecified: Secondary | ICD-10-CM | POA: Diagnosis not present

## 2020-11-27 DIAGNOSIS — K5669 Other partial intestinal obstruction: Secondary | ICD-10-CM | POA: Diagnosis not present

## 2020-11-27 DIAGNOSIS — K520 Gastroenteritis and colitis due to radiation: Secondary | ICD-10-CM | POA: Diagnosis present

## 2020-11-27 DIAGNOSIS — F32A Depression, unspecified: Secondary | ICD-10-CM | POA: Diagnosis present

## 2020-11-27 DIAGNOSIS — I493 Ventricular premature depolarization: Secondary | ICD-10-CM | POA: Diagnosis present

## 2020-11-27 DIAGNOSIS — K6389 Other specified diseases of intestine: Secondary | ICD-10-CM | POA: Diagnosis not present

## 2020-11-27 DIAGNOSIS — Z888 Allergy status to other drugs, medicaments and biological substances status: Secondary | ICD-10-CM

## 2020-11-27 DIAGNOSIS — I10 Essential (primary) hypertension: Secondary | ICD-10-CM | POA: Diagnosis present

## 2020-11-27 DIAGNOSIS — E785 Hyperlipidemia, unspecified: Secondary | ICD-10-CM | POA: Diagnosis present

## 2020-11-27 DIAGNOSIS — Z7984 Long term (current) use of oral hypoglycemic drugs: Secondary | ICD-10-CM

## 2020-11-27 DIAGNOSIS — Z800 Family history of malignant neoplasm of digestive organs: Secondary | ICD-10-CM | POA: Diagnosis not present

## 2020-11-27 DIAGNOSIS — Z79899 Other long term (current) drug therapy: Secondary | ICD-10-CM

## 2020-11-27 DIAGNOSIS — C61 Malignant neoplasm of prostate: Secondary | ICD-10-CM | POA: Diagnosis present

## 2020-11-27 DIAGNOSIS — Z8051 Family history of malignant neoplasm of kidney: Secondary | ICD-10-CM

## 2020-11-27 DIAGNOSIS — R0682 Tachypnea, not elsewhere classified: Secondary | ICD-10-CM | POA: Diagnosis present

## 2020-11-27 DIAGNOSIS — E1165 Type 2 diabetes mellitus with hyperglycemia: Secondary | ICD-10-CM | POA: Diagnosis present

## 2020-11-27 DIAGNOSIS — K567 Ileus, unspecified: Secondary | ICD-10-CM | POA: Diagnosis not present

## 2020-11-27 LAB — URINALYSIS, ROUTINE W REFLEX MICROSCOPIC
Bilirubin Urine: NEGATIVE
Glucose, UA: NEGATIVE mg/dL
Ketones, ur: 5 mg/dL — AB
Leukocytes,Ua: NEGATIVE
Nitrite: NEGATIVE
Protein, ur: 100 mg/dL — AB
Specific Gravity, Urine: 1.026 (ref 1.005–1.030)
pH: 5 (ref 5.0–8.0)

## 2020-11-27 LAB — CBC WITH DIFFERENTIAL/PLATELET
Abs Immature Granulocytes: 0.02 10*3/uL (ref 0.00–0.07)
Basophils Absolute: 0.1 10*3/uL (ref 0.0–0.1)
Basophils Relative: 2 %
Eosinophils Absolute: 0.1 10*3/uL (ref 0.0–0.5)
Eosinophils Relative: 2 %
HCT: 44.9 % (ref 39.0–52.0)
Hemoglobin: 15.4 g/dL (ref 13.0–17.0)
Immature Granulocytes: 1 %
Lymphocytes Relative: 4 %
Lymphs Abs: 0.1 10*3/uL — ABNORMAL LOW (ref 0.7–4.0)
MCH: 32.7 pg (ref 26.0–34.0)
MCHC: 34.3 g/dL (ref 30.0–36.0)
MCV: 95.3 fL (ref 80.0–100.0)
Monocytes Absolute: 0.5 10*3/uL (ref 0.1–1.0)
Monocytes Relative: 15 %
Neutro Abs: 2.4 10*3/uL (ref 1.7–7.7)
Neutrophils Relative %: 76 %
Platelets: 160 10*3/uL (ref 150–400)
RBC: 4.71 MIL/uL (ref 4.22–5.81)
RDW: 13 % (ref 11.5–15.5)
WBC: 3.1 10*3/uL — ABNORMAL LOW (ref 4.0–10.5)
nRBC: 0 % (ref 0.0–0.2)

## 2020-11-27 LAB — COMPREHENSIVE METABOLIC PANEL
ALT: 37 U/L (ref 0–44)
AST: 18 U/L (ref 15–41)
Albumin: 4.1 g/dL (ref 3.5–5.0)
Alkaline Phosphatase: 67 U/L (ref 38–126)
Anion gap: 13 (ref 5–15)
BUN: 20 mg/dL (ref 8–23)
CO2: 23 mmol/L (ref 22–32)
Calcium: 9.1 mg/dL (ref 8.9–10.3)
Chloride: 100 mmol/L (ref 98–111)
Creatinine, Ser: 1.1 mg/dL (ref 0.61–1.24)
GFR, Estimated: >60 mL/min (ref >60)
Glucose, Bld: 183 mg/dL — ABNORMAL HIGH (ref 70–99)
Potassium: 3.4 mmol/L — ABNORMAL LOW (ref 3.5–5.1)
Sodium: 136 mmol/L (ref 135–145)
Total Bilirubin: 1 mg/dL (ref 0.3–1.2)
Total Protein: 7.6 g/dL (ref 6.5–8.1)

## 2020-11-27 LAB — C DIFFICILE QUICK SCREEN W PCR REFLEX
C Diff antigen: NEGATIVE
C Diff interpretation: NOT DETECTED
C Diff toxin: NEGATIVE

## 2020-11-27 LAB — CBG MONITORING, ED: Glucose-Capillary: 163 mg/dL — ABNORMAL HIGH (ref 70–99)

## 2020-11-27 LAB — LACTIC ACID, PLASMA
Lactic Acid, Venous: 1.2 mmol/L (ref 0.5–1.9)
Lactic Acid, Venous: 1.6 mmol/L (ref 0.5–1.9)

## 2020-11-27 LAB — RESP PANEL BY RT-PCR (FLU A&B, COVID) ARPGX2
Influenza A by PCR: NEGATIVE
Influenza B by PCR: NEGATIVE
SARS Coronavirus 2 by RT PCR: NEGATIVE

## 2020-11-27 LAB — LIPASE, BLOOD: Lipase: 18 U/L (ref 11–51)

## 2020-11-27 MED ORDER — METOPROLOL TARTRATE 5 MG/5ML IV SOLN: 5.0000 mg | Freq: Four times a day (QID) | INTRAVENOUS | Status: DC | PRN

## 2020-11-27 MED ORDER — SODIUM CHLORIDE 0.9 % IV BOLUS
500.0000 mL | Freq: Once | INTRAVENOUS | Status: DC
  Administered 2020-11-27: 20:00:00 500 mL via INTRAVENOUS

## 2020-11-27 MED ORDER — LORAZEPAM 2 MG/ML IJ SOLN
0.5000 mg | INTRAMUSCULAR | Status: DC | PRN
  Administered 2020-11-27 – 2020-11-30 (×5): 0.5 mg via INTRAVENOUS
  Filled 2020-11-27 (×5): qty 1

## 2020-11-27 MED ORDER — LACTATED RINGERS IV SOLN: INTRAVENOUS | Status: DC

## 2020-11-27 MED ORDER — ENOXAPARIN SODIUM 40 MG/0.4ML ~~LOC~~ SOLN
40.0000 mg | SUBCUTANEOUS | Status: DC
  Administered 2020-11-30: 23:00:00 40 mg via SUBCUTANEOUS
  Filled 2020-11-27 (×4): qty 0.4

## 2020-11-27 MED ORDER — SODIUM CHLORIDE 0.9 % IV BOLUS
1000.0000 mL | Freq: Once | INTRAVENOUS | Status: AC
  Administered 2020-11-27: 14:00:00 1000 mL via INTRAVENOUS

## 2020-11-27 MED ORDER — SODIUM CHLORIDE 0.9 % IV SOLN
3.0000 g | Freq: Four times a day (QID) | INTRAVENOUS | Status: DC
  Administered 2020-11-28 – 2020-12-01 (×12): 3 g via INTRAVENOUS
  Filled 2020-11-27 (×3): qty 8
  Filled 2020-11-27: qty 3
  Filled 2020-11-27: qty 0.19
  Filled 2020-11-27: qty 3
  Filled 2020-11-27: qty 0.19
  Filled 2020-11-27 (×5): qty 3
  Filled 2020-11-27 (×3): qty 8

## 2020-11-27 MED ORDER — DEXTROSE IN LACTATED RINGERS 5 % IV SOLN: INTRAVENOUS | Status: DC

## 2020-11-27 MED ORDER — ACETAMINOPHEN 650 MG RE SUPP: 650.0000 mg | Freq: Four times a day (QID) | RECTAL | Status: DC | PRN

## 2020-11-27 MED ORDER — POTASSIUM CHLORIDE 10 MEQ/100ML IV SOLN
10.0000 meq | INTRAVENOUS | Status: AC
  Administered 2020-11-27 – 2020-11-28 (×5): 10 meq via INTRAVENOUS
  Filled 2020-11-27 (×5): qty 100

## 2020-11-27 MED ORDER — METRONIDAZOLE IN NACL 5-0.79 MG/ML-% IV SOLN: 500.0000 mg | Freq: Three times a day (TID) | INTRAVENOUS | Status: DC

## 2020-11-27 MED ORDER — IOHEXOL 300 MG/ML  SOLN
100.0000 mL | Freq: Once | INTRAMUSCULAR | Status: AC | PRN
  Administered 2020-11-27: 16:00:00 100 mL via INTRAVENOUS

## 2020-11-27 MED ORDER — ONDANSETRON HCL 4 MG/2ML IJ SOLN
4.0000 mg | Freq: Four times a day (QID) | INTRAMUSCULAR | Status: DC | PRN
  Administered 2020-11-27 – 2020-11-30 (×3): 4 mg via INTRAVENOUS
  Filled 2020-11-27 (×3): qty 2

## 2020-11-27 MED ORDER — METHOCARBAMOL 1000 MG/10ML IJ SOLN
500.0000 mg | Freq: Four times a day (QID) | INTRAVENOUS | Status: DC | PRN
  Filled 2020-11-27: qty 5

## 2020-11-27 MED ORDER — SODIUM CHLORIDE 0.9 % IV SOLN
3.0000 g | INTRAVENOUS | Status: AC
  Administered 2020-11-27: 21:00:00 3 g via INTRAVENOUS
  Filled 2020-11-27: qty 8

## 2020-11-27 MED ORDER — MORPHINE SULFATE (PF) 2 MG/ML IV SOLN
2.0000 mg | INTRAVENOUS | Status: DC | PRN
  Administered 2020-11-28: 2 mg via INTRAVENOUS
  Filled 2020-11-27: qty 1

## 2020-11-27 MED ORDER — HALOPERIDOL LACTATE 5 MG/ML IJ SOLN
1.0000 mg | Freq: Once | INTRAMUSCULAR | Status: AC
  Administered 2020-11-27: 16:00:00 1 mg via INTRAVENOUS
  Filled 2020-11-27: qty 1

## 2020-11-27 MED ORDER — INSULIN ASPART 100 UNIT/ML ~~LOC~~ SOLN
0.0000 [IU] | SUBCUTANEOUS | Status: DC
  Administered 2020-11-28 – 2020-12-01 (×3): 1 [IU] via SUBCUTANEOUS
  Filled 2020-11-27: qty 0.09

## 2020-11-27 MED ORDER — SODIUM CHLORIDE 0.9 % IV SOLN: 1.0000 g | INTRAVENOUS | Status: DC

## 2020-11-27 MED ORDER — ONDANSETRON HCL 4 MG/2ML IJ SOLN
4.0000 mg | Freq: Once | INTRAMUSCULAR | Status: AC
  Administered 2020-11-27: 14:00:00 4 mg via INTRAVENOUS
  Filled 2020-11-27: qty 2

## 2020-11-27 MED ORDER — ACETAMINOPHEN 325 MG PO TABS: 650.0000 mg | ORAL_TABLET | Freq: Four times a day (QID) | ORAL | Status: DC | PRN

## 2020-11-27 MED ORDER — ONDANSETRON HCL 4 MG PO TABS: 4.0000 mg | ORAL_TABLET | Freq: Four times a day (QID) | ORAL | Status: DC | PRN

## 2020-11-27 NOTE — ED Notes (Signed)
Pt reports "I cannot produce a bowel movement"

## 2020-11-27 NOTE — H&P (Addendum)
[facility] History and Physical   Patient: Jay Bailey [id number]   PCP: Harlan Stains, MD DOB: Apr 27, 1941   DOA: 11/27/2020   DOS: 11/27/2020   DOS: the patient was seen and examined on 11/27/2020  Patient coming from: The patient is coming from Home  Chief Complaint: 3 weeks of nausea and 2 days of loose watery diarrhea with abdominal pain and distention  HPI: Jay Bailey is a 79 y.o. male with Past medical history of HTN, HLD, cerebral palsy, depression, type II DM, melanoma SP nasal surgery, prostate cancer SP prostatectomy with recently increased PSA SP radiation. Patient presents with complaints of nausea and diarrhea. Patient has history of prostate cancer and had chemoradiation in the past. On follow-up his PSA was elevated recently and therefore patient was recommended to go through radiation therapy for 38 cycles. He started this on 11/02/2020.  He was informed that nausea and diarrhea as well as vomiting can be part of the side effect of this therapy therefore he was prescribed Zofran and Imodium to take as needed. Patient has been taking Imodium almost on a daily basis as well as Zofran on as needed basis. Around 2 weeks after initiation of the radiation patient started having complaints of nausea with dry heaving.  Occasionally patient also has vomiting without any blood.  No vomiting for last 1 week but continues to have persistent nausea which limits his oral intake. Since last tonight patient has nonbloody runny watery bowel movement 4 yesterday none today. Patient also reports abdominal pain and distention ongoing for last few days and reports taking some Gas-X for it. Patient discussed his condition with his urologist who recommended him to go to the ER for further work-up. At the time of my evaluation patient denies having any complaints of headache, dizziness, chest pain, shortness of breath, cough, fever, chills, problem with urination, pain or focal  deficit in the extremities. Patient reported that he needs to throw up multiple times during the interview but just produced spit at the end. Passing gas. Sister at bedside also has [place] POA.  ED Course: Presents with above complaint.  CT abdomen shows evidence of partial small bowel obstruction as well as possible enteritis at ileum.  General surgery was consulted.  Recommend conservative measures and medical admission as well as nasogastric tube placement with possible small bowel obstruction protocol tomorrow.  Review of Systems: as mentioned in the history of present illness.  All other systems reviewed and are negative.  Past Medical History:  Diagnosis Date  . 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 Arthritis    fingers and toes   . Bradycardia 02/10/2019  . Cancer ([place])   . Congenital deformity of ankle joint   . Depression    a long time ago   . Diabetes mellitus without complication ([place])    type 2  . Hyperlipidemia   . Hypertension   . Inguinal hernia   . Prostate cancer ([place])   . Skin cancer (melanoma) ([place])    historical   . Wears hearing aid    bialteral    Past Surgical History:  Procedure Laterality Date  . INGUINAL HERNIA REPAIR     right and left hernias  miltiple times with mesh placement   . INGUINAL HERNIA REPAIR Left 12/26/2018   Procedure: OPEN LEFT INGUINAL HERNIA WITH MESH;  Surgeon: Johnathan Hausen, MD;  Location: [facility];  Service: General;  Laterality: Left;  .  nose myeloma    . PROSTATECTOMY  2005  . TOTAL HIP ARTHROPLASTY  "long time ago "   bilateral ; [place]    Social History:  reports that he has never smoked. He has never used smokeless tobacco. He reports that he does not drink alcohol and does not use drugs.  Allergies  Allergen Reactions  . Lisinopril     Other reaction(s): cough    Family history reviewed and not pertinent Family History  Problem Relation Age of Onset  . Colon cancer Mother   . Kidney  cancer Mother   . Heart disease Father   . Lung cancer Brother   . Pancreatic cancer Neg Hx   . Prostate cancer Neg Hx   . Breast cancer Neg Hx      Prior to Admission medications   Medication Sig Start Date End Date Taking? Authorizing Provider  amLODipine (NORVASC) 2.5 MG tablet Take 2.5 mg by mouth daily.   Yes [provider]  ascorbic acid (VITAMIN C) 1000 MG tablet Take 1,000 mg by mouth daily.   Yes [provider]  atorvastatin (LIPITOR) 80 MG tablet Take 80 mg by mouth daily.   Yes [provider]  folic acid (FOLVITE) 263 MCG tablet Take 400 mcg by mouth daily.    Yes [provider]  irbesartan-hydrochlorothiazide (AVALIDE) 150-12.5 MG tablet Take 1 tablet by mouth daily. 08/31/20  Yes [provider]  loperamide (IMODIUM) 2 MG capsule Take 2 mg by mouth daily as needed for diarrhea or loose stools.   Yes [provider]  metFORMIN (GLUCOPHAGE-XR) 500 MG 24 hr tablet Take 500 mg by mouth daily with breakfast. 11/27/18  Yes [provider]  Multiple Vitamin (MULTIVITAMIN) tablet Take 1 tablet by mouth daily.   Yes [provider]  oxybutynin (DITROPAN-XL) 5 MG 24 hr tablet Take 1 tablet by mouth daily. 10/31/20  Yes [provider]  prochlorperazine (COMPAZINE) 10 MG tablet Take 1 tablet (10 mg total) by mouth every 6 (six) hours as needed for nausea or vomiting. 11/16/20  Yes Tyler Pita, MD  vitamin E 400 UNIT capsule Take 400 Units by mouth daily.   Yes [provider]  ACCU-CHEK GUIDE test strip  10/15/20   [provider]    Physical Exam: Vitals:   11/27/20 1530 11/27/20 1715 11/27/20 1900 11/27/20 1915  BP: (!) 143/72 133/70 120/80 123/76  Pulse: (!) 103 (!) 106 (!) 105 (!) 42  Resp: (!) 29 (!) 27 (!) 30 (!) 33  Temp:    98.2 F (36.8 C)  TempSrc:    Axillary  SpO2: 96% 95% 91% 97%  Weight:      Height:        General: alert and oriented to time, place, and  person. Appear in moderate distress, affect anxious Eyes: PERRL, Conjunctiva normal ENT: Oral Mucosa Clear, moist  Neck: no JVD, no Abnormal Mass Or lumps Cardiovascular: S1 and S2 Present, no Murmur, peripheral pulses symmetrical Respiratory: good respiratory effort, Bilateral Air entry equal and Decreased, no signs of accessory muscle use, Clear to Auscultation, no Crackles, no wheezes Abdomen: Bowel Sound present, Soft and distended, mild diffuse tenderness, no hernia Skin: no rashes  Extremities: no Pedal edema, no calf tenderness Neurologic: without any new focal findings Gait not checked due to patient safety concerns  Data Reviewed: I have personally reviewed and interpreted labs, imaging as discussed below.  CBC: Recent Labs  Lab 11/27/20 1343  WBC 3.1*  NEUTROABS  2.4  HGB 15.4  HCT 44.9  MCV 95.3  PLT 0000000   Basic Metabolic Panel: Recent Labs  Lab 11/27/20 1343  NA 136  K 3.4*  CL 100  CO2 23  GLUCOSE 183*  BUN 20  CREATININE 1.10  CALCIUM 9.1   GFR: Estimated Creatinine Clearance: 58.7 mL/min (by C-G formula based on SCr of 1.1 mg/dL). Liver Function Tests: Recent Labs  Lab 11/27/20 1343  AST 18  ALT 37  ALKPHOS 67  BILITOT 1.0  PROT 7.6  ALBUMIN 4.1   Recent Labs  Lab 11/27/20 1343  LIPASE 18   No results for input(s): AMMONIA in the last 168 hours. Coagulation Profile: No results for input(s): INR, PROTIME in the last 168 hours. Cardiac Enzymes: No results for input(s): CKTOTAL, CKMB, CKMBINDEX, TROPONINI in the last 168 hours. BNP (last 3 results) No results for input(s): PROBNP in the last 8760 hours. HbA1C: No results for input(s): HGBA1C in the last 72 hours. CBG: No results for input(s): GLUCAP in the last 168 hours. Lipid Profile: No results for input(s): CHOL, HDL, LDLCALC, TRIG, CHOLHDL, LDLDIRECT in the last 72 hours. Thyroid Function Tests: No results for input(s): TSH, T4TOTAL, FREET4, T3FREE, THYROIDAB in the last 72  hours. Anemia Panel: No results for input(s): VITAMINB12, FOLATE, FERRITIN, TIBC, IRON, RETICCTPCT in the last 72 hours. Urine analysis:    Component Value Date/Time   COLORURINE YELLOW 11/27/2020 1540   APPEARANCEUR HAZY (A) 11/27/2020 1540   LABSPEC 1.026 11/27/2020 1540   PHURINE 5.0 11/27/2020 1540   GLUCOSEU NEGATIVE 11/27/2020 1540   HGBUR SMALL (A) 11/27/2020 1540   BILIRUBINUR NEGATIVE 11/27/2020 1540   KETONESUR 5 (A) 11/27/2020 1540   PROTEINUR 100 (A) 11/27/2020 1540   UROBILINOGEN 0.2 06/17/2008 1000   NITRITE NEGATIVE 11/27/2020 1540   LEUKOCYTESUR NEGATIVE 11/27/2020 1540    Radiological Exams on Admission: CT ABDOMEN PELVIS W CONTRAST  Result Date: 11/27/2020 CLINICAL DATA:  Diarrhea.  Fatigue.  History of prostate cancer. EXAM: CT ABDOMEN AND PELVIS WITH CONTRAST TECHNIQUE: Multidetector CT imaging of the abdomen and pelvis was performed using the standard protocol following bolus administration of intravenous contrast. CONTRAST:  150mL OMNIPAQUE IOHEXOL 300 MG/ML  SOLN COMPARISON:  03/02/2020 FINDINGS: Lower chest: Lung bases are clear. Hepatobiliary: Liver, gallbladder and biliary tree are normal. Pancreas: Mild fatty atrophy of the pancreas. Spleen: Normal. Adrenals/Urinary Tract: Adrenal glands are normal. Kidneys are normal in size without hydronephrosis or nephrolithiasis. Several bilateral renal cysts present with the largest measuring 5.1 cm over the lower pole right kidney. Visualized ureters are normal as the distal ureters are obscured due to streak artifact from bilateral hip prostheses. Bladder is mostly obscured by the streak artifact from the hip prostheses. Stomach/Bowel: Stomach is normal. There multiple air and fluid-filled dilated small bowel loops measuring up to 4.6 cm in diameter. There appears to be gradual transition to normal caliber ileum as the ileum has intermittent areas of mild wall thickening to the level of the ileocecal valve. No free  peritoneal air or pneumatosis. Appendix is normal. Air and stool present throughout the colon. There is mild colonic diverticulosis. Vascular/Lymphatic: Minimal calcified plaque over the abdominal aorta which is normal in caliber. No adenopathy. Reproductive: Region of the prostate obscured by streak artifact from hip prostheses. Other: No significant free fluid. Multiple surgical clips over the anterior lower abdomen/pelvic wall likely due to previous hernia repair. Residual left inguinal hernia unchanged containing small amount of fluid and peritoneal fat. Surgical clips over  the left inguinal region. Musculoskeletal: Degenerative changes of the spine. Bilateral hip prostheses intact. IMPRESSION: 1. Multiple air and fluid-filled dilated small bowel loops measuring up to 4.6 cm in diameter with gradual transition to normal caliber ileum as the ileum has intermittent areas of mild wall thickening to the level of the ileocecal valve. Findings are likely due to ileus possibly secondary regional enteritis of infectious or inflammatory nature. Distal partial small bowel obstruction is possible. No free peritoneal air. 2. Bilateral renal cysts with the largest measuring 5.1 cm over the lower pole right kidney. 3. Mild colonic diverticulosis. 4. Aortic atherosclerosis. 5. Stable residual left inguinal hernia containing small amount of fluid and peritoneal fat. Aortic Atherosclerosis (ICD10-I70.0). Electronically Signed   By: Marin Olp M.D.   On: 11/27/2020 16:38   EKG: Independently reviewed. normal sinus rhythm, nonspecific ST and T waves changes, multiple PVCs, no QT prolongation.  I reviewed all nursing notes, pharmacy notes, vitals, pertinent old records.  Assessment/Plan 1.  Partial small bowel obstruction Possible terminal ileitis Diarrhea Nausea with dry heaves CT shows evidence of partial small bowel obstruction as well as possible terminal ileitis. For somebody who has ongoing nausea for last 3  weeks as well as diarrhea for last 2 days patient's only electrolyte abnormality is potassium of 3.4.  With normal renal function as well as no ketones in the urine.  Conservative measures recommended by general surgery. Gastric tube will be inserted. Low intermittent suction. Patient remains NPO. IV fluid with LR. We will replace potassium.  Goal K more than 4. As needed Zofran as needed morphine. Given potential for terminal ileitis as well as history of diarrhea we will check C. difficile and GI pathogen panel. Empiric therapy with IV Unasyn for now.  2.  Essential hypertension Blood pressure currently mildly elevated. As needed Lopressor. Holding Norvasc, losartan, HCTZ  3.  Type 2 diabetes mellitus, uncontrolled with HLD Holding Lipitor for now. Check hemoglobin A1c. On Metformin at home currently on hold. Sliding scale insulin every 4 hours insulin.  4.  History of prostate cancer. Urinary incontinence Patient is a Ditropan. We will currently hold Condom catheter.  5.  Cerebral palsy. Anxiety. Depression. Not on any medication at home. As needed Ativan.  6.  PVC on telemetry. Continue to monitor on telemetry. Patient is DNR. No prior history no chest pain no palpitation. Correct electrolyte abnormality.  7.  Tachypnea. Likely anxiety induced as well as abdominal distention induced. Anticipating improvement with decompression with NG tube. Monitor. No adventitious sound on examination. If persistent may require chest x-ray. CT shows lung bases clear.  8.  Melanoma. Patient has history of melanoma removal surgery from his nose certainly can pose a problem for placing NG tube. Will monitor. As needed Ativan.  Nutrition: NPO  DVT Prophylaxis: Subcutaneous Lovenox  Advance goals of care discussion: DNR discussed with sister at bedside who is also [place] POA.  Patient has a living will that mentions DNR.  Patient verbalized the same as well.  Consults: EDP  consulted general surgery.  Family Communication: family was present at bedside, at the time of interview.  Opportunity was given to ask question and all questions were answered satisfactorily.   Disposition:  From: Home Likely will need Home on discharge.   Author: Berle Mull, MD [facility] 11/27/2020 7:42 PM   To reach On-call, see care teams to locate the attending and reach out to them via [email or web address]. If 7PM-7AM, please contact night-coverage If you still have difficulty  reaching the attending provider, please page the [facility] (Director on Call) for [facility] on amion for assistance.

## 2020-11-27 NOTE — ED Triage Notes (Signed)
Pt came from home via EMS. Diarrhea and fatigue for 1.5 wks. Due to radiation tx, ocology doctor told pt that these side effects would most likely occur. No LOC, no dizziness, no hypotension. A&O x4, ambulatory. CBG: 185. PMH of T2DM. Pt just reports feeling fatigued.

## 2020-11-27 NOTE — Consult Note (Signed)
Reason for Consult:abd distension Referring Physician: Dr. Jeanie Cooks Jay Bailey is an 79 y.o. male.  HPI: The patient is a Jay Bailey year old white male who has a history of prostate cancer.  He started radiation therapy for this in 1 December.  He states that his GI tract has not been the same since the start of the radiation.  He has had abdominal distention.  He has had some nausea and vomiting.  He has had persistent significant diarrhea.  He denies any fevers or chills.  He came to the emergency department where a CT scan shows a possible partial small bowel obstruction from a thickened area of the distal small bowel.  Past Medical History:  Diagnosis Date  . 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 Arthritis    fingers and toes   . Bradycardia 02/10/2019  . Cancer ([place])   . Congenital deformity of ankle joint   . Depression    a long time ago   . Diabetes mellitus without complication ([place])    type 2  . Hyperlipidemia   . Hypertension   . Inguinal hernia   . Prostate cancer ([place])   . Skin cancer (melanoma) ([place])    historical   . Wears hearing aid    bialteral     Past Surgical History:  Procedure Laterality Date  . INGUINAL HERNIA REPAIR     right and left hernias  miltiple times with mesh placement   . INGUINAL HERNIA REPAIR Left 12/26/2018   Procedure: OPEN LEFT INGUINAL HERNIA WITH MESH;  Surgeon: Johnathan Hausen, MD;  Location: [facility];  Service: General;  Laterality: Left;  . nose myeloma    . PROSTATECTOMY  2005  . TOTAL HIP ARTHROPLASTY  "long time ago "   bilateral ; [place]     Family History  Problem Relation Age of Onset  . Colon cancer Mother   . Kidney cancer Mother   . Heart disease Father   . Lung cancer Brother   . Pancreatic cancer Neg Hx   . Prostate cancer Neg Hx   . Breast cancer Neg Hx     Social History:  reports that he has never smoked. He has never used smokeless tobacco. He reports that he does not drink alcohol and  does not use drugs.  Allergies: No Known Allergies  Medications: I have reviewed the patient's current medications.  Results for orders placed or performed during the hospital encounter of 11/27/20 (from the past 48 hour(s))  CBC with Differential     Status: Abnormal   Collection Time: 11/27/20  1:43 PM  Result Value Ref Range   WBC 3.1 (L) 4.0 - 10.5 K/uL    Comment: WHITE COUNT CONFIRMED ON SMEAR   RBC 4.71 4.22 - 5.81 MIL/uL   Hemoglobin 15.4 13.0 - 17.0 g/dL   HCT 44.9 39.0 - 52.0 %   MCV 95.3 80.0 - 100.0 fL   MCH 32.7 26.0 - 34.0 pg   MCHC 34.3 30.0 - 36.0 g/dL   RDW 13.0 11.5 - 15.5 %   Platelets 160 150 - 400 K/uL   nRBC 0.0 0.0 - 0.2 %   Neutrophils Relative % 76 %   Neutro Abs 2.4 1.7 - 7.7 K/uL   Lymphocytes Relative 4 %   Lymphs Abs 0.1 (L) 0.7 - 4.0 K/uL   Monocytes Relative 15 %   Monocytes Absolute 0.5 0.1 - 1.0 K/uL   Eosinophils  Relative 2 %   Eosinophils Absolute 0.1 0.0 - 0.5 K/uL   Basophils Relative 2 %   Basophils Absolute 0.1 0.0 - 0.1 K/uL   WBC Morphology DOHLE BODIES     Comment: MILD LEFT SHIFT (1-5% METAS, OCC MYELO, OCC BANDS) TOXIC GRANULATION    Immature Granulocytes 1 %   Abs Immature Granulocytes 0.02 0.00 - 0.07 K/uL   Polychromasia PRESENT     Comment: Performed at [facility], [address], [place], [place] [address]  Comprehensive metabolic panel     Status: Abnormal   Collection Time: 11/27/20  1:43 PM  Result Value Ref Range   Sodium 136 135 - 145 mmol/L   Potassium 3.4 (L) 3.5 - 5.1 mmol/L   Chloride 100 98 - 111 mmol/L   CO2 23 22 - 32 mmol/L   Glucose, Bld 183 (H) 70 - 99 mg/dL    Comment: Glucose reference range applies only to samples taken after fasting for at least 8 hours.   BUN 20 8 - 23 mg/dL   Creatinine, Ser 5.83 0.61 - 1.24 mg/dL   Calcium 9.1 8.9 - 46.2 mg/dL   Total Protein 7.6 6.5 - 8.1 g/dL   Albumin 4.1 3.5 - 5.0 g/dL   AST 18 15 - 41 U/L   ALT 37 0 - 44 U/L   Alkaline Phosphatase 67  38 - 126 U/L   Total Bilirubin 1.0 0.3 - 1.2 mg/dL   GFR, Estimated >19 >47 mL/min    Comment: (NOTE) Calculated using the CKD-EPI Creatinine Equation (2021)    Anion gap 13 5 - 15    Comment: Performed at [facility], [address], [place], [place] [address]  Lipase, blood     Status: None   Collection Time: 11/27/20  1:43 PM  Result Value Ref Range   Lipase 18 11 - 51 U/L    Comment: Performed at [facility], [address], [place], [place] [address]  Resp Panel by RT-PCR (Flu A&B, Covid) Nasopharyngeal Swab     Status: None   Collection Time: 11/27/20  1:43 PM   Specimen: Nasopharyngeal Swab; Nasopharyngeal(NP) swabs in vial transport medium  Result Value Ref Range   SARS Coronavirus 2 by RT PCR NEGATIVE NEGATIVE    Comment: (NOTE) SARS-CoV-2 target nucleic acids are NOT DETECTED.  The SARS-CoV-2 RNA is generally detectable in upper respiratory specimens during the acute phase of infection. The lowest concentration of SARS-CoV-2 viral copies this assay can detect is 138 copies/mL. A negative result does not preclude SARS-Cov-2 infection and should not be used as the sole basis for treatment or other patient management decisions. A negative result may occur with  improper specimen collection/handling, submission of specimen other than nasopharyngeal swab, presence of viral mutation(s) within the areas targeted by this assay, and inadequate number of viral copies(<138 copies/mL). A negative result must be combined with clinical observations, patient history, and epidemiological information. The expected result is Negative.  Fact Sheet for Patients:  BloggerCourse.com  Fact Sheet for Healthcare Providers:  SeriousBroker.it  This test is no t yet approved or cleared by the [place] FDA and  has been authorized for detection and/or diagnosis of SARS-CoV-2 by FDA under an Emergency  Use Authorization (EUA). This EUA will remain  in effect (meaning this test can be used) for the duration of the COVID-19 declaration under Section 564(b)(1) of the Act, 21 U.S.C.section 360bbb-3(b)(1), unless the authorization is terminated  or revoked  sooner.       Influenza A by PCR NEGATIVE NEGATIVE   Influenza B by PCR NEGATIVE NEGATIVE    Comment: (NOTE) The Xpert Xpress SARS-CoV-2/FLU/RSV plus assay is intended as an aid in the diagnosis of influenza from Nasopharyngeal swab specimens and should not be used as a sole basis for treatment. Nasal washings and aspirates are unacceptable for Xpert Xpress SARS-CoV-2/FLU/RSV testing.  Fact Sheet for Patients: EntrepreneurPulse.com.au  Fact Sheet for Healthcare Providers: IncredibleEmployment.be  This test is not yet approved or cleared by the [place] FDA and has been authorized for detection and/or diagnosis of SARS-CoV-2 by FDA under an Emergency Use Authorization (EUA). This EUA will remain in effect (meaning this test can be used) for the duration of the COVID-19 declaration under Section 564(b)(1) of the Act, 21 U.S.C. section 360bbb-3(b)(1), unless the authorization is terminated or revoked.  Performed at [facility], [place] [address] [place], [place] [address]   Urinalysis, Routine w reflex microscopic     Status: Abnormal   Collection Time: 11/27/20  3:40 PM  Result Value Ref Range   Color, Urine YELLOW YELLOW   APPearance HAZY (A) CLEAR   Specific Gravity, Urine 1.026 1.005 - 1.030   pH 5.0 5.0 - 8.0   Glucose, UA NEGATIVE NEGATIVE mg/dL   Hgb urine dipstick SMALL (A) NEGATIVE   Bilirubin Urine NEGATIVE NEGATIVE   Ketones, ur 5 (A) NEGATIVE mg/dL   Protein, ur 100 (A) NEGATIVE mg/dL   Nitrite NEGATIVE NEGATIVE   Leukocytes,Ua NEGATIVE NEGATIVE   RBC / HPF 0-5 0 - 5 RBC/hpf   WBC, UA 0-5 0 - 5 WBC/hpf   Bacteria, UA RARE (A) NONE SEEN   Squamous  Epithelial / LPF 0-5 0 - 5   Mucus PRESENT     Comment: Performed at [facility], [place] [address], [place], [place] [address]  Lactic acid, plasma     Status: None   Collection Time: 11/27/20  3:40 PM  Result Value Ref Range   Lactic Acid, Venous 1.2 0.5 - 1.9 mmol/L    Comment: Performed at [facility], [place] [address] [place], [place] [address]    CT ABDOMEN PELVIS W CONTRAST  Result Date: 11/27/2020 CLINICAL DATA:  Diarrhea.  Fatigue.  History of prostate cancer. EXAM: CT ABDOMEN AND PELVIS WITH CONTRAST TECHNIQUE: Multidetector CT imaging of the abdomen and pelvis was performed using the standard protocol following bolus administration of intravenous contrast. CONTRAST:  171mL OMNIPAQUE IOHEXOL 300 MG/ML  SOLN COMPARISON:  03/02/2020 FINDINGS: Lower chest: Lung bases are clear. Hepatobiliary: Liver, gallbladder and biliary tree are normal. Pancreas: Mild fatty atrophy of the pancreas. Spleen: Normal. Adrenals/Urinary Tract: Adrenal glands are normal. Kidneys are normal in size without hydronephrosis or nephrolithiasis. Several bilateral renal cysts present with the largest measuring 5.1 cm over the lower pole right kidney. Visualized ureters are normal as the distal ureters are obscured due to streak artifact from bilateral hip prostheses. Bladder is mostly obscured by the streak artifact from the hip prostheses. Stomach/Bowel: Stomach is normal. There multiple air and fluid-filled dilated small bowel loops measuring up to 4.6 cm in diameter. There appears to be gradual transition to normal caliber ileum as the ileum has intermittent areas of mild wall thickening to the level of the ileocecal valve. No free peritoneal air or pneumatosis. Appendix is normal. Air and stool present throughout the colon. There is mild colonic diverticulosis. Vascular/Lymphatic: Minimal calcified plaque over the abdominal aorta which  is normal in caliber. No adenopathy.  Reproductive: Region of the prostate obscured by streak artifact from hip prostheses. Other: No significant free fluid. Multiple surgical clips over the anterior lower abdomen/pelvic wall likely due to previous hernia repair. Residual left inguinal hernia unchanged containing small amount of fluid and peritoneal fat. Surgical clips over the left inguinal region. Musculoskeletal: Degenerative changes of the spine. Bilateral hip prostheses intact. IMPRESSION: 1. Multiple air and fluid-filled dilated small bowel loops measuring up to 4.6 cm in diameter with gradual transition to normal caliber ileum as the ileum has intermittent areas of mild wall thickening to the level of the ileocecal valve. Findings are likely due to ileus possibly secondary regional enteritis of infectious or inflammatory nature. Distal partial small bowel obstruction is possible. No free peritoneal air. 2. Bilateral renal cysts with the largest measuring 5.1 cm over the lower pole right kidney. 3. Mild colonic diverticulosis. 4. Aortic atherosclerosis. 5. Stable residual left inguinal hernia containing small amount of fluid and peritoneal fat. Aortic Atherosclerosis (ICD10-I70.0). Electronically Signed   By: Marin Olp M.D.   On: 11/27/2020 16:38    Review of Systems  Constitutional: Negative.   HENT: Negative.   Eyes: Negative.   Respiratory: Negative.   Cardiovascular: Negative.   Gastrointestinal: Positive for abdominal distention, diarrhea, nausea and vomiting.  Endocrine: Negative.   Genitourinary: Negative.   Musculoskeletal: Negative.   Skin: Negative.   Allergic/Immunologic: Negative.   Neurological: Negative.   Hematological: Negative.   Psychiatric/Behavioral: Negative.    Blood pressure 133/70, pulse (!) 106, temperature 99.6 F (37.6 C), temperature source Axillary, resp. rate (!) 27, height 5\' 7"  (1.702 m), weight 85.3 kg, SpO2 95 %. Physical Exam Constitutional:      General: He is not in acute distress.     Appearance: Normal appearance. He is obese. He is not ill-appearing.  HENT:     Head: Normocephalic.     Right Ear: External ear normal.     Left Ear: External ear normal.     Nose: Nose normal.     Mouth/Throat:     Mouth: Mucous membranes are moist.     Pharynx: Oropharynx is clear.  Eyes:     General: No scleral icterus.    Extraocular Movements: Extraocular movements intact.     Conjunctiva/sclera: Conjunctivae normal.     Pupils: Pupils are equal, round, and reactive to light.  Cardiovascular:     Rate and Rhythm: Normal rate and regular rhythm.     Pulses: Normal pulses.     Heart sounds: Normal heart sounds.     Comments: No pitting edema lower extr Pulmonary:     Effort: Pulmonary effort is normal. No respiratory distress.     Breath sounds: Normal breath sounds. No stridor.  Abdominal:     General: There is distension.     Tenderness: There is abdominal tenderness.     Comments: Mild tenderness. No peritonitis  Musculoskeletal:        General: No swelling, tenderness or deformity. Normal range of motion.     Cervical back: Normal range of motion and neck supple. No tenderness.  Skin:    General: Skin is warm and dry.     Coloration: Skin is not jaundiced.     Findings: No rash.  Neurological:     General: No focal deficit present.     Mental Status: He is alert and oriented to person, place, and time.  Psychiatric:  Mood and Affect: Mood normal.        Behavior: Behavior normal.     Assessment/Plan: The patient appears to have a partial small bowel obstruction with a thickened distal area of small intestine.  The differential would include an enteritis possibly from the radiation versus adhesions.  They did not feel like this was a mass.  At this point I would recommend NG tube decompression.  In the morning we can then start the small bowel protocol.  In the meantime he will be admitted to the medical service and they will work on hydration and maximizing  his medical condition.  We will follow closely with you.  Autumn Messing III 11/27/2020, 6:09 PM

## 2020-11-27 NOTE — ED Notes (Signed)
Pt refuses to take rectal temperature

## 2020-11-27 NOTE — ED Notes (Signed)
Patient states he is not diabetic and does not need any insulin.  Patient explained the importance of insulin, patient still refuses

## 2020-11-27 NOTE — Progress Notes (Signed)
Pharmacy Antibiotic Note  Jay Bailey is a 79 y.o. male admitted on 11/27/2020 with nausea, loose watery diarrhea, abdominal pain/distension. CT abdomen shows evidence of partial small bowel obstruction as well as possible enteritis at ileum. Pharmacy has been consulted for Unasyn dosing.  Plan: Unasyn 3g IV q6h Monitor renal function, cultures, clinical course  Height: 5\' 7"  (170.2 cm) Weight: 85.3 kg (188 lb) IBW/kg (Calculated) : 66.1  Temp (24hrs), Avg:98.9 F (37.2 C), Min:98.2 F (36.8 C), Max:99.6 F (37.6 C)  Recent Labs  Lab 11/27/20 1343 11/27/20 1540 11/27/20 1934  WBC 3.1*  --   --   CREATININE 1.10  --   --   LATICACIDVEN  --  1.2 1.6    Estimated Creatinine Clearance: 58.7 mL/min (by C-G formula based on SCr of 1.1 mg/dL).    Allergies  Allergen Reactions  . Lisinopril     Other reaction(s): cough    Antimicrobials this admission: 12/26 Unasyn >>  Dose adjustments this admission: --  Microbiology results: 12/26 BCx: sent 12/26 GI panel: sent 12/26 C.diff PCR: sent  Thank you for allowing pharmacy to be a part of this patient's care.  Luiz Ochoa 11/27/2020 8:28 PM

## 2020-11-27 NOTE — ED Provider Notes (Signed)
[facility] DEPT Provider Note   CSN: [id number] Arrival date & time: 11/27/20  1218     History Chief Complaint  Patient presents with  . Diarrhea    Jay Bailey is a 79 y.o. male.  HPI 79 year old male with a history of hyperlipidemia, hypertension, prostate cancer currently undergoing treatment, last radiation treatment was on Thursday 5/23 presents to the ER with complaints of ongoing nausea, nonbloody bilious vomiting, and watery nonbloody diarrhea.  Patient states that this has been ongoing for approximately 3 weeks, worsening over the last week and a half.  Patient is a poor historian.  He endorses some abdominal discomfort.  States he has had poor p.o. intake secondary to his symptoms.  He states he has been progressively getting weaker since he started radiation.  He states that he spoke with his oncologist Dr. Tammi Klippel who encouraged him to come to the ER to be further evaluated.  He states "maybe I have a UTI", but does not endorse any dysuria or hematuria.  Patient states that he has been taking his nausea medicine the he cannot recall the name.  Per chart review this does appear to be Compazine.  He states this has not been helping.  Denies any fevers or chills.  No chest pain or shortness of breath.  No cough or nasal congestion   Patient requesting a soda immediately as I enter the room.  He would not cooperate with a history until I provided this to him.    Past Medical History:  Diagnosis Date  . 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 Arthritis    fingers and toes   . Bradycardia 02/10/2019  . Cancer ([place])   . Congenital deformity of ankle joint   . Depression    a long time ago   . Diabetes mellitus without complication ([place])    type 2  . Hyperlipidemia   . Hypertension   . Inguinal hernia   . Prostate cancer ([place])   . Skin cancer (melanoma) ([place])    historical   . Wears hearing aid    bialteral      Patient Active Problem List   Diagnosis Date Noted  . Partial small bowel obstruction ([place]) 11/27/2020  . 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 Diabetes mellitus without complication ([place]) [id number]  . Left ureteral stone 03/04/2020  . Bradycardia 02/10/2019  . Essential hypertension 02/10/2019  . CP (cerebral palsy) ([place]) 12/29/2018  . Adult failure to thrive 12/27/2018  . Recurrent left inguinal hernia 12/26/2018  . Malignant neoplasm of prostate ([address]) 12/02/2015  . Melanoma ([place]) 01/24/2015  . 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 Elevated prostate specific antigen (PSA) 01/25/2014  . Mixed incontinence 01/25/2014    Past Surgical History:  Procedure Laterality Date  . INGUINAL HERNIA REPAIR     right and left hernias  miltiple times with mesh placement   . INGUINAL HERNIA REPAIR Left 12/26/2018   Procedure: OPEN LEFT INGUINAL HERNIA WITH MESH;  Surgeon: Johnathan Hausen, MD;  Location: [facility]S;  Service: General;  Laterality: Left;  . nose myeloma    . PROSTATECTOMY  2005  . TOTAL HIP ARTHROPLASTY  "long time ago "   bilateral ; [place]        Family History  Problem Relation Age of Onset  . Colon cancer Mother   . Kidney cancer Mother   . Heart disease Father   . Lung  cancer Brother   . Pancreatic cancer Neg Hx   . Prostate cancer Neg Hx   . Breast cancer Neg Hx     Social History   Tobacco Use  . Smoking status: Never Smoker  . Smokeless tobacco: Never Used  Vaping Use  . Vaping Use: Never used  Substance Use Topics  . Alcohol use: No  . Drug use: No    Home Medications Prior to Admission medications   Medication Sig Start Date End Date Taking? Authorizing Provider  amLODipine (NORVASC) 2.5 MG tablet Take 2.5 mg by mouth daily.   Yes [provider]  ascorbic acid (VITAMIN C) 1000 MG tablet Take 1,000 mg by mouth daily.   Yes [provider]  atorvastatin (LIPITOR) 80 MG tablet Take 80 mg by mouth daily.   Yes [provider]  folic acid (FOLVITE) A999333 MCG tablet Take 400 mcg by mouth daily.    Yes [provider]  irbesartan-hydrochlorothiazide (AVALIDE) 150-12.5 MG tablet Take 1 tablet by mouth daily. 08/31/20  Yes [provider]  ACCU-CHEK GUIDE test strip  10/15/20   [provider]  metFORMIN (GLUCOPHAGE-XR) 500 MG 24 hr tablet  11/27/18   [provider]  Multiple Vitamin (MULTIVITAMIN) tablet Take 1 tablet by mouth daily.    [provider]  nystatin (MYCOSTATIN/NYSTOP) powder Apply topically.  08/30/20   [provider]  oxybutynin (DITROPAN-XL) 5 MG 24 hr tablet Take 1 tablet by mouth daily. 10/31/20   [provider]  polyethylene glycol powder (GLYCOLAX/MIRALAX) powder Take 17 g by mouth daily as needed for moderate constipation.  02/01/14   [provider]  prochlorperazine (COMPAZINE) 10 MG tablet Take 1 tablet (10 mg total) by mouth every 6 (six) hours as needed for nausea or vomiting. 11/16/20   Tyler Pita, MD  vitamin E 400 UNIT capsule Take 400 Units by mouth daily.    [provider]    Allergies    Lisinopril  Review of Systems   Review of Systems  Constitutional: Negative for chills and fever.  HENT: Negative for ear pain and sore throat.   Eyes: Negative for pain and visual disturbance.  Respiratory: Negative for cough and shortness of breath.   Cardiovascular: Negative for chest pain and palpitations.  Gastrointestinal: Positive for diarrhea, nausea and vomiting. Negative for abdominal pain, blood in stool and constipation.  Genitourinary: Negative for dysuria and hematuria.  Musculoskeletal: Negative for arthralgias and back pain.  Skin: Negative for color change and rash.  Neurological: Negative for seizures and syncope.  All other systems reviewed and are negative.   Physical Exam Updated Vital Signs BP 133/70   Pulse (!) 106   Temp 99.6 F (37.6 C) (Axillary)   Resp (!) 27   Ht  5\' 7"  (1.702 m)   Wt 85.3 kg   SpO2 95%   BMI 29.44 kg/m   Physical Exam Vitals and nursing note reviewed.  Constitutional:      Appearance: He is well-developed and well-nourished. He is ill-appearing. He is not toxic-appearing.  HENT:     Head: Normocephalic and atraumatic.  Eyes:     Conjunctiva/sclera: Conjunctivae normal.  Cardiovascular:     Rate and Rhythm: Normal rate and regular rhythm.     Heart sounds: No murmur heard.   Pulmonary:     Effort: Pulmonary effort is normal. No respiratory distress.     Breath sounds: Normal breath sounds.  Abdominal:     Palpations: Abdomen is  soft.     Tenderness: There is abdominal tenderness. There is no right CVA tenderness or left CVA tenderness.     Comments: Abdomen is distended, tympanic.  Generally tender, no focal tenderness.  No flank tenderness.  Musculoskeletal:        General: No deformity, signs of injury or edema. Normal range of motion.     Cervical back: Neck supple.     Right lower leg: No edema.     Left lower leg: No edema.  Skin:    General: Skin is warm and dry.  Neurological:     General: No focal deficit present.     Mental Status: He is alert.  Psychiatric:        Mood and Affect: Mood and affect normal.     ED Results / Procedures / Treatments   Labs (all labs ordered are listed, but only abnormal results are displayed) Labs Reviewed  CBC WITH DIFFERENTIAL/PLATELET - Abnormal; Notable for the following components:      Result Value   WBC 3.1 (*)    Lymphs Abs 0.1 (*)    All other components within normal limits  COMPREHENSIVE METABOLIC PANEL - Abnormal; Notable for the following components:   Potassium 3.4 (*)    Glucose, Bld 183 (*)    All other components within normal limits  URINALYSIS, ROUTINE W REFLEX MICROSCOPIC - Abnormal; Notable for the following components:   APPearance HAZY (*)    Hgb urine dipstick SMALL (*)    Ketones, ur 5 (*)    Protein, ur 100 (*)    Bacteria, UA RARE (*)     All other components within normal limits  RESP PANEL BY RT-PCR (FLU A&B, COVID) ARPGX2  CULTURE, BLOOD (ROUTINE X 2)  CULTURE, BLOOD (ROUTINE X 2)  GASTROINTESTINAL PANEL BY PCR, STOOL (REPLACES STOOL CULTURE)  C DIFFICILE QUICK SCREEN W PCR REFLEX  LIPASE, BLOOD  LACTIC ACID, PLASMA  LACTIC ACID, PLASMA    EKG EKG Interpretation  Date/Time:  Sunday November 27 2020 13:42:16 EST Ventricular Rate:  102 PR Interval:    QRS Duration: 90 QT Interval:  324 QTC Calculation: 422 R Axis:   -21 Text Interpretation: Sinus tachycardia Ventricular premature complex Borderline left axis deviation Consider anterior infarct SINCE LAST TRACING HEART RATE HAS INCREASED Confirmed by Malvin Johns [phone]) on 11/27/2020 2:20:58 PM   Radiology CT ABDOMEN PELVIS W CONTRAST  Result Date: 11/27/2020 CLINICAL DATA:  Diarrhea.  Fatigue.  History of prostate cancer. EXAM: CT ABDOMEN AND PELVIS WITH CONTRAST TECHNIQUE: Multidetector CT imaging of the abdomen and pelvis was performed using the standard protocol following bolus administration of intravenous contrast. CONTRAST:  126mL OMNIPAQUE IOHEXOL 300 MG/ML  SOLN COMPARISON:  03/02/2020 FINDINGS: Lower chest: Lung bases are clear. Hepatobiliary: Liver, gallbladder and biliary tree are normal. Pancreas: Mild fatty atrophy of the pancreas. Spleen: Normal. Adrenals/Urinary Tract: Adrenal glands are normal. Kidneys are normal in size without hydronephrosis or nephrolithiasis. Several bilateral renal cysts present with the largest measuring 5.1 cm over the lower pole right kidney. Visualized ureters are normal as the distal ureters are obscured due to streak artifact from bilateral hip prostheses. Bladder is mostly obscured by the streak artifact from the hip prostheses. Stomach/Bowel: Stomach is normal. There multiple air and fluid-filled dilated small bowel loops measuring up to 4.6 cm in diameter. There appears to be gradual transition to normal caliber ileum  as the ileum has intermittent areas of mild wall thickening to the level of the  ileocecal valve. No free peritoneal air or pneumatosis. Appendix is normal. Air and stool present throughout the colon. There is mild colonic diverticulosis. Vascular/Lymphatic: Minimal calcified plaque over the abdominal aorta which is normal in caliber. No adenopathy. Reproductive: Region of the prostate obscured by streak artifact from hip prostheses. Other: No significant free fluid. Multiple surgical clips over the anterior lower abdomen/pelvic wall likely due to previous hernia repair. Residual left inguinal hernia unchanged containing small amount of fluid and peritoneal fat. Surgical clips over the left inguinal region. Musculoskeletal: Degenerative changes of the spine. Bilateral hip prostheses intact. IMPRESSION: 1. Multiple air and fluid-filled dilated small bowel loops measuring up to 4.6 cm in diameter with gradual transition to normal caliber ileum as the ileum has intermittent areas of mild wall thickening to the level of the ileocecal valve. Findings are likely due to ileus possibly secondary regional enteritis of infectious or inflammatory nature. Distal partial small bowel obstruction is possible. No free peritoneal air. 2. Bilateral renal cysts with the largest measuring 5.1 cm over the lower pole right kidney. 3. Mild colonic diverticulosis. 4. Aortic atherosclerosis. 5. Stable residual left inguinal hernia containing small amount of fluid and peritoneal fat. Aortic Atherosclerosis (ICD10-I70.0). Electronically Signed   By: Marin Olp M.D.   On: 11/27/2020 16:38    Procedures Procedures (including critical care time)  Medications Ordered in ED Medications  dextrose 5 % in lactated ringers infusion (has no administration in time range)  sodium chloride 0.9 % bolus 1,000 mL (0 mLs Intravenous Stopped 11/27/20 1535)  ondansetron (ZOFRAN) injection 4 mg (4 mg Intravenous Given 11/27/20 1340)  iohexol  (OMNIPAQUE) 300 MG/ML solution 100 mL (100 mLs Intravenous Contrast Given 11/27/20 1615)  haloperidol lactate (HALDOL) injection 1 mg (1 mg Intravenous Given 11/27/20 1531)    ED Course  I have reviewed the triage vital signs and the nursing notes.  Pertinent labs & imaging results that were available during my care of the patient were reviewed by me and considered in my medical decision making (see chart for details).    MDM Rules/Calculators/A&P                         79 year old male with progressive weakness, nausea, vomiting and diarrhea with abdominal discomfort in the setting of recent radiation treatment for prostate cancer.   On presentation, he is alert, oriented, ill-appearing, resting comfortably in the ER bed.  Vitals on arrival with a borderline fever 99.6, patient is refusing rectal temp.  Mildly tachycardic with rates between 101 and 106.  Not tachypneic on my exam.  Abdomen is very distended, tympanic, generally tender.  No visible lower extremity edema. He was able to tolerate a small amount of fluids here in the ED with no active vomiting.  DDx includes small bowel obstruction, gastroenteritis, diverticulitis, C. difficile, toxic megacolon, cholecystitis, constipation  Labs ordered, reviewed and interpreted by me.  Work shows a leukopenia of 3.1, normal hemoglobin.  CMP without any significant electrode abnormalities, normal creatinine.  Normal LFTs.  UA with evidence of dehydration, and small amount of hemoglobin, however no evidence of UTI.  Normal lactic acid.  Blood cultures pending.  EKG with sinus tach.  Covid is negative.  C. difficile and stool culture pending.  I ordered reviewed and interpreted his CT scan of the abdomen along with radiology -Evidence of possible ileus with possible partial small bowel obstruction.  MDM: Patient was treated with Zofran, Haldol for nausea, given 1 L  fluid bolus.  Continuing to endorse nausea.  Abdomen remains distended.  He has not  had a bowel movement here in the ER.  Given his ongoing symptoms, tachycardia is likely due to dehydration, known overall appearance, do not think that it is safe for the patient to go home.  Spoke with general surgery Dr. Marlou Starks who states that the patient needs to be admitted to medicine and general surgery can be consulted, however there is no surgical emergency at this time.  Consulted Dr. Posey Pronto who will admit the patient for further evaluation and treatment.  Patient remains hemodynamically stable for admission at this time.  This was a shared visit with my supervising physician Dr. Tamera Punt  who independently saw and evaluated the patient & provided guidance in evaluation/management/disposition ,in agreement with care   Final Clinical Impression(s) / ED Diagnoses Final diagnoses:  Nausea vomiting and diarrhea  Ileus of unspecified type [facility])    Rx / DC Orders ED Discharge Orders    None       Garald Balding, PA-C 11/27/20 Larry Sierras, MD 11/27/20 [phone]

## 2020-11-28 ENCOUNTER — Inpatient Hospital Stay (HOSPITAL_COMMUNITY): Payer: Medicare Other

## 2020-11-28 ENCOUNTER — Encounter (HOSPITAL_COMMUNITY): Payer: Self-pay | Admitting: Internal Medicine

## 2020-11-28 ENCOUNTER — Telehealth: Payer: Self-pay | Admitting: Radiation Oncology

## 2020-11-28 ENCOUNTER — Ambulatory Visit: Payer: Medicare Other

## 2020-11-28 DIAGNOSIS — K567 Ileus, unspecified: Secondary | ICD-10-CM | POA: Diagnosis not present

## 2020-11-28 LAB — GASTROINTESTINAL PANEL BY PCR, STOOL (REPLACES STOOL CULTURE)

## 2020-11-28 LAB — CBC
HCT: 41 % (ref 39.0–52.0)
Hemoglobin: 13.9 g/dL (ref 13.0–17.0)
MCH: 32.3 pg (ref 26.0–34.0)
MCHC: 33.9 g/dL (ref 30.0–36.0)
MCV: 95.1 fL (ref 80.0–100.0)
Platelets: 152 10*3/uL (ref 150–400)
RBC: 4.31 MIL/uL (ref 4.22–5.81)
RDW: 13.2 % (ref 11.5–15.5)
WBC: 4.4 10*3/uL (ref 4.0–10.5)
nRBC: 0 % (ref 0.0–0.2)

## 2020-11-28 LAB — COMPREHENSIVE METABOLIC PANEL
ALT: 29 U/L (ref 0–44)
AST: 19 U/L (ref 15–41)
Albumin: 3.2 g/dL — ABNORMAL LOW (ref 3.5–5.0)
Alkaline Phosphatase: 46 U/L (ref 38–126)
Anion gap: 11 (ref 5–15)
BUN: 21 mg/dL (ref 8–23)
CO2: 21 mmol/L — ABNORMAL LOW (ref 22–32)
Calcium: 7.9 mg/dL — ABNORMAL LOW (ref 8.9–10.3)
Chloride: 102 mmol/L (ref 98–111)
Creatinine, Ser: 1.1 mg/dL (ref 0.61–1.24)
GFR, Estimated: >60 mL/min (ref >60)
Glucose, Bld: 163 mg/dL — ABNORMAL HIGH (ref 70–99)
Potassium: 4.4 mmol/L (ref 3.5–5.1)
Sodium: 134 mmol/L — ABNORMAL LOW (ref 135–145)
Total Bilirubin: 0.9 mg/dL (ref 0.3–1.2)
Total Protein: 6 g/dL — ABNORMAL LOW (ref 6.5–8.1)

## 2020-11-28 LAB — HEMOGLOBIN A1C
Hgb A1c MFr Bld: 6.3 % — ABNORMAL HIGH (ref 4.8–5.6)
Mean Plasma Glucose: 134.11 mg/dL

## 2020-11-28 LAB — GLUCOSE, CAPILLARY
Glucose-Capillary: 115 mg/dL — ABNORMAL HIGH (ref 70–99)
Glucose-Capillary: 117 mg/dL — ABNORMAL HIGH (ref 70–99)
Glucose-Capillary: 150 mg/dL — ABNORMAL HIGH (ref 70–99)
Glucose-Capillary: 173 mg/dL — ABNORMAL HIGH (ref 70–99)

## 2020-11-28 LAB — MAGNESIUM: Magnesium: 1.2 mg/dL — ABNORMAL LOW (ref 1.7–2.4)

## 2020-11-28 LAB — CBG MONITORING, ED: Glucose-Capillary: 150 mg/dL — ABNORMAL HIGH (ref 70–99)

## 2020-11-28 MED ORDER — MAGNESIUM SULFATE 2 GM/50ML IV SOLN
2.0000 g | Freq: Once | INTRAVENOUS | Status: AC
  Administered 2020-11-28: 09:00:00 2 g via INTRAVENOUS
  Filled 2020-11-28: qty 50

## 2020-11-28 NOTE — Telephone Encounter (Signed)
Received voicemail message from patient's wife requesting return call. Phoned her back. No answer. Left voicemail message explaining I am aware her husband is hospitalized. Explained his radiation treatment will be held today. Explained I will check his status tomorrow. Provided my direct number for future needs.

## 2020-11-28 NOTE — TOC Initial Note (Signed)
Transition of Care [facility]) - Initial/Assessment Note    Patient Details  Name: Jay Bailey MRN: [id number] Date of Birth: 06-03-1941  [facility]) CM/SW Contact:    Lanier Clam, RN Phone Number: 11/28/2020, 2:35 PM  Clinical Narrative:  From home alone-indep w/adl's;drives;has own transport. DNR. Noted xrt held.Monitor for d/c needs.                 Expected Discharge Plan: Home/Self Care Barriers to Discharge: Continued Medical Work up   Patient Goals and CMS Choice Patient states their goals for this hospitalization and ongoing recovery are:: go home CMS Medicare.gov Compare Post Acute Care list provided to:: Patient Represenative (must comment) (sister Shelly [phone]) Choice offered to / list presented to : Sibling  Expected Discharge Plan and Services Expected Discharge Plan: Home/Self Care   Discharge Planning Services: CM Consult   Living arrangements for the past 2 months: Single Family Home                                      Prior Living Arrangements/Services Living arrangements for the past 2 months: Single Family Home Lives with:: Self Patient language and need for interpreter reviewed:: Yes Do you feel safe going back to the place where you live?: Yes      Need for Family Participation in Patient Care: No (Comment) Care giver support system in place?: Yes (comment) Current home services: DME (cane, rw) Criminal Activity/Legal Involvement Pertinent to Current Situation/Hospitalization: No - Comment as needed  Activities of Daily Living Home Assistive Devices/Equipment: Walker (specify type),Cane (specify quad or straight) ADL Screening (condition at time of admission) Patient's cognitive ability adequate to safely complete daily activities?: Yes Is the patient deaf or have difficulty hearing?: Yes Does the patient have difficulty seeing, even when wearing glasses/contacts?: No Does the patient have difficulty concentrating,  remembering, or making decisions?: No Patient able to express need for assistance with ADLs?: Yes Does the patient have difficulty dressing or bathing?: No Independently performs ADLs?: Yes (appropriate for developmental age) Does the patient have difficulty walking or climbing stairs?: Yes (uses cane and walker) Weakness of Legs: Both Weakness of Arms/Hands: None  Permission Sought/Granted Permission sought to share information with : Case Manager Permission granted to share information with : Yes, Verbal Permission Granted  Share Information with NAME: Case Manager     Permission granted to share info w Relationship: Shelly [phone]     Emotional Assessment Appearance:: Appears stated age Attitude/Demeanor/Rapport: Gracious Affect (typically observed): Accepting Orientation: : Oriented to Self,Oriented to Place,Oriented to  Time,Oriented to Situation Alcohol / Substance Use: Not Applicable Psych Involvement: No (comment)  Admission diagnosis:  Small bowel obstruction (HCC) [K56.609] Partial small bowel obstruction (HCC) [K56.600] Nausea vomiting and diarrhea [R11.2, R19.7] Ileus of unspecified type [facility]) [K56.7] Patient Active Problem List   Diagnosis Date Noted  . Partial small bowel obstruction (H[facility]) 11/27/2020  . 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 Diabetes mellitus without complication ([facility]) 04/26/2020  . Left ureteral stone 03/04/2020  . Bradycardia 02/10/2019  . Essential hypertension 02/10/2019  . CP (cerebral palsy) ([facility]) 12/29/2018  . Adult failure to thrive 12/27/2018  . Recurrent left inguinal hernia 12/26/2018  . Malignant neoplasm of prostate ([facility]) 12/02/2015  . Melanoma ([facility]) 01/24/2015  . Malignant melanoma of nose ([facility]) 12/21/2014  . Elevated prostate specific antigen (PSA) 01/25/2014  . Mixed incontinence 01/25/2014  PCP:  Laurann Montana, MD Pharmacy:   [facility] - [place], [place] - [address] [place] [address] Phone: [phone] Fax: [phone]     Social Determinants of Health (SDOH) Interventions    Readmission Risk Interventions No flowsheet data found.

## 2020-11-28 NOTE — ED Notes (Signed)
Pt is continuously calling the desk asking for water and ice. He is demanding ice and water because he is going to vomit. I have advised pt numerous times he diet has been ordered by the Dr as NPO. Jay Bailey, NT

## 2020-11-28 NOTE — ED Notes (Signed)
Patient refused CBG and states, "I am not a diabetic."

## 2020-11-28 NOTE — ED Notes (Signed)
ED TO INPATIENT HANDOFF REPORT  ED Nurse Name and Phone #:  Luetta Nutting, RN  S Name/Age/Gender Jay Bailey 79 y.o. male Room/Bed: WA22/WA22  Code Status   Code Status: DNR  Home/SNF/Other Home Patient oriented to: self, place, time and situation Is this baseline? Yes   Triage Complete: Triage complete  Chief Complaint Partial small bowel obstruction ([place]) [K56.600]  Triage Note Pt came from home via EMS. Diarrhea and fatigue for 1.5 wks. Due to radiation tx, ocology doctor told pt that these side effects would most likely occur. No LOC, no dizziness, no hypotension. A&O x4, ambulatory. CBG: 185. PMH of T2DM. Pt just reports feeling fatigued.     Allergies Allergies  Allergen Reactions  . Lisinopril     Other reaction(s): cough    Level of Care/Admitting Diagnosis ED Disposition    ED Disposition Condition Comment   Admit  Hospital Area: [facility] [[id number]]  Level of Care: Telemetry [5]  Admit to tele based on following criteria: Complex arrhythmia (Bradycardia/Tachycardia)  May admit patient to [facility] or [facility] if equivalent level of care is available:: No  Covid Evaluation: Confirmed COVID Negative  Diagnosis: [id number]  Admitting Physician: Lavina Hamman [id number]  Attending Physician: Lavina Hamman [id number]  Estimated length of stay: past midnight tomorrow  Certification:: I certify this patient will need inpatient services for at least 2 midnights       B Medical/Surgery History Past Medical History:  Diagnosis Date  . 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 Arthritis    fingers and toes   . Bradycardia 02/10/2019  . Cancer ([place])   . Congenital deformity of ankle joint   . Depression    a long time ago   . Diabetes mellitus without complication ([place])    type 2  . Hyperlipidemia   . Hypertension   . Inguinal hernia   . Prostate cancer ([place])   . Skin  cancer (melanoma) ([place])    historical   . Wears hearing aid    bialteral    Past Surgical History:  Procedure Laterality Date  . INGUINAL HERNIA REPAIR     right and left hernias  miltiple times with mesh placement   . INGUINAL HERNIA REPAIR Left 12/26/2018   Procedure: OPEN LEFT INGUINAL HERNIA WITH MESH;  Surgeon: Johnathan Hausen, MD;  Location: [facility];  Service: General;  Laterality: Left;  . nose myeloma    . PROSTATECTOMY  2005  . TOTAL HIP ARTHROPLASTY  "long time ago "   bilateral ; [place]      A IV Location/Drains/Wounds Patient Lines/Drains/Airways Status    Active Line/Drains/Airways    Name Placement date Placement time Site Days   Peripheral IV 11/27/20 Left Antecubital 11/27/20  1335  Antecubital  1   Peripheral IV 11/27/20 Right Forearm 11/27/20  1949  Forearm  1   Incision (Closed) 12/26/18 Abdomen Left 12/26/18  0956  -- 703          Intake/Output Last 24 hours  Intake/Output Summary (Last 24 hours) at 11/28/2020 0209 Last data filed at 11/27/2020 2204 Gross per 24 hour  Intake 900 ml  Output --  Net 900 ml    Labs/Imaging Results for orders placed or performed during the hospital encounter of 11/27/20 (from the past 48 hour(s))  CBC with Differential     Status: Abnormal   Collection Time: 11/27/20  1:43 PM  Result Value Ref Range   WBC 3.1 (L) 4.0 - 10.5 K/uL    Comment: WHITE COUNT CONFIRMED ON SMEAR   RBC 4.71 4.22 - 5.81 MIL/uL   Hemoglobin 15.4 13.0 - 17.0 g/dL   HCT 44.9 39.0 - 52.0 %   MCV 95.3 80.0 - 100.0 fL   MCH 32.7 26.0 - 34.0 pg   MCHC 34.3 30.0 - 36.0 g/dL   RDW 13.0 11.5 - 15.5 %   Platelets 160 150 - 400 K/uL   nRBC 0.0 0.0 - 0.2 %   Neutrophils Relative % 76 %   Neutro Abs 2.4 1.7 - 7.7 K/uL   Lymphocytes Relative 4 %   Lymphs Abs 0.1 (L) 0.7 - 4.0 K/uL   Monocytes Relative 15 %   Monocytes Absolute 0.5 0.1 - 1.0 K/uL   Eosinophils Relative 2 %   Eosinophils Absolute 0.1 0.0 - 0.5 K/uL   Basophils Relative 2 %    Basophils Absolute 0.1 0.0 - 0.1 K/uL   WBC Morphology DOHLE BODIES     Comment: MILD LEFT SHIFT (1-5% METAS, OCC MYELO, OCC BANDS) TOXIC GRANULATION    Immature Granulocytes 1 %   Abs Immature Granulocytes 0.02 0.00 - 0.07 K/uL   Polychromasia PRESENT     Comment: Performed at [facility], [place] [address] [place], [place] [address]  Comprehensive metabolic panel     Status: Abnormal   Collection Time: 11/27/20  1:43 PM  Result Value Ref Range   Sodium 136 135 - 145 mmol/L   Potassium 3.4 (L) 3.5 - 5.1 mmol/L   Chloride 100 98 - 111 mmol/L   CO2 23 22 - 32 mmol/L   Glucose, Bld 183 (H) 70 - 99 mg/dL    Comment: Glucose reference range applies only to samples taken after fasting for at least 8 hours.   BUN 20 8 - 23 mg/dL   Creatinine, Ser 1.10 0.61 - 1.24 mg/dL   Calcium 9.1 8.9 - 10.3 mg/dL   Total Protein 7.6 6.5 - 8.1 g/dL   Albumin 4.1 3.5 - 5.0 g/dL   AST 18 15 - 41 U/L   ALT 37 0 - 44 U/L   Alkaline Phosphatase 67 38 - 126 U/L   Total Bilirubin 1.0 0.3 - 1.2 mg/dL   GFR, Estimated >60 >60 mL/min    Comment: (NOTE) Calculated using the CKD-EPI Creatinine Equation (2021)    Anion gap 13 5 - 15    Comment: Performed at [facility], [place] [address], [place], [place] [address]  Lipase, blood     Status: None   Collection Time: 11/27/20  1:43 PM  Result Value Ref Range   Lipase 18 11 - 51 U/L    Comment: Performed at [facility], [place] [address], [place],  [address]  Resp Panel by RT-PCR (Flu A&B, Covid) Nasopharyngeal Swab     Status: None   Collection Time: 11/27/20  1:43 PM   Specimen: Nasopharyngeal Swab; Nasopharyngeal(NP) swabs in vial transport medium  Result Value Ref Range   SARS Coronavirus 2 by RT PCR NEGATIVE NEGATIVE    Comment: (NOTE) SARS-CoV-2 target nucleic acids are NOT DETECTED.  The SARS-CoV-2 RNA is generally detectable in upper respiratory specimens during the acute phase of  infection. The lowest concentration of SARS-CoV-2 viral copies this assay can detect is 138 copies/mL. A negative result does not preclude SARS-Cov-2 infection and should not be used as the sole basis  for treatment or other patient management decisions. A negative result may occur with  improper specimen collection/handling, submission of specimen other than nasopharyngeal swab, presence of viral mutation(s) within the areas targeted by this assay, and inadequate number of viral copies(<138 copies/mL). A negative result must be combined with clinical observations, patient history, and epidemiological information. The expected result is Negative.  Fact Sheet for Patients:  EntrepreneurPulse.com.au  Fact Sheet for Healthcare Providers:  IncredibleEmployment.be  This test is no t yet approved or cleared by the [place] FDA and  has been authorized for detection and/or diagnosis of SARS-CoV-2 by FDA under an Emergency Use Authorization (EUA). This EUA will remain  in effect (meaning this test can be used) for the duration of the COVID-19 declaration under Section 564(b)(1) of the Act, 21 U.S.C.section 360bbb-3(b)(1), unless the authorization is terminated  or revoked sooner.       Influenza A by PCR NEGATIVE NEGATIVE   Influenza B by PCR NEGATIVE NEGATIVE    Comment: (NOTE) The Xpert Xpress SARS-CoV-2/FLU/RSV plus assay is intended as an aid in the diagnosis of influenza from Nasopharyngeal swab specimens and should not be used as a sole basis for treatment. Nasal washings and aspirates are unacceptable for Xpert Xpress SARS-CoV-2/FLU/RSV testing.  Fact Sheet for Patients: EntrepreneurPulse.com.au  Fact Sheet for Healthcare Providers: IncredibleEmployment.be  This test is not yet approved or cleared by the [place] FDA and has been authorized for detection and/or diagnosis of SARS-CoV-2 by FDA  under an Emergency Use Authorization (EUA). This EUA will remain in effect (meaning this test can be used) for the duration of the COVID-19 declaration under Section 564(b)(1) of the Act, 21 U.S.C. section 360bbb-3(b)(1), unless the authorization is terminated or revoked.  Performed at [facility], [place] [address] [place], [place] [address]   Urinalysis, Routine w reflex microscopic     Status: Abnormal   Collection Time: 11/27/20  3:40 PM  Result Value Ref Range   Color, Urine YELLOW YELLOW   APPearance HAZY (A) CLEAR   Specific Gravity, Urine 1.026 1.005 - 1.030   pH 5.0 5.0 - 8.0   Glucose, UA NEGATIVE NEGATIVE mg/dL   Hgb urine dipstick SMALL (A) NEGATIVE   Bilirubin Urine NEGATIVE NEGATIVE   Ketones, ur 5 (A) NEGATIVE mg/dL   Protein, ur 100 (A) NEGATIVE mg/dL   Nitrite NEGATIVE NEGATIVE   Leukocytes,Ua NEGATIVE NEGATIVE   RBC / HPF 0-5 0 - 5 RBC/hpf   WBC, UA 0-5 0 - 5 WBC/hpf   Bacteria, UA RARE (A) NONE SEEN   Squamous Epithelial / LPF 0-5 0 - 5   Mucus PRESENT     Comment: Performed at [facility], [place] [address] [place], [place] [address]  Lactic acid, plasma     Status: None   Collection Time: 11/27/20  3:40 PM  Result Value Ref Range   Lactic Acid, Venous 1.2 0.5 - 1.9 mmol/L    Comment: Performed at [facility], [place] [address], [place], [place] [address]  Lactic acid, plasma     Status: None   Collection Time: 11/27/20  7:34 PM  Result Value Ref Range   Lactic Acid, Venous 1.6 0.5 - 1.9 mmol/L    Comment: Performed at [facility], [place] [address] [place], [place] [address]  C Difficile Quick Screen w PCR reflex     Status: None   Collection Time: 11/27/20  7:40 PM   Specimen: Stool  Result Value  Ref Range   C Diff antigen NEGATIVE NEGATIVE   C Diff toxin NEGATIVE NEGATIVE   C Diff interpretation No C. difficile detected.     Comment: Performed at [facility],  [place] [address] [place], [place] [address]  CBG monitoring, ED     Status: Abnormal   Collection Time: 11/27/20 11:21 PM  Result Value Ref Range   Glucose-Capillary 163 (H) 70 - 99 mg/dL    Comment: Glucose reference range applies only to samples taken after fasting for at least 8 hours.   CT ABDOMEN PELVIS W CONTRAST  Result Date: 11/27/2020 CLINICAL DATA:  Diarrhea.  Fatigue.  History of prostate cancer. EXAM: CT ABDOMEN AND PELVIS WITH CONTRAST TECHNIQUE: Multidetector CT imaging of the abdomen and pelvis was performed using the standard protocol following bolus administration of intravenous contrast. CONTRAST:  161mL OMNIPAQUE IOHEXOL 300 MG/ML  SOLN COMPARISON:  03/02/2020 FINDINGS: Lower chest: Lung bases are clear. Hepatobiliary: Liver, gallbladder and biliary tree are normal. Pancreas: Mild fatty atrophy of the pancreas. Spleen: Normal. Adrenals/Urinary Tract: Adrenal glands are normal. Kidneys are normal in size without hydronephrosis or nephrolithiasis. Several bilateral renal cysts present with the largest measuring 5.1 cm over the lower pole right kidney. Visualized ureters are normal as the distal ureters are obscured due to streak artifact from bilateral hip prostheses. Bladder is mostly obscured by the streak artifact from the hip prostheses. Stomach/Bowel: Stomach is normal. There multiple air and fluid-filled dilated small bowel loops measuring up to 4.6 cm in diameter. There appears to be gradual transition to normal caliber ileum as the ileum has intermittent areas of mild wall thickening to the level of the ileocecal valve. No free peritoneal air or pneumatosis. Appendix is normal. Air and stool present throughout the colon. There is mild colonic diverticulosis. Vascular/Lymphatic: Minimal calcified plaque over the abdominal aorta which is normal in caliber. No adenopathy. Reproductive: Region of the prostate obscured by streak artifact from hip prostheses. Other: No significant free  fluid. Multiple surgical clips over the anterior lower abdomen/pelvic wall likely due to previous hernia repair. Residual left inguinal hernia unchanged containing small amount of fluid and peritoneal fat. Surgical clips over the left inguinal region. Musculoskeletal: Degenerative changes of the spine. Bilateral hip prostheses intact. IMPRESSION: 1. Multiple air and fluid-filled dilated small bowel loops measuring up to 4.6 cm in diameter with gradual transition to normal caliber ileum as the ileum has intermittent areas of mild wall thickening to the level of the ileocecal valve. Findings are likely due to ileus possibly secondary regional enteritis of infectious or inflammatory nature. Distal partial small bowel obstruction is possible. No free peritoneal air. 2. Bilateral renal cysts with the largest measuring 5.1 cm over the lower pole right kidney. 3. Mild colonic diverticulosis. 4. Aortic atherosclerosis. 5. Stable residual left inguinal hernia containing small amount of fluid and peritoneal fat. Aortic Atherosclerosis (ICD10-I70.0). Electronically Signed   By: Marin Olp M.D.   On: 11/27/2020 16:38    Pending Labs Unresulted Labs (From admission, onward)          Start     Ordered   11/28/20 0500  Comprehensive metabolic panel  Tomorrow morning,   R        11/27/20 1942   11/28/20 0500  CBC  Tomorrow morning,   R        11/27/20 1942   11/28/20 0500  Magnesium  Tomorrow morning,   R       Question:  Specimen collection  method  Answer:  Unit=Unit collect   11/27/20 2027   11/28/20 0500  Hemoglobin A1c  Tomorrow morning,   R        11/27/20 2028   11/27/20 1437  Blood culture (routine x 2)  BLOOD CULTURE X 2,   STAT      11/27/20 1436   11/27/20 1437  Gastrointestinal Panel by PCR , Stool  (Gastrointestinal Panel by PCR, Stool                                                                                                                                                     *Does Not include  CLOSTRIDIUM DIFFICILE testing.**If CDIFF testing is needed, select the C Difficile Quick Screen (NO PCR Reflex) order below)  Once,   STAT        11/27/20 1436          Vitals/Pain Today's Vitals   11/27/20 2330 11/28/20 0030 11/28/20 0100 11/28/20 0200  BP: 125/70 (!) 142/78 117/75 126/67  Pulse: 98 100 (!) 101 92  Resp: (!) 24 (!) 27 (!) 26 (!) 26  Temp: 98.4 F (36.9 C)     TempSrc: Axillary     SpO2: 94% 90% 96% 96%  Weight:      Height:      PainSc:        Isolation Precautions Enteric precautions (UV disinfection)  Medications Medications  insulin aspart (novoLOG) injection 0-9 Units (0 Units Subcutaneous Not Given 11/27/20 2322)  enoxaparin (LOVENOX) injection 40 mg (40 mg Subcutaneous Not Given 11/27/20 2104)  lactated ringers infusion ( Intravenous Infusion Verify 11/27/20 2314)  acetaminophen (TYLENOL) tablet 650 mg (has no administration in time range)    Or  acetaminophen (TYLENOL) suppository 650 mg (has no administration in time range)  morphine 2 MG/ML injection 2 mg (has no administration in time range)  methocarbamol (ROBAXIN) 500 mg in dextrose 5 % 50 mL IVPB (has no administration in time range)  ondansetron (ZOFRAN) tablet 4 mg ( Oral See Alternative 11/28/20 0129)    Or  ondansetron (ZOFRAN) injection 4 mg (4 mg Intravenous Given 11/28/20 0129)  metoprolol tartrate (LOPRESSOR) injection 5 mg (has no administration in time range)  LORazepam (ATIVAN) injection 0.5 mg (0.5 mg Intravenous Given 11/28/20 0128)  Ampicillin-Sulbactam (UNASYN) 3 g in sodium chloride 0.9 % 100 mL IVPB (has no administration in time range)  sodium chloride 0.9 % bolus 1,000 mL (0 mLs Intravenous Stopped 11/27/20 1535)  ondansetron (ZOFRAN) injection 4 mg (4 mg Intravenous Given 11/27/20 1340)  iohexol (OMNIPAQUE) 300 MG/ML solution 100 mL (100 mLs Intravenous Contrast Given 11/27/20 1615)  haloperidol lactate (HALDOL) injection 1 mg (1 mg Intravenous Given 11/27/20 1531)   potassium chloride 10 mEq in 100 mL IVPB (0 mEq Intravenous Stopped 11/28/20 0145)  Ampicillin-Sulbactam (UNASYN) 3 g in sodium  chloride 0.9 % 100 mL IVPB (0 g Intravenous Stopped 11/27/20 2148)    Mobility walks with person assist Low fall risk   Focused Assessments GI   R Recommendations: See Admitting Provider Note  Report given to:   Additional Notes: C-Diff negative

## 2020-11-28 NOTE — Progress Notes (Signed)
Per Dr. Margaretmary Dys, radiation oncologist, prostate radiation will be held today allowing ED to complete work up. Therapist on treatment machine notified via email.

## 2020-11-28 NOTE — Progress Notes (Signed)
Progress Note: General Surgery Service   Chief Complaint/Subjective: Does not complain of abdominal pain, denies vomiting overnight  Objective: Vital signs in last 24 hours: Temp:  [98.2 F (36.8 C)-99.6 F (37.6 C)] 98.7 F (37.1 C) (12/27 0300) Pulse Rate:  [42-106] 78 (12/27 0800) Resp:  [16-35] 16 (12/27 0800) BP: (104-151)/(65-90) 113/66 (12/27 0800) SpO2:  [90 %-98 %] 94 % (12/27 0800) Weight:  [85.3 kg] 85.3 kg (12/26 1254) Last BM Date: 11/27/20  Intake/Output from previous day: 12/26 0701 - 12/27 0700 In: 900 [IV Piggyback:900] Out: -  Intake/Output this shift: No intake/output data recorded.  Gen: somnolent, arousable, vomit seen on gown and neck, AOx1  Resp: nonlabored  Card: RRR  Abd: soft, distended, no guarding  Neuro: unable to answer simple questions, concern for confabulation  Lab Results: CBC  Recent Labs    11/27/20 1343  WBC 3.1*  HGB 15.4  HCT 44.9  PLT 160   BMET Recent Labs    11/27/20 1343 11/28/20 0315  NA 136 134*  K 3.4* 4.4  CL 100 102  CO2 23 21*  GLUCOSE 183* 163*  BUN 20 21  CREATININE 1.10 1.10  CALCIUM 9.1 7.9*   PT/INR No results for input(s): LABPROT, INR in the last 72 hours. ABG No results for input(s): PHART, HCO3 in the last 72 hours.  Invalid input(s): PCO2, PO2  Anti-infectives: Anti-infectives (From admission, onward)   Start     Dose/Rate Route Frequency Ordered Stop   11/28/20 0400  Ampicillin-Sulbactam (UNASYN) 3 g in sodium chloride 0.9 % 100 mL IVPB        3 g 200 mL/hr over 30 Minutes Intravenous Every 6 hours 11/27/20 2027     11/27/20 2030  Ampicillin-Sulbactam (UNASYN) 3 g in sodium chloride 0.9 % 100 mL IVPB        3 g 200 mL/hr over 30 Minutes Intravenous NOW 11/27/20 2027 11/27/20 2148   11/27/20 2000  cefTRIAXone (ROCEPHIN) 1 g in sodium chloride 0.9 % 100 mL IVPB  Status:  Discontinued        1 g 200 mL/hr over 30 Minutes Intravenous Every 24 hours 11/27/20 1956 11/27/20 1957    11/27/20 2000  metroNIDAZOLE (FLAGYL) IVPB 500 mg  Status:  Discontinued        500 mg 100 mL/hr over 60 Minutes Intravenous Every 8 hours 11/27/20 1956 11/27/20 1957      Medications: Scheduled Meds: . enoxaparin (LOVENOX) injection  40 mg Subcutaneous Q24H  . insulin aspart  0-9 Units Subcutaneous Q4H   Continuous Infusions: . ampicillin-sulbactam (UNASYN) IV 3 g (11/28/20 0322)  . lactated ringers 75 mL/hr at 11/28/20 0737  . magnesium sulfate bolus IVPB    . methocarbamol (ROBAXIN) IV     PRN Meds:.acetaminophen **OR** acetaminophen, LORazepam, methocarbamol (ROBAXIN) IV, metoprolol tartrate, morphine injection, ondansetron **OR** ondansetron (ZOFRAN) IV  Assessment/Plan: 79 yo male with distended abdomen 1 month after radiation for prostate cancer. He has been having diarrhea and work up showing distended small intestine. Most concerning for enteritis, small bowel obstruction second possibility. He got gastrograffin yesterday but did not get NG tube placement -recommend urgent NG tube placement -XR this am, XR abd tomorrow -continued bowel rest -once Ng in place and functional I am ok with ice chips. -as a reminder gastrograffin poses a pneumonitis risk and generally should only be given with working intestine or mechanical way to decompress the stomach   LOS: 1 day   Mickeal Skinner, MD [phone]  [phone] [facility]

## 2020-11-28 NOTE — Progress Notes (Signed)
[facility] Progress Note  Patient: Jay Bailey    LEX:[id number]  DOA: 11/27/2020     Date of Service: the patient was seen and examined on 11/28/2020  Brief hospital course:  Jay Bailey is a 79 y.o. male with Past medical history of HTN, HLD, cerebral palsy, depression, type II DM, melanoma SP nasal surgery, prostate cancer SP prostatectomy with recently increased PSA SP radiation. Patient presents with complaints of nausea and diarrhea. Found to have a partial small bowel obstruction. Currently plan is conservative measures.  Assessment and Plan: 1.  Partial small bowel obstruction Possible terminal ileitis Diarrhea Nausea with dry heaves CT shows evidence of partial small bowel obstruction as well as possible terminal ileitis. For somebody who has ongoing nausea for last 3 weeks as well as diarrhea for last 2 days patient's only electrolyte abnormality is potassium of 3.4.  With normal renal function as well as no ketones in the urine.  Conservative measures recommended by general surgery. Gastric tube will be inserted. Low intermittent suction. Patient remains NPO. IV fluid with LR. We will replace potassium.  Goal K more than 4. As needed Zofran as needed morphine. Given potential for terminal ileitis Empiric therapy with IV Unasyn for now. GI pathogen panel as well as C. difficile negative.  2.  Essential hypertension Blood pressure currently mildly elevated. As needed Lopressor. Holding Norvasc, losartan, HCTZ  3.  Type 2 diabetes mellitus, uncontrolled with HLD Holding Lipitor for now. Check hemoglobin A1c. On Metformin at home currently on hold. Sliding scale insulin every 4 hours insulin.  4.  History of prostate cancer. Urinary incontinence Patient is a Ditropan. We will currently hold Condom catheter.  5.  Cerebral palsy. Anxiety. Depression. Not on any medication at home. As needed Ativan.  6.  PVC on telemetry. Hypokalemia,  hypomagnesemia Continue to monitor on telemetry. Patient is DNR. No prior history no chest pain no palpitation. Correct electrolyte abnormality.  7.  Tachypnea. Likely anxiety induced as well as abdominal distention induced. Anticipating improvement with decompression with NG tube. Monitor. No adventitious sound on examination. If persistent may require chest x-ray. CT shows lung bases clear.  8.  Melanoma.  Patient has history of melanoma removal surgery from his nose certainly can pose a problem for placing NG tube. Will monitor. As needed Ativan.  Diet: N.p.o. DVT Prophylaxis:   enoxaparin (LOVENOX) injection 40 mg Start: 11/27/20 2200   Advance goals of care discussion: DNR  Family Communication: no family was present at bedside, at the time of interview.  The pt provided permission to discuss medical plan with the family. Opportunity was given to ask question and all questions were answered satisfactorily.   Disposition:  Status is: Inpatient  Remains inpatient appropriate because:IV treatments appropriate due to intensity of illness or inability to take PO and Inpatient level of care appropriate due to severity of illness  Dispo: The patient is from: Home              Anticipated d/c is to: Home              Anticipated d/c date is: > 3 days              Patient currently is not medically stable to d/c.  Subjective: No nausea no vomiting no fever no chills.  Continues to report abdominal distress as well as shortness of breath.  Has cough.  Tolerated NG tube insertion well this morning.  Significant output  Physical Exam:  General: Appear in moderate distress, no Rash; Oral Mucosa Clear, moist. no Abnormal Neck Mass Or lumps, Conjunctiva normal  Cardiovascular: S1 and S2 Present, no Murmur, Respiratory: increased respiratory effort, Bilateral Air entry present and bilateral  Crackles, no wheezes Abdomen: Bowel Sound present, Soft and diffuse mild  tenderness Extremities: trace Pedal edema Neurology: alert and oriented to time, place, and person affect appropriate. no new focal deficit Gait not checked due to patient safety concerns  Vitals:   11/28/20 1005 11/28/20 1031 11/28/20 1420 11/28/20 1854  BP: 124/66 140/81 140/72 (!) 152/84  Pulse: 84 86 73 87  Resp: 20 16 (!) 22 (!) 22  Temp: 98.3 F (36.8 C) (!) 97.4 F (36.3 C) 98 F (36.7 C) 98.7 F (37.1 C)  TempSrc: Oral Oral Oral Oral  SpO2: 94% 93% 93% 92%  Weight:      Height:        Intake/Output Summary (Last 24 hours) at 11/28/2020 1918 Last data filed at 11/28/2020 1700 Gross per 24 hour  Intake 2732.28 ml  Output --  Net 2732.28 ml   Filed Weights   11/27/20 1254  Weight: 85.3 kg    Data Reviewed: I have personally reviewed and interpreted daily labs, tele strips, imagings as discussed above. I reviewed all nursing notes, pharmacy notes, vitals, pertinent old records I have discussed plan of care as described above with RN and patient/family.  CBC: Recent Labs  Lab 11/27/20 1343 11/28/20 1111  WBC 3.1* 4.4  NEUTROABS 2.4  --   HGB 15.4 13.9  HCT 44.9 41.0  MCV 95.3 95.1  PLT 160 152   Basic Metabolic Panel: Recent Labs  Lab 11/27/20 1343 11/28/20 0315  NA 136 134*  K 3.4* 4.4  CL 100 102  CO2 23 21*  GLUCOSE 183* 163*  BUN 20 21  CREATININE 1.10 1.10  CALCIUM 9.1 7.9*  MG  --  1.2*    Studies: DG Abd Portable 1V  Result Date: 11/28/2020 CLINICAL DATA:  79 year old male NG tube placement. Small bowel obstruction versus ileus. EXAM: PORTABLE ABDOMEN - 1 VIEW COMPARISON:  CT Abdomen and Pelvis 11/27/2020. FINDINGS: Portable AP semi upright view at 0856 hours. Enteric tube placed into the stomach with side hole at the level of the gastric body. Continued left upper quadrant dilated small bowel loops roughly 45-50 mm. Bowel gas remains in the splenic flexure. Stable lung bases. No acute osseous abnormality identified. IMPRESSION: 1.  Enteric tube placed into the stomach, side hole the level of the gastric body. 2. Continued proximal small bowel dilatation. Electronically Signed   By: Odessa Fleming M.D.   On: 11/28/2020 09:05    Scheduled Meds: . enoxaparin (LOVENOX) injection  40 mg Subcutaneous Q24H  . insulin aspart  0-9 Units Subcutaneous Q4H   Continuous Infusions: . ampicillin-sulbactam (UNASYN) IV 3 g (11/28/20 1622)  . lactated ringers 75 mL/hr at 11/28/20 1125  . methocarbamol (ROBAXIN) IV     PRN Meds: acetaminophen **OR** acetaminophen, LORazepam, methocarbamol (ROBAXIN) IV, metoprolol tartrate, morphine injection, ondansetron **OR** ondansetron (ZOFRAN) IV  Time spent: 35 minutes  Author: Lynden Oxford, MD [facility] 11/28/2020 7:18 PM  To reach On-call, see care teams to locate the attending and reach out via [email or web address]. Between 7PM-7AM, please contact night-coverage If you still have difficulty reaching the attending provider, please page the [facility] (Director on Call) for [facility] on amion for assistance.

## 2020-11-28 NOTE — ED Notes (Signed)
Patient continuously calling out for water and ice. Patient has been made aware several times, by multiple staff members that he can not eat or drink anything due to the small bowel obstruction. Patient has also been being given PRN anti-emetic medication for nausea as requested.  VSS.  Will continue to monitor

## 2020-11-29 ENCOUNTER — Inpatient Hospital Stay (HOSPITAL_COMMUNITY): Payer: Medicare Other

## 2020-11-29 ENCOUNTER — Ambulatory Visit: Payer: Medicare Other

## 2020-11-29 DIAGNOSIS — K567 Ileus, unspecified: Secondary | ICD-10-CM | POA: Diagnosis not present

## 2020-11-29 LAB — COMPREHENSIVE METABOLIC PANEL
ALT: 34 U/L (ref 0–44)
AST: 22 U/L (ref 15–41)
Albumin: 3.1 g/dL — ABNORMAL LOW (ref 3.5–5.0)
Alkaline Phosphatase: 52 U/L (ref 38–126)
Anion gap: 11 (ref 5–15)
BUN: 24 mg/dL — ABNORMAL HIGH (ref 8–23)
CO2: 25 mmol/L (ref 22–32)
Calcium: 8.4 mg/dL — ABNORMAL LOW (ref 8.9–10.3)
Chloride: 103 mmol/L (ref 98–111)
Creatinine, Ser: 1.04 mg/dL (ref 0.61–1.24)
GFR, Estimated: >60 mL/min (ref >60)
Glucose, Bld: 123 mg/dL — ABNORMAL HIGH (ref 70–99)
Potassium: 3.5 mmol/L (ref 3.5–5.1)
Sodium: 139 mmol/L (ref 135–145)
Total Bilirubin: 0.7 mg/dL (ref 0.3–1.2)
Total Protein: 5.9 g/dL — ABNORMAL LOW (ref 6.5–8.1)

## 2020-11-29 LAB — GLUCOSE, CAPILLARY
Glucose-Capillary: 110 mg/dL — ABNORMAL HIGH (ref 70–99)
Glucose-Capillary: 101 mg/dL — ABNORMAL HIGH (ref 70–99)
Glucose-Capillary: 91 mg/dL (ref 70–99)
Glucose-Capillary: 99 mg/dL (ref 70–99)

## 2020-11-29 LAB — CBC WITH DIFFERENTIAL/PLATELET
Abs Immature Granulocytes: 0.07 10*3/uL (ref 0.00–0.07)
Basophils Absolute: 0 10*3/uL (ref 0.0–0.1)
Basophils Relative: 0 %
Eosinophils Absolute: 0.6 10*3/uL — ABNORMAL HIGH (ref 0.0–0.5)
Eosinophils Relative: 8 %
HCT: 41.6 % (ref 39.0–52.0)
Hemoglobin: 14.1 g/dL (ref 13.0–17.0)
Immature Granulocytes: 1 %
Lymphocytes Relative: 3 %
Lymphs Abs: 0.2 10*3/uL — ABNORMAL LOW (ref 0.7–4.0)
MCH: 32.9 pg (ref 26.0–34.0)
MCHC: 33.9 g/dL (ref 30.0–36.0)
MCV: 97 fL (ref 80.0–100.0)
Monocytes Absolute: 1.2 10*3/uL — ABNORMAL HIGH (ref 0.1–1.0)
Monocytes Relative: 17 %
Neutro Abs: 4.8 10*3/uL (ref 1.7–7.7)
Neutrophils Relative %: 71 %
Platelets: 160 10*3/uL (ref 150–400)
RBC: 4.29 MIL/uL (ref 4.22–5.81)
RDW: 13.4 % (ref 11.5–15.5)
WBC: 6.9 10*3/uL (ref 4.0–10.5)
nRBC: 0 % (ref 0.0–0.2)

## 2020-11-29 LAB — MAGNESIUM: Magnesium: 2.1 mg/dL (ref 1.7–2.4)

## 2020-11-29 MED ORDER — POTASSIUM CHLORIDE 10 MEQ/100ML IV SOLN
10.0000 meq | INTRAVENOUS | Status: AC
Start: 2020-11-29 — End: 2020-11-29
  Administered 2020-11-29 (×3): 10 meq via INTRAVENOUS
  Filled 2020-11-29 (×3): qty 100

## 2020-11-29 MED ORDER — MAGNESIUM SULFATE 4 GM/100ML IV SOLN
4.0000 g | Freq: Once | INTRAVENOUS | Status: AC
  Administered 2020-11-29: 09:00:00 4 g via INTRAVENOUS
  Filled 2020-11-29: qty 100

## 2020-11-29 NOTE — Progress Notes (Signed)
Progress Note: General Surgery Service   Chief Complaint/Subjective: No nausea, several bowel movements overnight, no abdominal pain  Objective: Vital signs in last 24 hours: Temp:  [97.4 F (36.3 C)-98.7 F (37.1 C)] 98.7 F (37.1 C) (12/27 1854) Pulse Rate:  [64-96] 64 (12/28 0610) Resp:  [16-23] 17 (12/28 0610) BP: (122-152)/(63-108) 135/63 (12/28 0610) SpO2:  [92 %-97 %] 97 % (12/28 0610) Weight:  [83.6 kg] 83.6 kg (12/28 0610) Last BM Date: 11/29/20  Intake/Output from previous day: 12/27 0701 - 12/28 0700 In: 1832.3 [I.V.:1532.3; IV Piggyback:300] Out: 300 [Urine:300] Intake/Output this shift: No intake/output data recorded.  Gen: NAD  Resp: nonlabored  Card: RRR  Abd: distended, nontender  Lab Results: CBC  Recent Labs    11/27/20 1343 11/28/20 1111  WBC 3.1* 4.4  HGB 15.4 13.9  HCT 44.9 41.0  PLT 160 152   BMET Recent Labs    11/27/20 1343 11/28/20 0315  NA 136 134*  K 3.4* 4.4  CL 100 102  CO2 23 21*  GLUCOSE 183* 163*  BUN 20 21  CREATININE 1.10 1.10  CALCIUM 9.1 7.9*   PT/INR No results for input(s): LABPROT, INR in the last 72 hours. ABG No results for input(s): PHART, HCO3 in the last 72 hours.  Invalid input(s): PCO2, PO2  Anti-infectives: Anti-infectives (From admission, onward)   Start     Dose/Rate Route Frequency Ordered Stop   11/28/20 0400  Ampicillin-Sulbactam (UNASYN) 3 g in sodium chloride 0.9 % 100 mL IVPB        3 g 200 mL/hr over 30 Minutes Intravenous Every 6 hours 11/27/20 2027     11/27/20 2030  Ampicillin-Sulbactam (UNASYN) 3 g in sodium chloride 0.9 % 100 mL IVPB        3 g 200 mL/hr over 30 Minutes Intravenous NOW 11/27/20 2027 11/27/20 2148   11/27/20 2000  cefTRIAXone (ROCEPHIN) 1 g in sodium chloride 0.9 % 100 mL IVPB  Status:  Discontinued        1 g 200 mL/hr over 30 Minutes Intravenous Every 24 hours 11/27/20 1956 11/27/20 1957   11/27/20 2000  metroNIDAZOLE (FLAGYL) IVPB 500 mg  Status:  Discontinued         500 mg 100 mL/hr over 60 Minutes Intravenous Every 8 hours 11/27/20 1956 11/27/20 1957      Medications: Scheduled Meds: . enoxaparin (LOVENOX) injection  40 mg Subcutaneous Q24H  . insulin aspart  0-9 Units Subcutaneous Q4H   Continuous Infusions: . ampicillin-sulbactam (UNASYN) IV 3 g (11/29/20 0001)  . lactated ringers 75 mL/hr at 11/28/20 2348  . methocarbamol (ROBAXIN) IV     PRN Meds:.acetaminophen **OR** acetaminophen, LORazepam, methocarbamol (ROBAXIN) IV, metoprolol tartrate, morphine injection, ondansetron **OR** ondansetron (ZOFRAN) IV  Assessment/Plan: 79 yo male with prostate cancer s/p radiation presents with nausea, vomiting, abdominal distension.  Ileus/pSBO - several bowel movements overnight, abdomen still distended, NG with 1 liter in canister, XR with large amount of gas in colon. Continue NG today, supportive care for diarrhea,  Hypomagnesia - replace magnesium  Prostate cancer - once enteritis resolves could probably resume treatment   LOS: 2 days   Rodman Pickle, MD [phone] [facility]

## 2020-11-29 NOTE — Progress Notes (Signed)
[facility] Progress Note  Patient: Jay Bailey    EPP:[id number]  DOA: 11/27/2020     Date of Service: the patient was seen and examined on 11/29/2020  Brief hospital course:  TAITUM ALMS is a 79 y.o. male with Past medical history of HTN, HLD, cerebral palsy, depression, type II DM, melanoma SP nasal surgery, prostate cancer SP prostatectomy with recently increased PSA SP radiation. Patient presents with complaints of nausea and diarrhea. Found to have a partial small bowel obstruction. Currently plan is conservative measures.  Assessment and Plan: 1.  Partial small bowel obstruction Possible terminal ileitis Diarrhea Nausea with dry heaves CT shows evidence of partial small bowel obstruction as well as possible terminal ileitis. Conservative measures recommended by general surgery. Continue gastric tube with low intermittent suction. Remains n.p.o. pretension IV fluid. Replace potassium replace magnesium as needed. Continue Zofran as well as as needed morphine. Continue IV Unasyn for terminal ileitis. C. difficile negative. GI pathogen panel negative.  2.  Essential hypertension Blood pressure currently mildly elevated. As needed Lopressor. Holding Norvasc, losartan, HCTZ  3.  Type 2 diabetes mellitus, uncontrolled with HLD Holding Lipitor for now. hemoglobin A1c 6.3 On Metformin at home currently on hold. Sliding scale insulin every 4 hours insulin.  4.  History of prostate cancer. Urinary incontinence Patient is a Ditropan. We will currently hold Condom catheter.  5.  Cerebral palsy. Anxiety. Depression. Not on any medication at home. As needed Ativan.  6.  PVC on telemetry. Hypokalemia, hypomagnesemia Continue to monitor on telemetry. Patient is DNR. No prior history no chest pain no palpitation. Correct electrolyte abnormality.  7.  Tachypnea. Likely anxiety induced as well as abdominal distention induced. CT shows lung bases  clear.  8.  Melanoma.  Patient has history of melanoma removal surgery from his nose certainly can pose a problem for placing NG tube. Will monitor. As needed Ativan.  Diet: N.p.o. DVT Prophylaxis:   enoxaparin (LOVENOX) injection 40 mg Start: 11/27/20 2200   Advance goals of care discussion: DNR  Family Communication: no family was present at bedside, at the time of interview.  The pt provided permission to discuss medical plan with the family. Opportunity was given to ask question and all questions were answered satisfactorily.   Disposition:  Status is: Inpatient  Remains inpatient appropriate because:IV treatments appropriate due to intensity of illness or inability to take PO and Inpatient level of care appropriate due to severity of illness  Dispo: The patient is from: Home              Anticipated d/c is to: Home              Anticipated d/c date is: > 3 days              Patient currently is not medically stable to d/c.  Subjective: Reports 3-4 times going to the bathroom although no BM documented.  Passing gas.  No fever or chills.  No abdominal pain.  Physical Exam:  General: Appear in mild distress, no Rash; Oral Mucosa Clear, moist. no Abnormal Neck Mass Or lumps, Conjunctiva normal  Cardiovascular: S1 and S2 Present, no Murmur, Respiratory: increased respiratory effort, Bilateral Air entry present and bilateral  Crackles, no wheezes Abdomen: Bowel Sound sluggish but present, Soft and no tenderness Extremities: trace Pedal edema Neurology: alert and oriented to time, place, and person affect appropriate. no new focal deficit Gait not checked due to patient safety concerns    Vitals:  11/28/20 1854 11/28/20 2237 11/29/20 0610 11/29/20 1328  BP: (!) 152/84 (!) 144/79 135/63 (!) 150/71  Pulse: 87 84 64 63  Resp: (!) 22 (!) 22 17 (!) 22  Temp: 98.7 F (37.1 C)   97.8 F (36.6 C)  TempSrc: Oral   Oral  SpO2: 92% 93% 97% 96%  Weight:   83.6 kg   Height:         Intake/Output Summary (Last 24 hours) at 11/29/2020 1935 Last data filed at 11/29/2020 1830 Gross per 24 hour  Intake 493.94 ml  Output 1575 ml  Net -1081.06 ml   Filed Weights   11/27/20 1254 11/29/20 0610  Weight: 85.3 kg 83.6 kg    Data Reviewed: I have personally reviewed and interpreted daily labs, tele strips, imagings as discussed above. I reviewed all nursing notes, pharmacy notes, vitals, pertinent old records I have discussed plan of care as described above with RN and patient/family.  CBC: Recent Labs  Lab 11/27/20 1343 11/28/20 1111 11/29/20 0856  WBC 3.1* 4.4 6.9  NEUTROABS 2.4  --  4.8  HGB 15.4 13.9 14.1  HCT 44.9 41.0 41.6  MCV 95.3 95.1 97.0  PLT 160 152 0000000   Basic Metabolic Panel: Recent Labs  Lab 11/27/20 1343 11/28/20 0315 11/29/20 0856  NA 136 134* 139  K 3.4* 4.4 3.5  CL 100 102 103  CO2 23 21* 25  GLUCOSE 183* 163* 123*  BUN 20 21 24*  CREATININE 1.10 1.10 1.04  CALCIUM 9.1 7.9* 8.4*  MG  --  1.2* 2.1    Studies: DG Abd 1 View  Result Date: 11/29/2020 CLINICAL DATA:  Nasogastric tube placement EXAM: ABDOMEN - 1 VIEW COMPARISON:  11/28/2020 FINDINGS: Nasogastric tube is seen extending into the expected mid body of the stomach. Multiple gas-filled loops of large and small bowel are seen throughout the abdomen suggestive of a diffuse ileus. No gross free intraperitoneal gas. Bilateral total hip arthroplasty has been performed. Bilateral inguinal hernia repair with mesh has been performed. IMPRESSION: Nasogastric tube within the expected mid body of the stomach. Electronically Signed   By: Fidela Salisbury MD   On: 11/29/2020 04:55    Scheduled Meds: . enoxaparin (LOVENOX) injection  40 mg Subcutaneous Q24H  . insulin aspart  0-9 Units Subcutaneous Q4H   Continuous Infusions: . ampicillin-sulbactam (UNASYN) IV 3 g (11/29/20 1646)  . lactated ringers 75 mL/hr at 11/28/20 2348  . methocarbamol (ROBAXIN) IV     PRN Meds: acetaminophen  **OR** acetaminophen, LORazepam, methocarbamol (ROBAXIN) IV, metoprolol tartrate, morphine injection, ondansetron **OR** ondansetron (ZOFRAN) IV  Time spent: 35 minutes  Author: Berle Mull, MD [facility] 11/29/2020 7:35 PM  To reach On-call, see care teams to locate the attending and reach out via [email or web address]. Between 7PM-7AM, please contact night-coverage If you still have difficulty reaching the attending provider, please page the [facility] (Director on Call) for [facility] on amion for assistance.

## 2020-11-29 NOTE — Evaluation (Signed)
Physical Therapy Evaluation Patient Details Name: Jay Bailey MRN: [id number] DOB: 05/03/1941 Today's Date: 11/29/2020   History of Present Illness  79 yo male admitted with partial SBO. Hx of prostate ca-undergoing radiation, DM.  Clinical Impression  On eval, pt was Min guard assist for mobility. He walked ~100 feet with a RW. He denied pain during session. He is eager to regain his PLOF. Discussed d/c plan-pt is adamant he does not want to d/c to SNF. He prefers to d/c home with [facility] services. Will follow and progress activity as tolerated.     Follow Up Recommendations Home health PT;Supervision - Intermittent (pt refuses SNF placement. So, recommend [facility].)    Equipment Recommendations  Rolling walker with 5" wheels    Recommendations for Other Services       Precautions / Restrictions Precautions Precautions: Fall Precaution Comments: NG tube; incontinent Restrictions Weight Bearing Restrictions: No      Mobility  Bed Mobility Overal bed mobility: Needs Assistance Bed Mobility: Supine to Sit     Supine to sit: Min guard;HOB elevated     General bed mobility comments: Increased time. Close guard for safety, lines. Cues for safety.    Transfers Overall transfer level: Needs assistance Equipment used: Rolling walker (2 wheeled);None Transfers: Sit to/from [employer] to Stand: Min guard Stand pivot transfers: Min guard       General transfer comment: Min guard for safety. Cues for hand placement.  Ambulation/Gait Ambulation/Gait assistance: Min guard Gait Distance (Feet): 100 Feet Assistive device: Rolling walker (2 wheeled) Gait Pattern/deviations: Step-through pattern;Decreased stride length     General Gait Details: Increased ER of L LE. Close Min guard assist. Pt tolerated distance well.  Stairs            Wheelchair Mobility    Modified Rankin (Stroke Patients Only)       Balance Overall balance  assessment: Needs assistance         Standing balance support: Bilateral upper extremity supported Standing balance-Leahy Scale: Poor                               Pertinent Vitals/Pain Pain Assessment: No/denies pain    Home Living Family/patient expects to be discharged to:: Private residence Living Arrangements: Alone Available Help at Discharge: Family Type of Home: Apartment Home Access: Level entry     Home Layout: One level Home Equipment: Kasandra Knudsen - single point      Prior Function Level of Independence: Independent with assistive device(s)         Comments: uses SPC at baseline     Hand Dominance        Extremity/Trunk Assessment   Upper Extremity Assessment Upper Extremity Assessment: Defer to OT evaluation    Lower Extremity Assessment Lower Extremity Assessment: Generalized weakness    Cervical / Trunk Assessment Cervical / Trunk Assessment: Normal  Communication   Communication: HOH  Cognition Arousal/Alertness: Awake/alert Behavior During Therapy: WFL for tasks assessed/performed Overall Cognitive Status: Within Functional Limits for tasks assessed                                        General Comments      Exercises     Assessment/Plan    PT Assessment Patient needs continued PT services  PT Problem List Decreased strength;Decreased  mobility;Decreased activity tolerance;Decreased balance       PT Treatment Interventions DME instruction;Gait training;Therapeutic activities;Therapeutic exercise;Patient/family education;Balance training;Functional mobility training    PT Goals (Current goals can be found in the Care Plan section)  Acute Rehab PT Goals Patient Stated Goal: to d/c home and regain PLOF PT Goal Formulation: With patient Time For Goal Achievement: 12/13/20 Potential to Achieve Goals: Good    Frequency Min 3X/week   Barriers to discharge Decreased caregiver support      Co-evaluation                AM-PAC PT "6 Clicks" Mobility  Outcome Measure Help needed turning from your back to your side while in a flat bed without using bedrails?: A Little Help needed moving from lying on your back to sitting on the side of a flat bed without using bedrails?: A Little Help needed moving to and from a bed to a chair (including a wheelchair)?: A Little Help needed standing up from a chair using your arms (e.g., wheelchair or bedside chair)?: A Little Help needed to walk in hospital room?: A Little Help needed climbing 3-5 steps with a railing? : A Lot 6 Click Score: 17    End of Session   Activity Tolerance: Patient tolerated treatment well Patient left: in chair;with call bell/phone within reach   PT Visit Diagnosis: Muscle weakness (generalized) (M62.81);Unsteadiness on feet (R26.81)    Time: 8832-5498 PT Time Calculation (min) (ACUTE ONLY): 31 min   Charges:   PT Evaluation $PT Eval Moderate Complexity: 1 Mod PT Treatments $Gait Training: 8-22 mins          Faye Ramsay, PT Acute Rehabilitation  Office: [phone] Pager: [phone]

## 2020-11-29 NOTE — Plan of Care (Signed)

## 2020-11-29 NOTE — Progress Notes (Signed)
Initial Nutrition Assessment  DOCUMENTATION CODES:   Not applicable  INTERVENTION:   -RD will follow for diet advancement and add supplements as appropriate  NUTRITION DIAGNOSIS:   Inadequate oral intake related to altered GI function as evidenced by NPO status.  GOAL:   Patient will meet greater than or equal to 90% of their needs  MONITOR:   Diet advancement,Labs,Weight trends,Skin,I & O's  REASON FOR ASSESSMENT:   Malnutrition Screening Tool    ASSESSMENT:   Jay Bailey is a 79 y.o. male with Past medical history of HTN, HLD, cerebral palsy, depression, type II DM, melanoma SP nasal surgery, prostate cancer SP prostatectomy with recently increased PSA SP radiation.  Pt admitted with partial SBO.   12/27- NGT placed, connected to low, intermittent suction  Reviewed I/O's: +1.5 L x 24 hours and +2.4 L since admission  UOP: 300 ml x 24 hours  Attempted to speak with pt via call to hospital room phone, however, unable to reach.   Per H&P, pt had been experiencing nausea, vomiting, and abdominal distention for 1.5 weeks PTA.   Pt currently NPO with NGT for decompression. Per general surgery notes, pt with abdominal distention and x-ray reveals large amount of gas in colon.   Reviewed wt hx; pt has experienced a 8.9% wt loss over the past 2 months, which is significant for time frame.   Lab Results  Component Value Date   HGBA1C 6.3 (H) 11/28/2020   PTA DM medications are 500 mg metformin daily.   Labs reviewed: CBGS: 110-117 (inpatient orders for glycemic control are 0-9 units insulin aspart every 4 hours).   Diet Order:   Diet Order            Diet NPO time specified Except for: Ice Chips  Diet effective now                 EDUCATION NEEDS:   No education needs have been identified at this time  Skin:  Skin Assessment: Reviewed RN Assessment  Last BM:  11/29/20  Height:   Ht Readings from Last 1 Encounters:  11/27/20 5\' 7"  (1.702 m)     Weight:   Wt Readings from Last 1 Encounters:  11/29/20 83.6 kg    Ideal Body Weight:  67.3 kg  BMI:  Body mass index is 28.88 kg/m.  Estimated Nutritional Needs:   Kcal:  2000-2200  Protein:  100-115 grams  Fluid:  > 2 L    12/01/20, RD, LDN, CDCES Registered Dietitian II Certified Diabetes Care and Education Specialist Please refer to [facility] for RD and/or RD on-call/weekend/after hours pager

## 2020-11-29 NOTE — Plan of Care (Signed)
  Problem: Education: Goal: Knowledge of General Education information will improve Description Including pain rating scale, medication(s)/side effects and non-pharmacologic comfort measures Outcome: Progressing   

## 2020-11-30 ENCOUNTER — Ambulatory Visit: Payer: Medicare Other

## 2020-11-30 DIAGNOSIS — R112 Nausea with vomiting, unspecified: Secondary | ICD-10-CM

## 2020-11-30 DIAGNOSIS — R197 Diarrhea, unspecified: Secondary | ICD-10-CM | POA: Diagnosis not present

## 2020-11-30 DIAGNOSIS — K567 Ileus, unspecified: Secondary | ICD-10-CM | POA: Diagnosis not present

## 2020-11-30 DIAGNOSIS — K56609 Unspecified intestinal obstruction, unspecified as to partial versus complete obstruction: Secondary | ICD-10-CM

## 2020-11-30 LAB — CBC WITH DIFFERENTIAL/PLATELET
Abs Immature Granulocytes: 0.09 10*3/uL — ABNORMAL HIGH (ref 0.00–0.07)
Basophils Absolute: 0.1 10*3/uL (ref 0.0–0.1)
Basophils Relative: 1 %
Eosinophils Absolute: 0.7 10*3/uL — ABNORMAL HIGH (ref 0.0–0.5)
Eosinophils Relative: 12 %
HCT: 34.6 % — ABNORMAL LOW (ref 39.0–52.0)
Hemoglobin: 11.8 g/dL — ABNORMAL LOW (ref 13.0–17.0)
Immature Granulocytes: 2 %
Lymphocytes Relative: 4 %
Lymphs Abs: 0.2 10*3/uL — ABNORMAL LOW (ref 0.7–4.0)
MCH: 33 pg (ref 26.0–34.0)
MCHC: 34.1 g/dL (ref 30.0–36.0)
MCV: 96.6 fL (ref 80.0–100.0)
Monocytes Absolute: 0.8 10*3/uL (ref 0.1–1.0)
Monocytes Relative: 14 %
Neutro Abs: 3.7 10*3/uL (ref 1.7–7.7)
Neutrophils Relative %: 67 %
Platelets: 156 10*3/uL (ref 150–400)
RBC: 3.58 MIL/uL — ABNORMAL LOW (ref 4.22–5.81)
RDW: 13.5 % (ref 11.5–15.5)
WBC: 5.6 10*3/uL (ref 4.0–10.5)
nRBC: 0 % (ref 0.0–0.2)

## 2020-11-30 LAB — COMPREHENSIVE METABOLIC PANEL
ALT: 30 U/L (ref 0–44)
AST: 20 U/L (ref 15–41)
Albumin: 2.8 g/dL — ABNORMAL LOW (ref 3.5–5.0)
Alkaline Phosphatase: 47 U/L (ref 38–126)
Anion gap: 8 (ref 5–15)
BUN: 19 mg/dL (ref 8–23)
CO2: 26 mmol/L (ref 22–32)
Calcium: 8 mg/dL — ABNORMAL LOW (ref 8.9–10.3)
Chloride: 106 mmol/L (ref 98–111)
Creatinine, Ser: 0.92 mg/dL (ref 0.61–1.24)
GFR, Estimated: >60 mL/min (ref >60)
Glucose, Bld: 103 mg/dL — ABNORMAL HIGH (ref 70–99)
Potassium: 3.1 mmol/L — ABNORMAL LOW (ref 3.5–5.1)
Sodium: 140 mmol/L (ref 135–145)
Total Bilirubin: 1 mg/dL (ref 0.3–1.2)
Total Protein: 5.2 g/dL — ABNORMAL LOW (ref 6.5–8.1)

## 2020-11-30 LAB — LACTIC ACID, PLASMA: Lactic Acid, Venous: 0.7 mmol/L (ref 0.5–1.9)

## 2020-11-30 LAB — GLUCOSE, CAPILLARY
Glucose-Capillary: 100 mg/dL — ABNORMAL HIGH (ref 70–99)
Glucose-Capillary: 114 mg/dL — ABNORMAL HIGH (ref 70–99)
Glucose-Capillary: 116 mg/dL — ABNORMAL HIGH (ref 70–99)
Glucose-Capillary: 117 mg/dL — ABNORMAL HIGH (ref 70–99)
Glucose-Capillary: 141 mg/dL — ABNORMAL HIGH (ref 70–99)
Glucose-Capillary: 89 mg/dL (ref 70–99)
Glucose-Capillary: 97 mg/dL (ref 70–99)

## 2020-11-30 LAB — MAGNESIUM: Magnesium: 2.5 mg/dL — ABNORMAL HIGH (ref 1.7–2.4)

## 2020-11-30 MED ORDER — POTASSIUM CHLORIDE CRYS ER 20 MEQ PO TBCR
40.0000 meq | EXTENDED_RELEASE_TABLET | Freq: Once | ORAL | Status: AC
  Administered 2020-11-30: 10:00:00 40 meq via ORAL
  Filled 2020-11-30: qty 2

## 2020-11-30 MED ORDER — POTASSIUM CHLORIDE 10 MEQ/100ML IV SOLN
10.0000 meq | INTRAVENOUS | Status: AC
Start: 2020-11-30 — End: 2020-11-30
  Administered 2020-11-30 (×3): 10 meq via INTRAVENOUS
  Filled 2020-11-30 (×3): qty 100

## 2020-11-30 MED ORDER — POTASSIUM CHLORIDE 10 MEQ/100ML IV SOLN
10.0000 meq | INTRAVENOUS | Status: AC
Start: 2020-11-30 — End: 2020-11-30
  Administered 2020-11-30 (×2): 10 meq via INTRAVENOUS
  Filled 2020-11-30 (×2): qty 100

## 2020-11-30 MED ORDER — PANTOPRAZOLE SODIUM 40 MG IV SOLR
40.0000 mg | INTRAVENOUS | Status: DC
  Administered 2020-11-30 – 2020-12-01 (×2): 40 mg via INTRAVENOUS
  Filled 2020-11-30 (×2): qty 40

## 2020-11-30 NOTE — Progress Notes (Signed)
Patient tolerated clear liq diet, advanced to full liq, patient frequently asking for ice cream, encouraged patient to slow down on intake, then patient C/O nausea PRN Zofran given.

## 2020-11-30 NOTE — Evaluation (Signed)
Occupational Therapy Evaluation Patient Details Name: Jay Bailey MRN: [id number] DOB: [birth year] Today's Date: 11/30/2020    History of Present Illness 79 yo male admitted with partial SBO. Hx of prostate ca-undergoing radiation, DM.   Clinical Impression   Jay Bailey is a 79 year old man who typically lives alone and is independent with assisted devices. Patient reports typically using AE for lower body ADLs and cane for ambulation. On evaluation presents with generalized weakness and decreased activity tolerance resulting in a decline in independence needing assistance for toileting and LB ADLs. Patient able to perform standing, BSC transfers and ambulation in hall with RW and min guard. Patient will benefit from skilled OT services while in hospital to improve deficits and learn compensatory strategies as needed in order to return PLOF.  Do not expect he will need OT services at discharge. Recommended shower chair for safety.    Follow Up Recommendations  No OT follow up    Equipment Recommendations  Tub/shower seat    Recommendations for Other Services       Precautions / Restrictions Precautions Precaution Comments: NG tube; incontinent Restrictions Weight Bearing Restrictions: No      Mobility Bed Mobility Overal bed mobility: Needs Assistance Bed Mobility: Supine to Sit;Sit to Supine     Supine to sit: Supervision;HOB elevated Sit to supine: Supervision   General bed mobility comments: Supervision for transfers. Assistance for lines.    Transfers Overall transfer level: Needs assistance Equipment used: Rolling walker (2 wheeled) Transfers: Sit to/from [facility] Sit to Stand: Min guard Stand pivot transfers: Min guard       General transfer comment: Min guard to stand and transfer to [facility] min guard to ambulate in room and approx 50 feet in hall.    Balance Overall balance assessment: Mild deficits observed, not formally tested                                          ADL either performed or assessed with clinical judgement   ADL Overall ADL's : Needs assistance/impaired Eating/Feeding: Independent   Grooming: Independent   Upper Body Bathing: Set up   Lower Body Bathing: Minimal assistance;Set up;Sit to/from stand   Upper Body Dressing : Set up   Lower Body Dressing: Moderate assistance;Sit to/from stand   Toilet Transfer: Min guard;RW;BSC   Toileting- Water quality scientist and Hygiene: Minimal assistance;Sit to/from stand               Vision Patient Visual Report: No change from baseline       Perception     Praxis      Pertinent Vitals/Pain Pain Assessment: No/denies pain     Hand Dominance Right   Extremity/Trunk Assessment Upper Extremity Assessment Upper Extremity Assessment: Overall WFL for tasks assessed   Lower Extremity Assessment Lower Extremity Assessment: Defer to PT evaluation   Cervical / Trunk Assessment Cervical / Trunk Assessment: Normal   Communication Communication Communication: HOH   Cognition Arousal/Alertness: Awake/alert Behavior During Therapy: WFL for tasks assessed/performed Overall Cognitive Status: Within Functional Limits for tasks assessed                                     General Comments       Exercises     Shoulder Instructions  Home Living Family/patient expects to be discharged to:: Private residence Living Arrangements: Alone Available Help at Discharge: Family Type of Home: Apartment Home Access: Level entry     Home Layout: One level     Bathroom Shower/Tub: Producer, television/film/video: Handicapped height     Home Equipment: Cane - single point;Adaptive equipment Adaptive Equipment: Reacher;Sock aid;Long-handled shoe horn        Prior Functioning/Environment Level of Independence: Independent with assistive device(s)        Comments: uses SPC at baseline         OT Problem List: Decreased activity tolerance;Impaired balance (sitting and/or standing);Decreased knowledge of use of DME or AE;Pain      OT Treatment/Interventions: Self-care/ADL training;Therapeutic exercise;DME and/or AE instruction;Patient/family education;Balance training;Therapeutic activities    OT Goals(Current goals can be found in the care plan section) Acute Rehab OT Goals Patient Stated Goal: to d/c home and regain PLOF OT Goal Formulation: With patient Time For Goal Achievement: 12/09/20 Potential to Achieve Goals: Good  OT Frequency: Min 2X/week   Barriers to D/C:            Co-evaluation              AM-PAC OT "6 Clicks" Daily Activity     Outcome Measure Help from another person eating meals?: None Help from another person taking care of personal grooming?: A Little Help from another person toileting, which includes using toliet, bedpan, or urinal?: A Little Help from another person bathing (including washing, rinsing, drying)?: A Little Help from another person to put on and taking off regular upper body clothing?: A Little Help from another person to put on and taking off regular lower body clothing?: A Lot 6 Click Score: 18   End of Session Equipment Utilized During Treatment: Engineer, water Communication:  (Patient requests cranberry juice)  Activity Tolerance: Patient tolerated treatment well Patient left: with call bell/phone within reach;with nursing/sitter in room (On BSC in care of CNA)  OT Visit Diagnosis: Muscle weakness (generalized) (M62.81)                Time: 9379-0240 OT Time Calculation (min): 20 min Charges:  OT General Charges $OT Visit: 1 Visit OT Evaluation $OT Eval Moderate Complexity: 1 Mod  Briony Parveen, OTR/L Acute Care Rehab Services  Office [phone] Pager: [phone]   Kelli Churn 11/30/2020, 3:54 PM

## 2020-11-30 NOTE — Progress Notes (Signed)
Pharmacy Antibiotic Note  Jay Bailey is a 79 y.o. male admitted on 11/27/2020 with nausea, loose watery diarrhea, abdominal pain/distension. CT abdomen shows evidence of partial small bowel obstruction as well as possible enteritis at ileum. Pharmacy has been consulted for Unasyn dosing.  Day #4 of abx.   Plan: Continue Unasyn 3g IV q6h Monitor renal function, clinical course Consider switch to Augmentin tomorrow if tolerates clears?  Height: 5\' 7"  (170.2 cm) Weight: 83.6 kg (184 lb 6.4 oz) IBW/kg (Calculated) : 66.1  Temp (24hrs), Avg:98.2 F (36.8 C), Min:97.8 F (36.6 C), Max:98.6 F (37 C)  Recent Labs  Lab 11/27/20 1343 11/27/20 1540 11/27/20 1934 11/28/20 0315 11/28/20 1111 11/29/20 0856 11/30/20 0507  WBC 3.1*  --   --   --  4.4 6.9 5.6  CREATININE 1.10  --   --  1.10  --  1.04 0.92  LATICACIDVEN  --  1.2 1.6  --   --   --  0.7    Estimated Creatinine Clearance: 69.5 mL/min (by C-G formula based on SCr of 0.92 mg/dL).    Allergies  Allergen Reactions  . Lisinopril     Other reaction(s): cough    Antimicrobials this admission: 12/26 Unasyn >>  Dose adjustments this admission: --  Microbiology results: 12/26 BCx: ngtd 12/26 GI panel: negative 12/26 C.diff PCR: negative  Thank you for allowing pharmacy to be a part of this patient's care.  1/27 11/30/2020 12:23 PM

## 2020-11-30 NOTE — Progress Notes (Addendum)
PROGRESS NOTE    Jay Bailey  [id number] DOB: 29-May-1941 DOA: 11/27/2020 PCP: Harlan Stains, MD   Brief Narrative: Jay Roys Weineris a 79 y.o.malewith Past medical history ofHTN, HLD, cerebral palsy, depression, type II DM, melanoma SP nasal surgery, prostate cancer SP prostatectomy with recently increased PSA SP radiation. Patient presents with complaints of nausea and diarrhea. Found to have a partial small bowel obstruction. Currently plan is conservative measures.  Assessment & Plan:   Active Problems:   Partial small bowel obstruction (HCC)    #1 partial small bowel obstruction/terminal ileitis/diarrhea CT evidence of partial SBO as well as terminal ileitis.  NG tube still in place followed by general surgery.  Planning for clamping and clear liquids today and DC NG tube if he is doing good.  Continue as needed Zofran continue Unasyn for ileitis.  C. difficile negative.  #2 history of essential hypertension on as needed Lopressor restart home meds once he is able to take p.o.  He was on Norvasc losartan and HCTZ at home.  #3 history of uncontrolled type 2 diabetes with hyperlipidemia was on Lipitor at home with Metformin.  Restart when able to take p.o.  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  Not on any meds at home.  #6 PVCs on telemetry due to hypokalemia and hypomagnesemia replete and recheck.  K is 3.1 repleted.  Recheck in a.m.  #7 goals of care patient is DNR on admission with history of cerebral palsy.  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  Nutrition Problem: Inadequate oral intake Etiology: altered GI function   Signs/Symptoms: NPO status  Interventions: Refer to RD note for recommendations  Estimated body mass index is 28.88 kg/m as calculated from the following:   Height as of this encounter: 5\' 7"  (1.702 m).   Weight as of  this encounter: 83.6 kg.  DVT prophylaxis:  Code Status: DNR Family Communication: None at bedside Disposition Plan:  Status is: Inpatient   Dispo: The patient is from: Home              Anticipated d/c is to: Home              Anticipated d/c date is: 3 days              Patient currently is not medically stable to d/c.   Consultants: None  Procedures: NG tube Antimicrobials: Unasyn  Subjective: Patient resting in bed awake and alert feels belly is better passing gas still with NG tube in place  Objective: Vitals:   11/29/20 1328 11/29/20 2029 11/30/20 0524 11/30/20 1259  BP: (!) 150/71 (!) 141/76 136/78   Pulse: 63 69 (!) 59 61  Resp: (!) 22 18 18 20   Temp: 97.8 F (36.6 C) 98.6 F (37 C) 98.3 F (36.8 C) (!) 97.4 F (36.3 C)  TempSrc: Oral   Oral  SpO2: 96% 98% 93% 97%  Weight:      Height:        Intake/Output Summary (Last 24 hours) at 11/30/2020 1604 Last data filed at 11/30/2020 1500 Gross per 24 hour  Intake 5801.46 ml  Output 1801 ml  Net 4000.46 ml   Filed Weights   11/27/20 1254 11/29/20 0610  Weight: 85.3 kg 83.6 kg    Examination:  General exam: Appears mild distress due to NG tube Respiratory system: Clear to auscultation. Respiratory effort normal. Cardiovascular system: S1 &  S2 heard, RRR. No JVD, murmurs, rubs, gallops or clicks. No pedal edema. Gastrointestinal system: Abdomen is distended, soft and mild tender. No organomegaly or masses felt. Normal bowel sounds heard. Central nervous system: Alert and oriented. No focal neurological deficits. Extremities: No edema Skin: No rashes, lesions or ulcers Psychiatry: Judgement and insight appear normal. Mood & affect appropriate.     Data Reviewed: I have personally reviewed following labs and imaging studies  CBC: Recent Labs  Lab 11/27/20 1343 11/28/20 1111 11/29/20 0856 11/30/20 0507  WBC 3.1* 4.4 6.9 5.6  NEUTROABS 2.4  --  4.8 3.7  HGB 15.4 13.9 14.1 11.8*  HCT 44.9 41.0  41.6 34.6*  MCV 95.3 95.1 97.0 96.6  PLT 160 152 160 [id number]   Basic Metabolic Panel: Recent Labs  Lab 11/27/20 1343 11/28/20 0315 11/29/20 0856 11/30/20 0507  NA 136 134* 139 140  K 3.4* 4.4 3.5 3.1*  CL 100 102 103 106  CO2 23 21* 25 26  GLUCOSE 183* 163* 123* 103*  BUN 20 21 24* 19  CREATININE 1.10 1.10 1.04 0.92  CALCIUM 9.1 7.9* 8.4* 8.0*  MG  --  1.2* 2.1 2.5*   GFR: Estimated Creatinine Clearance: 69.5 mL/min (by C-G formula based on SCr of 0.92 mg/dL). Liver Function Tests: Recent Labs  Lab 11/27/20 1343 11/28/20 0315 11/29/20 0856 11/30/20 0507  AST 18 19 22 20   ALT 37 29 34 30  ALKPHOS 67 46 52 47  BILITOT 1.0 0.9 0.7 1.0  PROT 7.6 6.0* 5.9* 5.2*  ALBUMIN 4.1 3.2* 3.1* 2.8*   Recent Labs  Lab 11/27/20 1343  LIPASE 18   No results for input(s): AMMONIA in the last 168 hours. Coagulation Profile: No results for input(s): INR, PROTIME in the last 168 hours. Cardiac Enzymes: No results for input(s): CKTOTAL, CKMB, CKMBINDEX, TROPONINI in the last 168 hours. BNP (last 3 results) No results for input(s): PROBNP in the last 8760 hours. HbA1C: Recent Labs    11/28/20 1111  HGBA1C 6.3*   CBG: Recent Labs  Lab 11/29/20 2027 11/30/20 0025 11/30/20 0358 11/30/20 0720 11/30/20 1145  GLUCAP 99 97 100* 89 117*   Lipid Profile: No results for input(s): CHOL, HDL, LDLCALC, TRIG, CHOLHDL, LDLDIRECT in the last 72 hours. Thyroid Function Tests: No results for input(s): TSH, T4TOTAL, FREET4, T3FREE, THYROIDAB in the last 72 hours. Anemia Panel: No results for input(s): VITAMINB12, FOLATE, FERRITIN, TIBC, IRON, RETICCTPCT in the last 72 hours. Sepsis Labs: Recent Labs  Lab 11/27/20 1540 11/27/20 1934 11/30/20 0507  LATICACIDVEN 1.2 1.6 0.7    Recent Results (from the past 240 hour(s))  Resp Panel by RT-PCR (Flu A&B, Covid) Nasopharyngeal Swab     Status: None   Collection Time: 11/27/20  1:43 PM   Specimen: Nasopharyngeal Swab; Nasopharyngeal(NP)  swabs in vial transport medium  Result Value Ref Range Status   SARS Coronavirus 2 by RT PCR NEGATIVE NEGATIVE Final    Comment: (NOTE) SARS-CoV-2 target nucleic acids are NOT DETECTED.  The SARS-CoV-2 RNA is generally detectable in upper respiratory specimens during the acute phase of infection. The lowest concentration of SARS-CoV-2 viral copies this assay can detect is 138 copies/mL. A negative result does not preclude SARS-Cov-2 infection and should not be used as the sole basis for treatment or other patient management decisions. A negative result may occur with  improper specimen collection/handling, submission of specimen other than nasopharyngeal swab, presence of viral mutation(s) within the areas targeted by this assay, and inadequate number  of viral copies(<138 copies/mL). A negative result must be combined with clinical observations, patient history, and epidemiological information. The expected result is Negative.  Fact Sheet for Patients:  BloggerCourse.com  Fact Sheet for Healthcare Providers:  SeriousBroker.it  This test is no t yet approved or cleared by the [place] FDA and  has been authorized for detection and/or diagnosis of SARS-CoV-2 by FDA under an Emergency Use Authorization (EUA). This EUA will remain  in effect (meaning this test can be used) for the duration of the COVID-19 declaration under Section 564(b)(1) of the Act, 21 U.S.C.section 360bbb-3(b)(1), unless the authorization is terminated  or revoked sooner.       Influenza A by PCR NEGATIVE NEGATIVE Final   Influenza B by PCR NEGATIVE NEGATIVE Final    Comment: (NOTE) The Xpert Xpress SARS-CoV-2/FLU/RSV plus assay is intended as an aid in the diagnosis of influenza from Nasopharyngeal swab specimens and should not be used as a sole basis for treatment. Nasal washings and aspirates are unacceptable for Xpert Xpress  SARS-CoV-2/FLU/RSV testing.  Fact Sheet for Patients: BloggerCourse.com  Fact Sheet for Healthcare Providers: SeriousBroker.it  This test is not yet approved or cleared by the [place] FDA and has been authorized for detection and/or diagnosis of SARS-CoV-2 by FDA under an Emergency Use Authorization (EUA). This EUA will remain in effect (meaning this test can be used) for the duration of the COVID-19 declaration under Section 564(b)(1) of the Act, 21 U.S.C. section 360bbb-3(b)(1), unless the authorization is terminated or revoked.  Performed at [facility], [address], [place], [place] [address]   Blood culture (routine x 2)     Status: None (Preliminary result)   Collection Time: 11/27/20  7:33 PM   Specimen: BLOOD  Result Value Ref Range Status   Specimen Description   Final    BLOOD SITE NOT SPECIFIED Performed at [facility], [address] [place], [place] [address]    Special Requests   Final    BOTTLES DRAWN AEROBIC AND ANAEROBIC Blood Culture adequate volume Performed at [facility] [address] [place], [place] [address]    Culture   Final    NO GROWTH 2 DAYS Performed at [facility], [address], [place], [place] [address]    Report Status PENDING  Incomplete  Gastrointestinal Panel by PCR , Stool     Status: None   Collection Time: 11/27/20  7:40 PM   Specimen: Stool  Result Value Ref Range Status   Campylobacter species NOT DETECTED NOT DETECTED Final   Plesimonas shigelloides NOT DETECTED NOT DETECTED Final   Salmonella species NOT DETECTED NOT DETECTED Final   Yersinia enterocolitica NOT DETECTED NOT DETECTED Final   Vibrio species NOT DETECTED NOT DETECTED Final   Vibrio cholerae NOT DETECTED NOT DETECTED Final   Enteroaggregative E coli (EAEC) NOT DETECTED NOT DETECTED Final   Enteropathogenic E coli (EPEC) NOT DETECTED NOT DETECTED  Final   Enterotoxigenic E coli (ETEC) NOT DETECTED NOT DETECTED Final   Shiga like toxin producing E coli (STEC) NOT DETECTED NOT DETECTED Final   Shigella/Enteroinvasive E coli (EIEC) NOT DETECTED NOT DETECTED Final   Cryptosporidium NOT DETECTED NOT DETECTED Final   Cyclospora cayetanensis NOT DETECTED NOT DETECTED Final   Entamoeba histolytica NOT DETECTED NOT DETECTED Final   Giardia lamblia NOT DETECTED NOT DETECTED Final   Adenovirus F40/41 NOT DETECTED NOT DETECTED Final   Astrovirus NOT DETECTED NOT DETECTED Final  Norovirus GI/GII NOT DETECTED NOT DETECTED Final   Rotavirus A NOT DETECTED NOT DETECTED Final   Sapovirus (I, II, IV, and V) NOT DETECTED NOT DETECTED Final    Comment: Performed at [facility], [address], [place], [place] [address]  C Difficile Quick Screen w PCR reflex     Status: None   Collection Time: 11/27/20  7:40 PM   Specimen: Stool  Result Value Ref Range Status   C Diff antigen NEGATIVE NEGATIVE Final   C Diff toxin NEGATIVE NEGATIVE Final   C Diff interpretation No C. difficile detected.  Final    Comment: Performed at [facility], [place] [address] [place], [place] [address]         Radiology Studies: DG Abd 1 View  Result Date: 11/29/2020 CLINICAL DATA:  Nasogastric tube placement EXAM: ABDOMEN - 1 VIEW COMPARISON:  11/28/2020 FINDINGS: Nasogastric tube is seen extending into the expected mid body of the stomach. Multiple gas-filled loops of large and small bowel are seen throughout the abdomen suggestive of a diffuse ileus. No gross free intraperitoneal gas. Bilateral total hip arthroplasty has been performed. Bilateral inguinal hernia repair with mesh has been performed. IMPRESSION: Nasogastric tube within the expected mid body of the stomach. Electronically Signed   By: Fidela Salisbury MD   On: 11/29/2020 04:55        Scheduled Meds: . enoxaparin (LOVENOX) injection  40 mg Subcutaneous Q24H  . insulin  aspart  0-9 Units Subcutaneous Q4H  . pantoprazole (PROTONIX) IV  40 mg Intravenous Q24H   Continuous Infusions: . ampicillin-sulbactam (UNASYN) IV 3 g (11/30/20 1016)  . lactated ringers 75 mL/hr at 11/30/20 [id number]  . methocarbamol (ROBAXIN) IV       LOS: 3 days      Georgette Shell, MD 11/30/2020, 4:04 PM

## 2020-11-30 NOTE — Progress Notes (Signed)
Following update emailed to Dr. Margaretmary Dys and Marcello Fennel, PA-C.    Patient wishes to cancel treatment today and tomorrow too. Just wanting to ensure everyone is aware. Jay Bailey ([id number]) Admitted 11/27/2020 Not treated with radiation since 11/24/2020 on L3

## 2020-11-30 NOTE — Progress Notes (Signed)
Progress Note     Subjective: Patient reports some mild abdominal pain this AM. Denies nausea. Reports he had more bowel function overnight.   Objective: Vital signs in last 24 hours: Temp:  [97.8 F (36.6 C)-98.6 F (37 C)] 98.3 F (36.8 C) (12/29 0524) Pulse Rate:  [59-69] 59 (12/29 0524) Resp:  [18-22] 18 (12/29 0524) BP: (136-150)/(71-78) 136/78 (12/29 0524) SpO2:  [93 %-98 %] 93 % (12/29 0524) Last BM Date: 11/29/20  Intake/Output from previous day: 12/28 0701 - 12/29 0700 In: 3476.9 [P.O.:100; I.V.:2582.9; IV Piggyback:793.9] Out: 1376 [Urine:475; Emesis/NG output:900; Stool:1] Intake/Output this shift: No intake/output data recorded.  PE: General: pleasant, WD, chronically ill appearing male who is laying in bed in NAD Heart: regular, rate, and rhythm. Palpable radial and pedal pulses bilaterally Lungs: CTAB, no wheezes, rhonchi, or rales noted.  Respiratory effort nonlabored Abd: soft, NT, ND, +BS, NGT with bloody appearing drainage     Lab Results:  Recent Labs    11/29/20 0856 11/30/20 0507  WBC 6.9 5.6  HGB 14.1 11.8*  HCT 41.6 34.6*  PLT 160 156   BMET Recent Labs    11/29/20 0856 11/30/20 0507  NA 139 140  K 3.5 3.1*  CL 103 106  CO2 25 26  GLUCOSE 123* 103*  BUN 24* 19  CREATININE 1.04 0.92  CALCIUM 8.4* 8.0*   PT/INR No results for input(s): LABPROT, INR in the last 72 hours. CMP     Component Value Date/Time   NA 140 11/30/2020 0507   K 3.1 (L) 11/30/2020 0507   CL 106 11/30/2020 0507   CO2 26 11/30/2020 0507   GLUCOSE 103 (H) 11/30/2020 0507   BUN 19 11/30/2020 0507   CREATININE 0.92 11/30/2020 0507   CALCIUM 8.0 (L) 11/30/2020 0507   PROT 5.2 (L) 11/30/2020 0507   ALBUMIN 2.8 (L) 11/30/2020 0507   AST 20 11/30/2020 0507   ALT 30 11/30/2020 0507   ALKPHOS 47 11/30/2020 0507   BILITOT 1.0 11/30/2020 0507   GFRNONAA >60 11/30/2020 0507   GFRAA 48 (L) 02/28/2020 0014   Lipase     Component Value Date/Time   LIPASE 18  11/27/2020 1343       Studies/Results: DG Abd 1 View  Result Date: 11/29/2020 CLINICAL DATA:  Nasogastric tube placement EXAM: ABDOMEN - 1 VIEW COMPARISON:  11/28/2020 FINDINGS: Nasogastric tube is seen extending into the expected mid body of the stomach. Multiple gas-filled loops of large and small bowel are seen throughout the abdomen suggestive of a diffuse ileus. No gross free intraperitoneal gas. Bilateral total hip arthroplasty has been performed. Bilateral inguinal hernia repair with mesh has been performed. IMPRESSION: Nasogastric tube within the expected mid body of the stomach. Electronically Signed   By: Helyn Numbers MD   On: 11/29/2020 04:55   DG Abd Portable 1V  Result Date: 11/28/2020 CLINICAL DATA:  79 year old male NG tube placement. Small bowel obstruction versus ileus. EXAM: PORTABLE ABDOMEN - 1 VIEW COMPARISON:  CT Abdomen and Pelvis 11/27/2020. FINDINGS: Portable AP semi upright view at 0856 hours. Enteric tube placed into the stomach with side hole at the level of the gastric body. Continued left upper quadrant dilated small bowel loops roughly 45-50 mm. Bowel gas remains in the splenic flexure. Stable lung bases. No acute osseous abnormality identified. IMPRESSION: 1. Enteric tube placed into the stomach, side hole the level of the gastric body. 2. Continued proximal small bowel dilatation. Electronically Signed   By: Althea Grimmer.D.  On: 11/28/2020 09:05    Anti-infectives: Anti-infectives (From admission, onward)   Start     Dose/Rate Route Frequency Ordered Stop   11/28/20 0400  Ampicillin-Sulbactam (UNASYN) 3 g in sodium chloride 0.9 % 100 mL IVPB        3 g 200 mL/hr over 30 Minutes Intravenous Every 6 hours 11/27/20 2027     11/27/20 2030  Ampicillin-Sulbactam (UNASYN) 3 g in sodium chloride 0.9 % 100 mL IVPB        3 g 200 mL/hr over 30 Minutes Intravenous NOW 11/27/20 2027 11/27/20 2148   11/27/20 2000  cefTRIAXone (ROCEPHIN) 1 g in sodium chloride 0.9 % 100  mL IVPB  Status:  Discontinued        1 g 200 mL/hr over 30 Minutes Intravenous Every 24 hours 11/27/20 1956 11/27/20 1957   11/27/20 2000  metroNIDAZOLE (FLAGYL) IVPB 500 mg  Status:  Discontinued        500 mg 100 mL/hr over 60 Minutes Intravenous Every 8 hours 11/27/20 1956 11/27/20 1957       Assessment/Plan Prostate CA s/p radiation  Melanoma HTN HLD T2DM Hard of hearing  Ileus vs PSBO - more likely ileus secondary to radiation enteritis - having bowel function - clamp NGT and allow CLD today, possibly remove NGT this afternoon if tol clamping - goal of K>4.0 and Mg>2.0 to optimize bowel function  - mobilize as tolerated  FEN: CLD, NGT clamped VTE: lovenox ID: Unasyn 12/27>>  LOS: 3 days    Norm Parcel , [facility] 11/30/2020, 8:58 AM Please see Amion for pager number during day hours 7:00am-4:30pm

## 2020-12-01 ENCOUNTER — Ambulatory Visit: Payer: Medicare Other

## 2020-12-01 DIAGNOSIS — R112 Nausea with vomiting, unspecified: Secondary | ICD-10-CM | POA: Diagnosis not present

## 2020-12-01 DIAGNOSIS — R197 Diarrhea, unspecified: Secondary | ICD-10-CM | POA: Diagnosis not present

## 2020-12-01 DIAGNOSIS — K567 Ileus, unspecified: Secondary | ICD-10-CM | POA: Diagnosis not present

## 2020-12-01 LAB — CBC
HCT: 33.6 % — ABNORMAL LOW (ref 39.0–52.0)
Hemoglobin: 11.3 g/dL — ABNORMAL LOW (ref 13.0–17.0)
MCH: 32.5 pg (ref 26.0–34.0)
MCHC: 33.6 g/dL (ref 30.0–36.0)
MCV: 96.6 fL (ref 80.0–100.0)
Platelets: 138 10*3/uL — ABNORMAL LOW (ref 150–400)
RBC: 3.48 MIL/uL — ABNORMAL LOW (ref 4.22–5.81)
RDW: 13.7 % (ref 11.5–15.5)
WBC: 6 10*3/uL (ref 4.0–10.5)
nRBC: 0 % (ref 0.0–0.2)

## 2020-12-01 LAB — BASIC METABOLIC PANEL
Anion gap: 7 (ref 5–15)
BUN: 9 mg/dL (ref 8–23)
CO2: 25 mmol/L (ref 22–32)
Calcium: 7.9 mg/dL — ABNORMAL LOW (ref 8.9–10.3)
Chloride: 103 mmol/L (ref 98–111)
Creatinine, Ser: 0.7 mg/dL (ref 0.61–1.24)
GFR, Estimated: >60 mL/min (ref >60)
Glucose, Bld: 128 mg/dL — ABNORMAL HIGH (ref 70–99)
Potassium: 3.5 mmol/L (ref 3.5–5.1)
Sodium: 135 mmol/L (ref 135–145)

## 2020-12-01 LAB — GLUCOSE, CAPILLARY
Glucose-Capillary: 121 mg/dL — ABNORMAL HIGH (ref 70–99)
Glucose-Capillary: 123 mg/dL — ABNORMAL HIGH (ref 70–99)
Glucose-Capillary: 123 mg/dL — ABNORMAL HIGH (ref 70–99)
Glucose-Capillary: 127 mg/dL — ABNORMAL HIGH (ref 70–99)
Glucose-Capillary: 139 mg/dL — ABNORMAL HIGH (ref 70–99)
Glucose-Capillary: 163 mg/dL — ABNORMAL HIGH (ref 70–99)

## 2020-12-01 LAB — MAGNESIUM: Magnesium: 1.8 mg/dL (ref 1.7–2.4)

## 2020-12-01 MED ORDER — PANTOPRAZOLE SODIUM 40 MG PO TBEC
40.0000 mg | DELAYED_RELEASE_TABLET | Freq: Every day | ORAL | Status: DC
  Administered 2020-12-02: 40 mg via ORAL
  Filled 2020-12-01: qty 1

## 2020-12-01 MED ORDER — GUAIFENESIN 100 MG/5ML PO SOLN
10.0000 mL | Freq: Four times a day (QID) | ORAL | Status: DC | PRN
  Administered 2020-12-01 (×2): 200 mg via ORAL
  Filled 2020-12-01 (×2): qty 10

## 2020-12-01 MED ORDER — AMOXICILLIN-POT CLAVULANATE 875-125 MG PO TABS
1.0000 | ORAL_TABLET | Freq: Two times a day (BID) | ORAL | Status: DC
  Administered 2020-12-01 – 2020-12-02 (×3): 1 via ORAL
  Filled 2020-12-01 (×3): qty 1

## 2020-12-01 MED ORDER — MAGNESIUM SULFATE 2 GM/50ML IV SOLN
2.0000 g | Freq: Once | INTRAVENOUS | Status: AC
  Administered 2020-12-01: 13:00:00 2 g via INTRAVENOUS
  Filled 2020-12-01: qty 50

## 2020-12-01 MED ORDER — POTASSIUM CHLORIDE CRYS ER 20 MEQ PO TBCR
40.0000 meq | EXTENDED_RELEASE_TABLET | Freq: Once | ORAL | Status: AC
  Administered 2020-12-01: 13:00:00 40 meq via ORAL
  Filled 2020-12-01: qty 2

## 2020-12-01 NOTE — Progress Notes (Signed)
Progress Note     Subjective: Patient denies abdominal pain this AM. Tolerating FLD and having bowel function. Intermittent nausea but not emesis.  Objective: Vital signs in last 24 hours: Temp:  [97.4 F (36.3 C)-98.1 F (36.7 C)] 98.1 F (36.7 C) (12/30 0433) Pulse Rate:  [54-61] 54 (12/30 0433) Resp:  [18-20] 18 (12/30 0433) BP: (150-170)/(71-79) 150/71 (12/30 0433) SpO2:  [94 %-97 %] 94 % (12/30 0433) Weight:  [85 kg] 85 kg (12/30 0433) Last BM Date: 11/30/20  Intake/Output from previous day: 12/29 0701 - 12/30 0700 In: 3788.5 [P.O.:2280; I.V.:808.5; IV Piggyback:700] Out: 425 [Urine:425] Intake/Output this shift: No intake/output data recorded.  PE: General: pleasant, WD, chronically ill appearing male who is laying in bed in NAD Heart: regular, rate, and rhythm. Palpable radial and pedal pulses bilaterally Lungs: CTAB, no wheezes, rhonchi, or rales noted.  Respiratory effort nonlabored Abd: soft, NT, ND, +BS   Lab Results:  Recent Labs    11/30/20 0507 12/01/20 0523  WBC 5.6 6.0  HGB 11.8* 11.3*  HCT 34.6* 33.6*  PLT 156 138*   BMET Recent Labs    11/30/20 0507 12/01/20 0523  NA 140 135  K 3.1* 3.5  CL 106 103  CO2 26 25  GLUCOSE 103* 128*  BUN 19 9  CREATININE 0.92 0.70  CALCIUM 8.0* 7.9*   PT/INR No results for input(s): LABPROT, INR in the last 72 hours. CMP     Component Value Date/Time   NA 135 12/01/2020 0523   K 3.5 12/01/2020 0523   CL 103 12/01/2020 0523   CO2 25 12/01/2020 0523   GLUCOSE 128 (H) 12/01/2020 0523   BUN 9 12/01/2020 0523   CREATININE 0.70 12/01/2020 0523   CALCIUM 7.9 (L) 12/01/2020 0523   PROT 5.2 (L) 11/30/2020 0507   ALBUMIN 2.8 (L) 11/30/2020 0507   AST 20 11/30/2020 0507   ALT 30 11/30/2020 0507   ALKPHOS 47 11/30/2020 0507   BILITOT 1.0 11/30/2020 0507   GFRNONAA >60 12/01/2020 0523   GFRAA 48 (L) 02/28/2020 0014   Lipase     Component Value Date/Time   LIPASE 18 11/27/2020 1343        Studies/Results: No results found.  Anti-infectives: Anti-infectives (From admission, onward)   Start     Dose/Rate Route Frequency Ordered Stop   11/28/20 0400  Ampicillin-Sulbactam (UNASYN) 3 g in sodium chloride 0.9 % 100 mL IVPB        3 g 200 mL/hr over 30 Minutes Intravenous Every 6 hours 11/27/20 2027     11/27/20 2030  Ampicillin-Sulbactam (UNASYN) 3 g in sodium chloride 0.9 % 100 mL IVPB        3 g 200 mL/hr over 30 Minutes Intravenous NOW 11/27/20 2027 11/27/20 2148   11/27/20 2000  cefTRIAXone (ROCEPHIN) 1 g in sodium chloride 0.9 % 100 mL IVPB  Status:  Discontinued        1 g 200 mL/hr over 30 Minutes Intravenous Every 24 hours 11/27/20 1956 11/27/20 1957   11/27/20 2000  metroNIDAZOLE (FLAGYL) IVPB 500 mg  Status:  Discontinued        500 mg 100 mL/hr over 60 Minutes Intravenous Every 8 hours 11/27/20 1956 11/27/20 1957       Assessment/Plan Prostate CA s/p radiation  Melanoma HTN HLD T2DM Hard of hearing  Ileus vs PSBO - 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 General surgery will sign off, please call if we can be of further assistance  FEN: soft diet  VTE: lovenox ID: Unasyn 12/27>>  LOS: 4 days    Norm Parcel , [facility] 12/01/2020, 8:42 AM Please see Amion for pager number during day hours 7:00am-4:30pm

## 2020-12-01 NOTE — Progress Notes (Signed)
Physical Therapy Treatment Patient Details Name: JAZION SIPLIN MRN: [id number] DOB: 08-14-1941 Today's Date: 12/01/2020    History of Present Illness 79 yo male admitted with partial SBO. Hx of prostate ca-undergoing radiation, DM.    PT Comments    Assisted OOB to [facility] then amb in hallway.  Tolerated well and hopes to go home soon.   Follow Up Recommendations  Home health PT;Supervision - Intermittent     Equipment Recommendations  Rolling walker with 5" wheels    Recommendations for Other Services       Precautions / Restrictions Precautions Precautions: Fall Precaution Comments: incont    Mobility  Bed Mobility Overal bed mobility: Needs Assistance Bed Mobility: Supine to Sit       Sit to supine: Supervision   General bed mobility comments: Supervision for transfers. Self able.  Assistance for lines.  Transfers Overall transfer level: Needs assistance Equipment used: Rolling walker (2 wheeled);None Transfers: Sit to/from [employer] to Stand: Supervision Stand pivot transfers: Supervision       General transfer comment: Supervision for safety and BSC set up  Ambulation/Gait Ambulation/Gait assistance: Supervision;Min guard Gait Distance (Feet): 110 Feet Assistive device: Rolling walker (2 wheeled) Gait Pattern/deviations: Step-through pattern;Decreased stride length Gait velocity: WFL   General Gait Details: Supervision just for environmental safety tolerated distance well   Stairs             Wheelchair Mobility    Modified Rankin (Stroke Patients Only)       Balance                                            Cognition Arousal/Alertness: Awake/alert Behavior During Therapy: WFL for tasks assessed/performed Overall Cognitive Status: Within Functional Limits for tasks assessed                                 General Comments: AxO x 2 pleasant HOH      Exercises       General Comments        Pertinent Vitals/Pain Pain Assessment: No/denies pain    Home Living                      Prior Function            PT Goals (current goals can now be found in the care plan section) Progress towards PT goals: Progressing toward goals    Frequency    Min 3X/week      PT Plan Current plan remains appropriate    Co-evaluation              AM-PAC PT "6 Clicks" Mobility   Outcome Measure  Help needed turning from your back to your side while in a flat bed without using bedrails?: None Help needed moving from lying on your back to sitting on the side of a flat bed without using bedrails?: None Help needed moving to and from a bed to a chair (including a wheelchair)?: None Help needed standing up from a chair using your arms (e.g., wheelchair or bedside chair)?: A Little Help needed to walk in hospital room?: A Little Help needed climbing 3-5 steps with a railing? : A Little 6 Click Score: 21    End of Session Equipment Utilized During  Treatment: Gait belt Activity Tolerance: Patient tolerated treatment well Patient left: in chair;with call bell/phone within reach;with chair alarm set;with family/visitor present Nurse Communication: Mobility status PT Visit Diagnosis: Muscle weakness (generalized) (M62.81);Unsteadiness on feet (R26.81)     Time: 0932-3557 PT Time Calculation (min) (ACUTE ONLY): 24 min  Charges:  $Gait Training: 8-22 mins $Therapeutic Activity: 8-22 mins                     Felecia Shelling  PTA Acute  Rehabilitation Services Pager      [phone] Office      [phone]

## 2020-12-01 NOTE — Progress Notes (Signed)
Patient refused  2200 Scheduled lovenox. Pt educated on the benefits. Patient wants to speak to doctor and sister in law before receiving medication. Will make day shift nurse aware.

## 2020-12-01 NOTE — Progress Notes (Signed)
Pharmacy: IV to PO  Discussed w/ Dr> Jerolyn Center  IV Unasyn > Augmentin 875 mg po bid IV PPI> protonix 40 po qday   Herby Abraham, Pharm.D 12/01/2020 12:16 PM

## 2020-12-01 NOTE — Progress Notes (Signed)
PROGRESS NOTE    Jay Bailey  [id number] DOB: [birth year] DOA: 11/27/2020 PCP: Harlan Stains, MD   Brief Narrative: Jay Roys Weineris a 79 y.o.malewith Past medical history ofHTN, HLD, cerebral palsy, depression, type II DM, melanoma SP nasal surgery, prostate cancer SP prostatectomy with recently increased PSA SP radiation. Patient presents with complaints of nausea and diarrhea. Found to have a partial small bowel obstruction. Currently plan is conservative measures.  Assessment & Plan:   Active Problems:   Partial small bowel obstruction (HCC)   Intestinal occlusion (HCC)   Nausea vomiting and diarrhea    #1 partial small bowel obstruction/terminal ileitis/diarrhea CT evidence of partial SBO as well as terminal ileitis.  NG tube taken out 11/30/2020.  Patient tolerating full liquid diet.  Surgery advance diet to soft diet today.  We will see how he tolerates.Continue as needed Zofran continue Unasyn for ileitis.  C. difficile negative.  #2 history of essential hypertension on as needed Lopressor restart home meds once he is able to take p.o.  He was on Norvasc losartan and HCTZ at home.  #3 history of uncontrolled type 2 diabetes with hyperlipidemia was on Lipitor at home with Metformin.  Restart when able to take p.o. 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  Not on any meds at home.  #6 PVCs on telemetry due to hypokalemia and hypomagnesemia replete and recheck.  K is 3.1 repleted.  Recheck in a.m.  #7 goals of care patient is DNR on admission with history of cerebral palsy.  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  Nutrition Problem: Inadequate oral intake Etiology: altered GI function   Signs/Symptoms: NPO status  Interventions: Refer to  RD note for recommendations  Estimated body mass index is 29.35 kg/m as calculated from the following:   Height as of this encounter: 5\' 7"  (1.702 m).   Weight as of this encounter: 85 kg.  DVT prophylaxis:  Code Status: DNR Family Communication: None at bedside Disposition Plan:  Status is: Inpatient   Dispo: The patient is from: Home              Anticipated d/c is to: Home              Anticipated d/c date is:1 day              Patient currently is not medically stable to d/c.   Consultants: None  Procedures: NG tube Antimicrobials: Unasyn  Subjective:  Doing better on fld Objective: Vitals:   11/30/20 1259 11/30/20 1617 11/30/20 2055 12/01/20 0433  BP:  (!) 160/73 (!) 170/79 (!) 150/71  Pulse: 61  (!) 58 (!) 54  Resp: 20  18 18   Temp: (!) 97.4 F (36.3 C)  97.9 F (36.6 C) 98.1 F (36.7 C)  TempSrc: Oral   Oral  SpO2: 97%  97% 94%  Weight:    85 kg  Height:        Intake/Output Summary (Last 24 hours) at 12/01/2020 1137 Last data filed at 12/01/2020 0959 Gross per 24 hour  Intake 3857.68 ml  Output 425 ml  Net 3432.68 ml   Filed Weights   11/27/20 1254 11/29/20 0610 12/01/20 0433  Weight: 85.3 kg 83.6 kg 85 kg    Examination:  General  exam: Appears mild distress due to NG tube Respiratory system: Clear to auscultation. Respiratory effort normal. Cardiovascular system: S1 & S2 heard, RRR. No JVD, murmurs, rubs, gallops or clicks. No pedal edema. Gastrointestinal system: Abdomen is distended, soft and mild tender. No organomegaly or masses felt. Normal bowel sounds heard. Central nervous system: Alert and oriented. No focal neurological deficits. Extremities: No edema Skin: No rashes, lesions or ulcers Psychiatry: Judgement and insight appear normal. Mood & affect appropriate.     Data Reviewed: I have personally reviewed following labs and imaging studies  CBC: Recent Labs  Lab 11/27/20 1343 11/28/20 1111 11/29/20 0856 11/30/20 0507  12/01/20 0523  WBC 3.1* 4.4 6.9 5.6 6.0  NEUTROABS 2.4  --  4.8 3.7  --   HGB 15.4 13.9 14.1 11.8* 11.3*  HCT 44.9 41.0 41.6 34.6* 33.6*  MCV 95.3 95.1 97.0 96.6 96.6  PLT 160 152 160 156 0000000*   Basic Metabolic Panel: Recent Labs  Lab 11/27/20 1343 11/28/20 0315 11/29/20 0856 11/30/20 0507 12/01/20 0523  NA 136 134* 139 140 135  K 3.4* 4.4 3.5 3.1* 3.5  CL 100 102 103 106 103  CO2 23 21* 25 26 25   GLUCOSE 183* 163* 123* 103* 128*  BUN 20 21 24* 19 9  CREATININE 1.10 1.10 1.04 0.92 0.70  CALCIUM 9.1 7.9* 8.4* 8.0* 7.9*  MG  --  1.2* 2.1 2.5* 1.8   GFR: Estimated Creatinine Clearance: 80.6 mL/min (by C-G formula based on SCr of 0.7 mg/dL). Liver Function Tests: Recent Labs  Lab 11/27/20 1343 11/28/20 0315 11/29/20 0856 11/30/20 0507  AST 18 19 22 20   ALT 37 29 34 30  ALKPHOS 67 46 52 47  BILITOT 1.0 0.9 0.7 1.0  PROT 7.6 6.0* 5.9* 5.2*  ALBUMIN 4.1 3.2* 3.1* 2.8*   Recent Labs  Lab 11/27/20 1343  LIPASE 18   No results for input(s): AMMONIA in the last 168 hours. Coagulation Profile: No results for input(s): INR, PROTIME in the last 168 hours. Cardiac Enzymes: No results for input(s): CKTOTAL, CKMB, CKMBINDEX, TROPONINI in the last 168 hours. BNP (last 3 results) No results for input(s): PROBNP in the last 8760 hours. HbA1C: No results for input(s): HGBA1C in the last 72 hours. CBG: Recent Labs  Lab 11/30/20 1616 11/30/20 2055 11/30/20 2344 12/01/20 0429 12/01/20 0740  GLUCAP 114* 116* 141* 121* 127*   Lipid Profile: No results for input(s): CHOL, HDL, LDLCALC, TRIG, CHOLHDL, LDLDIRECT in the last 72 hours. Thyroid Function Tests: No results for input(s): TSH, T4TOTAL, FREET4, T3FREE, THYROIDAB in the last 72 hours. Anemia Panel: No results for input(s): VITAMINB12, FOLATE, FERRITIN, TIBC, IRON, RETICCTPCT in the last 72 hours. Sepsis Labs: Recent Labs  Lab 11/27/20 1540 11/27/20 1934 11/30/20 0507  LATICACIDVEN 1.2 1.6 0.7    Recent  Results (from the past 240 hour(s))  Resp Panel by RT-PCR (Flu A&B, Covid) Nasopharyngeal Swab     Status: None   Collection Time: 11/27/20  1:43 PM   Specimen: Nasopharyngeal Swab; Nasopharyngeal(NP) swabs in vial transport medium  Result Value Ref Range Status   SARS Coronavirus 2 by RT PCR NEGATIVE NEGATIVE Final    Comment: (NOTE) SARS-CoV-2 target nucleic acids are NOT DETECTED.  The SARS-CoV-2 RNA is generally detectable in upper respiratory specimens during the acute phase of infection. The lowest concentration of SARS-CoV-2 viral copies this assay can detect is 138 copies/mL. A negative result does not preclude SARS-Cov-2 infection and should not be used as the sole  basis for treatment or other patient management decisions. A negative result may occur with  improper specimen collection/handling, submission of specimen other than nasopharyngeal swab, presence of viral mutation(s) within the areas targeted by this assay, and inadequate number of viral copies(<138 copies/mL). A negative result must be combined with clinical observations, patient history, and epidemiological information. The expected result is Negative.  Fact Sheet for Patients:  EntrepreneurPulse.com.au  Fact Sheet for Healthcare Providers:  IncredibleEmployment.be  This test is no t yet approved or cleared by the [place] FDA and  has been authorized for detection and/or diagnosis of SARS-CoV-2 by FDA under an Emergency Use Authorization (EUA). This EUA will remain  in effect (meaning this test can be used) for the duration of the COVID-19 declaration under Section 564(b)(1) of the Act, 21 U.S.C.section 360bbb-3(b)(1), unless the authorization is terminated  or revoked sooner.       Influenza A by PCR NEGATIVE NEGATIVE Final   Influenza B by PCR NEGATIVE NEGATIVE Final    Comment: (NOTE) The Xpert Xpress SARS-CoV-2/FLU/RSV plus assay is intended as an aid in the  diagnosis of influenza from Nasopharyngeal swab specimens and should not be used as a sole basis for treatment. Nasal washings and aspirates are unacceptable for Xpert Xpress SARS-CoV-2/FLU/RSV testing.  Fact Sheet for Patients: EntrepreneurPulse.com.au  Fact Sheet for Healthcare Providers: IncredibleEmployment.be  This test is not yet approved or cleared by the [place] FDA and has been authorized for detection and/or diagnosis of SARS-CoV-2 by FDA under an Emergency Use Authorization (EUA). This EUA will remain in effect (meaning this test can be used) for the duration of the COVID-19 declaration under Section 564(b)(1) of the Act, 21 U.S.C. section 360bbb-3(b)(1), unless the authorization is terminated or revoked.  Performed at [facility], [place] [address], [place], [place] [address]   Blood culture (routine x 2)     Status: None (Preliminary result)   Collection Time: 11/27/20  7:33 PM   Specimen: BLOOD  Result Value Ref Range Status   Specimen Description   Final    BLOOD SITE NOT SPECIFIED Performed at [facility] [address], [place], [place] [address]    Special Requests   Final    BOTTLES DRAWN AEROBIC AND ANAEROBIC Blood Culture adequate volume Performed at [facility] [address] [place], [place] [address]    Culture   Final    NO GROWTH 3 DAYS Performed at [facility], [place] [address], [place], [place] [address]    Report Status PENDING  Incomplete  Gastrointestinal Panel by PCR , Stool     Status: None   Collection Time: 11/27/20  7:40 PM   Specimen: Stool  Result Value Ref Range Status   Campylobacter species NOT DETECTED NOT DETECTED Final   Plesimonas shigelloides NOT DETECTED NOT DETECTED Final   Salmonella species NOT DETECTED NOT DETECTED Final   Yersinia enterocolitica NOT DETECTED NOT DETECTED Final   Vibrio species NOT DETECTED NOT DETECTED Final    Vibrio cholerae NOT DETECTED NOT DETECTED Final   Enteroaggregative E coli (EAEC) NOT DETECTED NOT DETECTED Final   Enteropathogenic E coli (EPEC) NOT DETECTED NOT DETECTED Final   Enterotoxigenic E coli (ETEC) NOT DETECTED NOT DETECTED Final   Shiga like toxin producing E coli (STEC) NOT DETECTED NOT DETECTED Final   Shigella/Enteroinvasive E coli (EIEC) NOT DETECTED NOT DETECTED Final   Cryptosporidium NOT DETECTED NOT DETECTED Final   Cyclospora cayetanensis NOT DETECTED  NOT DETECTED Final   Entamoeba histolytica NOT DETECTED NOT DETECTED Final   Giardia lamblia NOT DETECTED NOT DETECTED Final   Adenovirus F40/41 NOT DETECTED NOT DETECTED Final   Astrovirus NOT DETECTED NOT DETECTED Final   Norovirus GI/GII NOT DETECTED NOT DETECTED Final   Rotavirus A NOT DETECTED NOT DETECTED Final   Sapovirus (I, II, IV, and V) NOT DETECTED NOT DETECTED Final    Comment: Performed at [facility], [address] [place], [place] [address]  C Difficile Quick Screen w PCR reflex     Status: None   Collection Time: 11/27/20  7:40 PM   Specimen: Stool  Result Value Ref Range Status   C Diff antigen NEGATIVE NEGATIVE Final   C Diff toxin NEGATIVE NEGATIVE Final   C Diff interpretation No C. difficile detected.  Final    Comment: Performed at [facility], [address], [place], [place] [address]  Culture, blood (Routine X 2) w Reflex to ID Panel     Status: None (Preliminary result)   Collection Time: 11/30/20  5:07 AM   Specimen: BLOOD RIGHT HAND  Result Value Ref Range Status   Specimen Description   Final    BLOOD RIGHT HAND Performed at [facility], [address] [place], [place] [address]    Special Requests   Final    BOTTLES DRAWN AEROBIC ONLY Blood Culture adequate volume Performed at [facility], [address] [place], [place] [address]    Culture   Final    NO GROWTH < 24 HOURS Performed at [facility], [address], [place], [place] [address]    Report Status PENDING  Incomplete         Radiology Studies: No results found.      Scheduled Meds: . enoxaparin (LOVENOX) injection  40 mg Subcutaneous Q24H  . insulin aspart  0-9 Units Subcutaneous Q4H  . pantoprazole (PROTONIX) IV  40 mg Intravenous Q24H   Continuous Infusions: . ampicillin-sulbactam (UNASYN) IV 3 g (12/01/20 0932)  . lactated ringers 75 mL/hr at 11/30/20 2239  . methocarbamol (ROBAXIN) IV       LOS: 4 days      Alwyn Ren, MD 12/01/2020, 11:37 AM

## 2020-12-01 NOTE — TOC Transition Note (Signed)
Transition of Care [facility]) - CM/SW Discharge Note   Patient Details  Name: Jay Bailey MRN: [id number] Date of Birth: 28-Apr-1941  [facility] CM/SW Contact:  Lanier Clam, RN Phone Number: 12/01/2020, 1:18 PM   Clinical Narrative:  Spoke to sister Shelly-has no preference for HHC or dme company-agree to [facility] for HHPT;[employer] for dme-will deliver rw to rm prior d/c. Family will transport home on own. No further CM needs.      Final next level of care: Home w Home Health Services Barriers to Discharge: No Barriers Identified   Patient Goals and CMS Choice Patient states their goals for this hospitalization and ongoing recovery are:: go home CMS Medicare.gov Compare Post Acute Care list provided to:: Patient Represenative (must comment) (sister Shelly [phone]) Choice offered to / list presented to : Sibling  Discharge Placement                       Discharge Plan and Services   Discharge Planning Services: CM Consult            DME Arranged: Dan Humphreys rolling DME Agency:  [facility]) Date DME Agency Contacted: 12/01/20 Time DME Agency Contacted: [phone] Representative spoke with at DME Agency: Vaughan Basta HH Arranged: PT HH Agency: [facility]) Date HH Agency Contacted: 12/01/20 Time HH Agency Contacted: [phone] Representative spoke with at [facility] Agency: Tim  Social Determinants of Health (SDOH) Interventions     Readmission Risk Interventions No flowsheet data found.

## 2020-12-02 DIAGNOSIS — R112 Nausea with vomiting, unspecified: Secondary | ICD-10-CM | POA: Diagnosis not present

## 2020-12-02 DIAGNOSIS — K567 Ileus, unspecified: Secondary | ICD-10-CM | POA: Diagnosis not present

## 2020-12-02 DIAGNOSIS — R197 Diarrhea, unspecified: Secondary | ICD-10-CM | POA: Diagnosis not present

## 2020-12-02 DIAGNOSIS — K56609 Unspecified intestinal obstruction, unspecified as to partial versus complete obstruction: Secondary | ICD-10-CM | POA: Diagnosis not present

## 2020-12-02 LAB — BASIC METABOLIC PANEL
Anion gap: 8 (ref 5–15)
BUN: 10 mg/dL (ref 8–23)
CO2: 27 mmol/L (ref 22–32)
Calcium: 8.2 mg/dL — ABNORMAL LOW (ref 8.9–10.3)
Chloride: 100 mmol/L (ref 98–111)
Creatinine, Ser: 0.83 mg/dL (ref 0.61–1.24)
GFR, Estimated: >60 mL/min (ref >60)
Glucose, Bld: 130 mg/dL — ABNORMAL HIGH (ref 70–99)
Potassium: 3.8 mmol/L (ref 3.5–5.1)
Sodium: 135 mmol/L (ref 135–145)

## 2020-12-02 LAB — GLUCOSE, CAPILLARY
Glucose-Capillary: 134 mg/dL — ABNORMAL HIGH (ref 70–99)
Glucose-Capillary: 126 mg/dL — ABNORMAL HIGH (ref 70–99)
Glucose-Capillary: 140 mg/dL — ABNORMAL HIGH (ref 70–99)

## 2020-12-02 LAB — MAGNESIUM: Magnesium: 1.9 mg/dL (ref 1.7–2.4)

## 2020-12-02 MED ORDER — PANTOPRAZOLE SODIUM 40 MG PO TBEC: 40.0000 mg | DELAYED_RELEASE_TABLET | Freq: Every day | ORAL | 1 refills | Status: DC

## 2020-12-02 MED ORDER — AMOXICILLIN-POT CLAVULANATE 875-125 MG PO TABS: 1.0000 | ORAL_TABLET | Freq: Two times a day (BID) | ORAL | 0 refills | Status: DC

## 2020-12-02 MED ORDER — ONDANSETRON HCL 4 MG PO TABS: 4.0000 mg | ORAL_TABLET | Freq: Four times a day (QID) | ORAL | 0 refills | Status: DC | PRN

## 2020-12-02 NOTE — Progress Notes (Signed)
Went over discharge with sister/caregiver.

## 2020-12-02 NOTE — Plan of Care (Signed)
  Problem: Health Behavior/Discharge Planning: Goal: Ability to manage health-related needs will improve Outcome: Progressing   Problem: Clinical Measurements: Goal: Ability to maintain clinical measurements within normal limits will improve Outcome: Progressing   Problem: Clinical Measurements: Goal: Will remain free from infection Outcome: Progressing   Problem: Activity: Goal: Risk for activity intolerance will decrease Outcome: Progressing   Problem: Coping: Goal: Level of anxiety will decrease Outcome: Progressing   Problem: Elimination: Goal: Will not experience complications related to urinary retention Outcome: Progressing   Problem: Safety: Goal: Ability to remain free from injury will improve Outcome: Progressing   Problem: Skin Integrity: Goal: Risk for impaired skin integrity will decrease Outcome: Progressing

## 2020-12-02 NOTE — TOC Transition Note (Signed)
Transition of Care [facility]) - CM/SW Discharge Note   Patient Details  Name: Jay Bailey MRN: [id number] Date of Birth: [birth year]  [facility] of Care [facility]) CM/SW Contact:  Darleene Cleaver, LCSW Phone Number: 12/02/2020, 11:37 AM   Clinical Narrative:     Patient will be going home with home health through [facility].  CSW signing off please reconsult with any other social work needs, home health agency has been notified of planned discharge.   Final next level of care: Home w Home Health Services Barriers to Discharge: Barriers Resolved   Patient Goals and CMS Choice Patient states their goals for this hospitalization and ongoing recovery are:: To return back home with home health. CMS Medicare.gov Compare Post Acute Care list provided to:: Patient Choice offered to / list presented to : Patient  Discharge Placement  Patient discharging back home.                     Discharge Plan and Services   Discharge Planning Services: CM Consult Post Acute Care Choice: Durable Medical Equipment          DME Arranged: Walker rolling DME Agency: Other - Comment [facility]) Date DME Agency Contacted: 12/01/20 Time DME Agency Contacted: [phone] Representative spoke with at DME Agency: Vaughan Basta HH Arranged: PT HH Agency: [facility] Date HH Agency Contacted: 12/01/20 Time HH Agency Contacted: [phone] Representative spoke with at [facility] Agency: Timi  Social Determinants of Health (SDOH) Interventions     Readmission Risk Interventions No flowsheet data found.

## 2020-12-03 LAB — CULTURE, BLOOD (ROUTINE X 2)
Culture: NO GROWTH
Special Requests: ADEQUATE

## 2020-12-03 NOTE — Discharge Summary (Signed)
Physician Discharge Summary  Jay Bailey [id number] DOB: 1940/12/04 DOA: 11/27/2020  PCP: Harlan Stains, MD  Admit date: 11/27/2020 Discharge date: 12/03/2020  Admitted From: Home Disposition: Home Recommendations for Outpatient Follow-up:  1. Follow up with PCP in 1-2 weeks 2. Please obtain BMP/CBC in one week :Home Health none Equipment/Devices: None  Discharge Condition: Stable CODE STATUS full code  diet recommendation: Cardiac   brief/Interim Summary:Jay M Weineris a 80 y.o.malewith Past medical history ofHTN, HLD, cerebral palsy, depression, type II DM, melanoma SP nasal surgery, prostate cancer SP prostatectomy with recently increased PSA SP radiation. Patient presents with complaints of nausea and diarrhea. Found to have a partial small bowel obstruction.  Discharge Diagnoses:  Active Problems:   Partial small bowel obstruction (HCC)   Intestinal occlusion (HCC)   Nausea vomiting and diarrhea     #1 partial small bowel obstruction/terminal ileitis/diarrhea CT evidence of partial SBO as well as terminal ileitis.  NG tube taken out 11/30/2020.  Patient tolerated his diet advancement.  He was discharged on Augmentin.   #2 history of essential hypertension continue home meds  #3 history of uncontrolled type 2 diabetes continue home meds  #4 history of prostate cancer/urinary incontinence on Ditropan  #5 cerebral palsy/anxiety/depression as needed Ativan ordered.  Not on any meds at home.  #6 PVCs on telemetry due to hypokalemia and hypomagnesemia.  These were repleted potassium was 3.8 and magnesium was 1.9 on discharge   Nutrition Problem: Inadequate oral intake Etiology: altered GI function    Signs/Symptoms: NPO status     Interventions: Refer to RD note for recommendations  Estimated body mass index is 30.45 kg/m as calculated from the following:   Height as of this encounter: 5\' 7"  (1.702 m).   Weight as of this encounter: 88.2  kg.  Discharge Instructions  Discharge Instructions    Call MD for:  difficulty breathing, headache or visual disturbances   Complete by: As directed    Call MD for:  persistant nausea and vomiting   Complete by: As directed    Diet - low sodium heart healthy   Complete by: As directed    Increase activity slowly   Complete by: As directed      Allergies as of 12/02/2020      Reactions   Lisinopril    Other reaction(s): cough      Medication List    TAKE these medications   Accu-Chek Guide test strip Generic drug: glucose blood   amLODipine 2.5 MG tablet Commonly known as: NORVASC Take 2.5 mg by mouth daily.   amoxicillin-clavulanate 875-125 MG tablet Commonly known as: AUGMENTIN Take 1 tablet by mouth every 12 (twelve) hours.   ascorbic acid 1000 MG tablet Commonly known as: VITAMIN C Take 1,000 mg by mouth daily.   atorvastatin 80 MG tablet Commonly known as: LIPITOR Take 80 mg by mouth daily.   folic acid A999333 MCG tablet Commonly known as: FOLVITE Take 400 mcg by mouth daily.   irbesartan-hydrochlorothiazide 150-12.5 MG tablet Commonly known as: AVALIDE Take 1 tablet by mouth daily.   loperamide 2 MG capsule Commonly known as: IMODIUM Take 2 mg by mouth daily as needed for diarrhea or loose stools.   metFORMIN 500 MG 24 hr tablet Commonly known as: GLUCOPHAGE-XR Take 500 mg by mouth daily with breakfast.   multivitamin tablet Take 1 tablet by mouth daily.   ondansetron 4 MG tablet Commonly known as: ZOFRAN Take 1 tablet (4 mg total) by mouth every 6 (  six) hours as needed for nausea.   oxybutynin 5 MG 24 hr tablet Commonly known as: DITROPAN-XL Take 1 tablet by mouth daily.   pantoprazole 40 MG tablet Commonly known as: PROTONIX Take 1 tablet (40 mg total) by mouth daily.   prochlorperazine 10 MG tablet Commonly known as: COMPAZINE Take 1 tablet (10 mg total) by mouth every 6 (six) hours as needed for nausea or vomiting.   vitamin E 180  MG (400 UNITS) capsule Take 400 Units by mouth daily.       Follow-up Information    [facility]   Specialty: [place] Why: [facility] physical therapy Contact information: [place] [address] [phone]        [facility] Follow up.   Why: rolling walker Contact information: [address] [place] [address] [phone]             Allergies  Allergen Reactions  . Lisinopril     Other reaction(s): cough    Consultations: General surgery  Procedures/Studies: DG Abd 1 View  Result Date: 11/29/2020 CLINICAL DATA:  Nasogastric tube placement EXAM: ABDOMEN - 1 VIEW COMPARISON:  11/28/2020 FINDINGS: Nasogastric tube is seen extending into the expected mid body of the stomach. Multiple gas-filled loops of large and small bowel are seen throughout the abdomen suggestive of a diffuse ileus. No gross free intraperitoneal gas. Bilateral total hip arthroplasty has been performed. Bilateral inguinal hernia repair with mesh has been performed. IMPRESSION: Nasogastric tube within the expected mid body of the stomach. Electronically Signed   By: Jay Salisbury MD   On: 11/29/2020 04:55   CT ABDOMEN PELVIS W CONTRAST  Result Date: 11/27/2020 CLINICAL DATA:  Diarrhea.  Fatigue.  History of prostate cancer. EXAM: CT ABDOMEN AND PELVIS WITH CONTRAST TECHNIQUE: Multidetector CT imaging of the abdomen and pelvis was performed using the standard protocol following bolus administration of intravenous contrast. CONTRAST:  147mL OMNIPAQUE IOHEXOL 300 MG/ML  SOLN COMPARISON:  03/02/2020 FINDINGS: Lower chest: Lung bases are clear. Hepatobiliary: Liver, gallbladder and biliary tree are normal. Pancreas: Mild fatty atrophy of the pancreas. Spleen: Normal. Adrenals/Urinary Tract: Adrenal glands are normal. Kidneys are normal in size without hydronephrosis or nephrolithiasis. Several bilateral renal cysts present with the largest measuring 5.1 cm over the  lower pole right kidney. Visualized ureters are normal as the distal ureters are obscured due to streak artifact from bilateral hip prostheses. Bladder is mostly obscured by the streak artifact from the hip prostheses. Stomach/Bowel: Stomach is normal. There multiple air and fluid-filled dilated small bowel loops measuring up to 4.6 cm in diameter. There appears to be gradual transition to normal caliber ileum as the ileum has intermittent areas of mild wall thickening to the level of the ileocecal valve. No free peritoneal air or pneumatosis. Appendix is normal. Air and stool present throughout the colon. There is mild colonic diverticulosis. Vascular/Lymphatic: Minimal calcified plaque over the abdominal aorta which is normal in caliber. No adenopathy. Reproductive: Region of the prostate obscured by streak artifact from hip prostheses. Other: No significant free fluid. Multiple surgical clips over the anterior lower abdomen/pelvic wall likely due to previous hernia repair. Residual left inguinal hernia unchanged containing small amount of fluid and peritoneal fat. Surgical clips over the left inguinal region. Musculoskeletal: Degenerative changes of the spine. Bilateral hip prostheses intact. IMPRESSION: 1. Multiple air and fluid-filled dilated small bowel loops measuring up to 4.6 cm in diameter with gradual transition to  normal caliber ileum as the ileum has intermittent areas of mild wall thickening to the level of the ileocecal valve. Findings are likely due to ileus possibly secondary regional enteritis of infectious or inflammatory nature. Distal partial small bowel obstruction is possible. No free peritoneal air. 2. Bilateral renal cysts with the largest measuring 5.1 cm over the lower pole right kidney. 3. Mild colonic diverticulosis. 4. Aortic atherosclerosis. 5. Stable residual left inguinal hernia containing small amount of fluid and peritoneal fat. Aortic Atherosclerosis (ICD10-I70.0). Electronically  Signed   By: Marin Olp M.D.   On: 11/27/2020 16:38   DG Abd Portable 1V  Result Date: 11/28/2020 CLINICAL DATA:  80 year old male NG tube placement. Small bowel obstruction versus ileus. EXAM: PORTABLE ABDOMEN - 1 VIEW COMPARISON:  CT Abdomen and Pelvis 11/27/2020. FINDINGS: Portable AP semi upright view at 0856 hours. Enteric tube placed into the stomach with side hole at the level of the gastric body. Continued left upper quadrant dilated small bowel loops roughly 45-50 mm. Bowel gas remains in the splenic flexure. Stable lung bases. No acute osseous abnormality identified. IMPRESSION: 1. Enteric tube placed into the stomach, side hole the level of the gastric body. 2. Continued proximal small bowel dilatation. Electronically Signed   By: Genevie Ann M.D.   On: 11/28/2020 09:05    (Echo, Carotid, EGD, Colonoscopy, ERCP)    Subjective:  Patient resting in bed anxious to go home tolerating diet had bowel movements passing gas Discharge Exam: Vitals:   12/02/20 0410 12/02/20 0412  BP: (!) 179/74 (!) 164/73  Pulse: (!) 51 (!) 50  Resp: 18   Temp: 97.8 F (36.6 C)   SpO2: 99% 99%   Vitals:   12/01/20 1958 12/02/20 0410 12/02/20 0411 12/02/20 0412  BP: (!) 169/75 (!) 179/74  (!) 164/73  Pulse: (!) 55 (!) 51  (!) 50  Resp: 16 18    Temp: (!) 97.5 F (36.4 C) 97.8 F (36.6 C)    TempSrc:  Oral    SpO2: 94% 99%  99%  Weight:   88.2 kg   Height:   5\' 7"  (1.702 m)     General: Pt is alert, awake, not in acute distress Cardiovascular: RRR, S1/S2 +, no rubs, no gallops Respiratory: CTA bilaterally, no wheezing, no rhonchi Abdominal: Soft, NT, distended bowel sounds + Extremities: no edema, no cyanosis    The results of significant diagnostics from this hospitalization (including imaging, microbiology, ancillary and laboratory) are listed below for reference.     Microbiology: Recent Results (from the past 240 hour(s))  Resp Panel by RT-PCR (Flu A&B, Covid) Nasopharyngeal Swab      Status: None   Collection Time: 11/27/20  1:43 PM   Specimen: Nasopharyngeal Swab; Nasopharyngeal(NP) swabs in vial transport medium  Result Value Ref Range Status   SARS Coronavirus 2 by RT PCR NEGATIVE NEGATIVE Final    Comment: (NOTE) SARS-CoV-2 target nucleic acids are NOT DETECTED.  The SARS-CoV-2 RNA is generally detectable in upper respiratory specimens during the acute phase of infection. The lowest concentration of SARS-CoV-2 viral copies this assay can detect is 138 copies/mL. A negative result does not preclude SARS-Cov-2 infection and should not be used as the sole basis for treatment or other patient management decisions. A negative result may occur with  improper specimen collection/handling, submission of specimen other than nasopharyngeal swab, presence of viral mutation(s) within the areas targeted by this assay, and inadequate number of viral copies(<138 copies/mL). A negative result must be combined  with clinical observations, patient history, and epidemiological information. The expected result is Negative.  Fact Sheet for Patients:  BloggerCourse.com  Fact Sheet for Healthcare Providers:  SeriousBroker.it  This test is no t yet approved or cleared by the [place] FDA and  has been authorized for detection and/or diagnosis of SARS-CoV-2 by FDA under an Emergency Use Authorization (EUA). This EUA will remain  in effect (meaning this test can be used) for the duration of the COVID-19 declaration under Section 564(b)(1) of the Act, 21 U.S.C.section 360bbb-3(b)(1), unless the authorization is terminated  or revoked sooner.       Influenza A by PCR NEGATIVE NEGATIVE Final   Influenza B by PCR NEGATIVE NEGATIVE Final    Comment: (NOTE) The Xpert Xpress SARS-CoV-2/FLU/RSV plus assay is intended as an aid in the diagnosis of influenza from Nasopharyngeal swab specimens and should not be used as a sole basis  for treatment. Nasal washings and aspirates are unacceptable for Xpert Xpress SARS-CoV-2/FLU/RSV testing.  Fact Sheet for Patients: BloggerCourse.com  Fact Sheet for Healthcare Providers: SeriousBroker.it  This test is not yet approved or cleared by the [place] FDA and has been authorized for detection and/or diagnosis of SARS-CoV-2 by FDA under an Emergency Use Authorization (EUA). This EUA will remain in effect (meaning this test can be used) for the duration of the COVID-19 declaration under Section 564(b)(1) of the Act, 21 U.S.C. section 360bbb-3(b)(1), unless the authorization is terminated or revoked.  Performed at [facility], [place], [address] [place], [place] [address]   Blood culture (routine x 2)     Status: None   Collection Time: 11/27/20  7:33 PM   Specimen: BLOOD  Result Value Ref Range Status   Specimen Description   Final    BLOOD SITE NOT SPECIFIED Performed at [facility], [address], [place], [place] [address]    Special Requests   Final    BOTTLES DRAWN AEROBIC AND ANAEROBIC Blood Culture adequate volume Performed at [facility], [address], [place], [place] [address]    Culture   Final    NO GROWTH 5 DAYS Performed at [facility], [address], [place], [place] [address]    Report Status 12/03/2020 FINAL  Final  Gastrointestinal Panel by PCR , Stool     Status: None   Collection Time: 11/27/20  7:40 PM   Specimen: Stool  Result Value Ref Range Status   Campylobacter species NOT DETECTED NOT DETECTED Final   Plesimonas shigelloides NOT DETECTED NOT DETECTED Final   Salmonella species NOT DETECTED NOT DETECTED Final   Yersinia enterocolitica NOT DETECTED NOT DETECTED Final   Vibrio species NOT DETECTED NOT DETECTED Final   Vibrio cholerae NOT DETECTED NOT DETECTED Final   Enteroaggregative E coli (EAEC) NOT DETECTED NOT DETECTED Final    Enteropathogenic E coli (EPEC) NOT DETECTED NOT DETECTED Final   Enterotoxigenic E coli (ETEC) NOT DETECTED NOT DETECTED Final   Shiga like toxin producing E coli (STEC) NOT DETECTED NOT DETECTED Final   Shigella/Enteroinvasive E coli (EIEC) NOT DETECTED NOT DETECTED Final   Cryptosporidium NOT DETECTED NOT DETECTED Final   Cyclospora cayetanensis NOT DETECTED NOT DETECTED Final   Entamoeba histolytica NOT DETECTED NOT DETECTED Final   Giardia lamblia NOT DETECTED NOT DETECTED Final   Adenovirus F40/41 NOT DETECTED NOT DETECTED Final   Astrovirus NOT DETECTED NOT DETECTED Final   Norovirus GI/GII NOT DETECTED NOT DETECTED Final   Rotavirus  A NOT DETECTED NOT DETECTED Final   Sapovirus (I, II, IV, and V) NOT DETECTED NOT DETECTED Final    Comment: Performed at [facility], [address] [place], [place] [address]  C Difficile Quick Screen w PCR reflex     Status: None   Collection Time: 11/27/20  7:40 PM   Specimen: Stool  Result Value Ref Range Status   C Diff antigen NEGATIVE NEGATIVE Final   C Diff toxin NEGATIVE NEGATIVE Final   C Diff interpretation No C. difficile detected.  Final    Comment: Performed at [facility], [place] [address], [place], [place] [address]  Culture, blood (Routine X 2) w Reflex to ID Panel     Status: None (Preliminary result)   Collection Time: 11/30/20  5:07 AM   Specimen: BLOOD RIGHT HAND  Result Value Ref Range Status   Specimen Description   Final    BLOOD RIGHT HAND Performed at [facility] [address] [place], [place] [address]    Special Requests   Final    BOTTLES DRAWN AEROBIC ONLY Blood Culture adequate volume Performed at [facility] [address], [place], [place] [address]    Culture   Final    NO GROWTH 3 DAYS Performed at [facility], [place] [address] [place], [place] [address]    Report Status PENDING  Incomplete     Labs: BNP (last 3  results) No results for input(s): BNP in the last 8760 hours. Basic Metabolic Panel: Recent Labs  Lab 11/28/20 0315 11/29/20 0856 11/30/20 0507 12/01/20 0523 12/02/20 0523  NA 134* 139 140 135 135  K 4.4 3.5 3.1* 3.5 3.8  CL 102 103 106 103 100  CO2 21* 25 26 25 27   GLUCOSE 163* 123* 103* 128* 130*  BUN 21 24* 19 9 10   CREATININE 1.10 1.04 0.92 0.70 0.83  CALCIUM 7.9* 8.4* 8.0* 7.9* 8.2*  MG 1.2* 2.1 2.5* 1.8 1.9   Liver Function Tests: Recent Labs  Lab 11/27/20 1343 11/28/20 0315 11/29/20 0856 11/30/20 0507  AST 18 19 22 20   ALT 37 29 34 30  ALKPHOS 67 46 52 47  BILITOT 1.0 0.9 0.7 1.0  PROT 7.6 6.0* 5.9* 5.2*  ALBUMIN 4.1 3.2* 3.1* 2.8*   Recent Labs  Lab 11/27/20 1343  LIPASE 18   No results for input(s): AMMONIA in the last 168 hours. CBC: Recent Labs  Lab 11/27/20 1343 11/28/20 1111 11/29/20 0856 11/30/20 0507 12/01/20 0523  WBC 3.1* 4.4 6.9 5.6 6.0  NEUTROABS 2.4  --  4.8 3.7  --   HGB 15.4 13.9 14.1 11.8* 11.3*  HCT 44.9 41.0 41.6 34.6* 33.6*  MCV 95.3 95.1 97.0 96.6 96.6  PLT 160 152 160 156 138*   Cardiac Enzymes: No results for input(s): CKTOTAL, CKMB, CKMBINDEX, TROPONINI in the last 168 hours. BNP: Invalid input(s): POCBNP CBG: Recent Labs  Lab 12/01/20 1955 12/01/20 2337 12/02/20 0357 12/02/20 0736 12/02/20 1153  GLUCAP 123* 163* 134* 126* 140*   D-Dimer No results for input(s): DDIMER in the last 72 hours. Hgb A1c No results for input(s): HGBA1C in the last 72 hours. Lipid Profile No results for input(s): CHOL, HDL, LDLCALC, TRIG, CHOLHDL, LDLDIRECT in the last 72 hours. Thyroid function studies No results for input(s): TSH, T4TOTAL, T3FREE, THYROIDAB in the last 72 hours.  Invalid input(s): FREET3 Anemia work up No results for input(s): VITAMINB12, FOLATE, FERRITIN, TIBC, IRON, RETICCTPCT in the  last 72 hours. Urinalysis    Component Value Date/Time   COLORURINE YELLOW 11/27/2020 1540   APPEARANCEUR HAZY (A)  11/27/2020 1540   LABSPEC 1.026 11/27/2020 1540   PHURINE 5.0 11/27/2020 1540   GLUCOSEU NEGATIVE 11/27/2020 1540   HGBUR SMALL (A) 11/27/2020 1540   BILIRUBINUR NEGATIVE 11/27/2020 1540   KETONESUR 5 (A) 11/27/2020 1540   PROTEINUR 100 (A) 11/27/2020 1540   UROBILINOGEN 0.2 06/17/2008 1000   NITRITE NEGATIVE 11/27/2020 1540   LEUKOCYTESUR NEGATIVE 11/27/2020 1540   Sepsis Labs Invalid input(s): PROCALCITONIN,  WBC,  LACTICIDVEN Microbiology Recent Results (from the past 240 hour(s))  Resp Panel by RT-PCR (Flu A&B, Covid) Nasopharyngeal Swab     Status: None   Collection Time: 11/27/20  1:43 PM   Specimen: Nasopharyngeal Swab; Nasopharyngeal(NP) swabs in vial transport medium  Result Value Ref Range Status   SARS Coronavirus 2 by RT PCR NEGATIVE NEGATIVE Final    Comment: (NOTE) SARS-CoV-2 target nucleic acids are NOT DETECTED.  The SARS-CoV-2 RNA is generally detectable in upper respiratory specimens during the acute phase of infection. The lowest concentration of SARS-CoV-2 viral copies this assay can detect is 138 copies/mL. A negative result does not preclude SARS-Cov-2 infection and should not be used as the sole basis for treatment or other patient management decisions. A negative result may occur with  improper specimen collection/handling, submission of specimen other than nasopharyngeal swab, presence of viral mutation(s) within the areas targeted by this assay, and inadequate number of viral copies(<138 copies/mL). A negative result must be combined with clinical observations, patient history, and epidemiological information. The expected result is Negative.  Fact Sheet for Patients:  EntrepreneurPulse.com.au  Fact Sheet for Healthcare Providers:  IncredibleEmployment.be  This test is no t yet approved or cleared by the [place] FDA and  has been authorized for detection and/or diagnosis of SARS-CoV-2 by FDA under an  Emergency Use Authorization (EUA). This EUA will remain  in effect (meaning this test can be used) for the duration of the COVID-19 declaration under Section 564(b)(1) of the Act, 21 U.S.C.section 360bbb-3(b)(1), unless the authorization is terminated  or revoked sooner.       Influenza A by PCR NEGATIVE NEGATIVE Final   Influenza B by PCR NEGATIVE NEGATIVE Final    Comment: (NOTE) The Xpert Xpress SARS-CoV-2/FLU/RSV plus assay is intended as an aid in the diagnosis of influenza from Nasopharyngeal swab specimens and should not be used as a sole basis for treatment. Nasal washings and aspirates are unacceptable for Xpert Xpress SARS-CoV-2/FLU/RSV testing.  Fact Sheet for Patients: EntrepreneurPulse.com.au  Fact Sheet for Healthcare Providers: IncredibleEmployment.be  This test is not yet approved or cleared by the [place] FDA and has been authorized for detection and/or diagnosis of SARS-CoV-2 by FDA under an Emergency Use Authorization (EUA). This EUA will remain in effect (meaning this test can be used) for the duration of the COVID-19 declaration under Section 564(b)(1) of the Act, 21 U.S.C. section 360bbb-3(b)(1), unless the authorization is terminated or revoked.  Performed at [facility], [place] [address], [place], [place] [address]   Blood culture (routine x 2)     Status: None   Collection Time: 11/27/20  7:33 PM   Specimen: BLOOD  Result Value Ref Range Status   Specimen Description   Final    BLOOD SITE NOT SPECIFIED Performed at [facility], [address], [place], [place] [address]    Special Requests   Final  BOTTLES DRAWN AEROBIC AND ANAEROBIC Blood Culture adequate volume Performed at [facility] [address] [place], [place] [address]    Culture   Final    NO GROWTH 5 DAYS Performed at [facility], [place] [address], [place], [place] [address]    Report  Status 12/03/2020 FINAL  Final  Gastrointestinal Panel by PCR , Stool     Status: None   Collection Time: 11/27/20  7:40 PM   Specimen: Stool  Result Value Ref Range Status   Campylobacter species NOT DETECTED NOT DETECTED Final   Plesimonas shigelloides NOT DETECTED NOT DETECTED Final   Salmonella species NOT DETECTED NOT DETECTED Final   Yersinia enterocolitica NOT DETECTED NOT DETECTED Final   Vibrio species NOT DETECTED NOT DETECTED Final   Vibrio cholerae NOT DETECTED NOT DETECTED Final   Enteroaggregative E coli (EAEC) NOT DETECTED NOT DETECTED Final   Enteropathogenic E coli (EPEC) NOT DETECTED NOT DETECTED Final   Enterotoxigenic E coli (ETEC) NOT DETECTED NOT DETECTED Final   Shiga like toxin producing E coli (STEC) NOT DETECTED NOT DETECTED Final   Shigella/Enteroinvasive E coli (EIEC) NOT DETECTED NOT DETECTED Final   Cryptosporidium NOT DETECTED NOT DETECTED Final   Cyclospora cayetanensis NOT DETECTED NOT DETECTED Final   Entamoeba histolytica NOT DETECTED NOT DETECTED Final   Giardia lamblia NOT DETECTED NOT DETECTED Final   Adenovirus F40/41 NOT DETECTED NOT DETECTED Final   Astrovirus NOT DETECTED NOT DETECTED Final   Norovirus GI/GII NOT DETECTED NOT DETECTED Final   Rotavirus A NOT DETECTED NOT DETECTED Final   Sapovirus (I, II, IV, and V) NOT DETECTED NOT DETECTED Final    Comment: Performed at [facility], [address] [place], [place] [address]  C Difficile Quick Screen w PCR reflex     Status: None   Collection Time: 11/27/20  7:40 PM   Specimen: Stool  Result Value Ref Range Status   C Diff antigen NEGATIVE NEGATIVE Final   C Diff toxin NEGATIVE NEGATIVE Final   C Diff interpretation No C. difficile detected.  Final    Comment: Performed at [facility], [place] [address], [place], [place] [address]  Culture, blood (Routine X 2) w Reflex to ID Panel     Status: None (Preliminary result)   Collection Time: 11/30/20  5:07 AM    Specimen: BLOOD RIGHT HAND  Result Value Ref Range Status   Specimen Description   Final    BLOOD RIGHT HAND Performed at [facility] [address] [place], [place] [address]    Special Requests   Final    BOTTLES DRAWN AEROBIC ONLY Blood Culture adequate volume Performed at [facility] [address] [place], [place] [address]    Culture   Final    NO GROWTH 3 DAYS Performed at [facility], [place] [address] [place],  [address]    Report Status PENDING  Incomplete     Time coordinating discharge: Over 30 minutes  SIGNED:   Georgette Shell, MD  [facility] 12/03/2020, 4:32 PM

## 2020-12-05 ENCOUNTER — Ambulatory Visit: Payer: Medicare Other

## 2020-12-05 ENCOUNTER — Telehealth: Payer: Self-pay | Admitting: Radiation Oncology

## 2020-12-05 LAB — CULTURE, BLOOD (ROUTINE X 2)
Culture: NO GROWTH
Special Requests: ADEQUATE

## 2020-12-05 NOTE — Telephone Encounter (Signed)
-----   Message from Lolita Cram sent at 12/05/2020 10:13 AM EST ----- Regarding: PROBLEMS Hey Sam,   This patient's wife called and stated that this patient is having some problems and she wants to speak with you.  Ms. Kaneshiro phone number is [phone].  Thanks,  [employer]

## 2020-12-05 NOTE — Telephone Encounter (Addendum)
Received message from Darryl Nestle that POA, [place], request a return call. Also, received the following Outlook email from therapist on L3: Patient Jay Bailey ([id number]) did not show today and so we called just to check on him to make sure hes okay. His power of attorney answered the phone ( it was a lady but I forgot her name I'm sorry), and said that he is home from the hospital but still is in so much pain and cannot function. She stated that she talked with the urologist and said he suggested maybe putting in a stint before returning for radiation treatments. She would like to talk to Dr. Kathrynn Running or Lebron Quam as soon as they're able. She did want [place] to go ahead and cancel this week for sure. She said she will wait for a phone call from the Doctor.  Phoned to inquire. [name] confirms her brother in law was hospitalized for a bowel obstruction. She confirms he is home now recovering but still too weak to resume radiation. She request treatment be cancelled for this week. Stressed that large gaps in treatment may render the treatment less effective. She verbalized understanding then reinforces that "he is in pain and just can't make it." She denies being aware of the need for stents. Explained this RN will update Dr. Kathrynn Running and phone her with directions ie. If he can simply resume treatment or a replan needs to occur. Shelly verbalized understanding of all reviewed and appreciation for the return call.

## 2020-12-06 ENCOUNTER — Ambulatory Visit: Payer: Medicare Other

## 2020-12-07 ENCOUNTER — Telehealth: Payer: Self-pay | Admitting: Radiation Oncology

## 2020-12-07 ENCOUNTER — Ambulatory Visit: Payer: Medicare Other

## 2020-12-07 NOTE — Telephone Encounter (Signed)
Phoned sister in law and POA, [place]. Explained that Dr. Kathrynn Running would like the patient to resume radiation on Monday, January 10th at 1000. Explained that Dr. Kathrynn Running has narrowed the field of radiation thus reducing the potential greatly for GI irritation. She verbalized understanding and confirmed his appointment for Monday.

## 2020-12-08 ENCOUNTER — Ambulatory Visit: Payer: Medicare Other

## 2020-12-09 ENCOUNTER — Ambulatory Visit: Payer: Medicare Other

## 2020-12-12 ENCOUNTER — Ambulatory Visit: Payer: Medicare Other

## 2020-12-12 ENCOUNTER — Other Ambulatory Visit: Payer: Self-pay

## 2020-12-12 ENCOUNTER — Ambulatory Visit
Admission: RE | Admit: 2020-12-12 | Discharge: 2020-12-12 | Disposition: A | Discharge status: Home / Self Care | Payer: Medicare Other | Source: Ambulatory Visit | Attending: Radiation Oncology | Admitting: Radiation Oncology

## 2020-12-12 DIAGNOSIS — C61 Malignant neoplasm of prostate: Secondary | ICD-10-CM | POA: Insufficient documentation

## 2020-12-13 ENCOUNTER — Ambulatory Visit
Admission: RE | Admit: 2020-12-13 | Discharge: 2020-12-13 | Disposition: A | Discharge status: Home / Self Care | Payer: Medicare Other | Source: Ambulatory Visit | Attending: Radiation Oncology | Admitting: Radiation Oncology

## 2020-12-13 ENCOUNTER — Ambulatory Visit: Payer: Medicare Other

## 2020-12-13 ENCOUNTER — Ambulatory Visit: Payer: Medicare Other | Admitting: Podiatry

## 2020-12-13 DIAGNOSIS — C61 Malignant neoplasm of prostate: Secondary | ICD-10-CM | POA: Diagnosis not present

## 2020-12-14 ENCOUNTER — Ambulatory Visit: Payer: Medicare Other

## 2020-12-14 ENCOUNTER — Ambulatory Visit
Admission: RE | Admit: 2020-12-14 | Discharge: 2020-12-14 | Disposition: A | Discharge status: Home / Self Care | Payer: Medicare Other | Source: Ambulatory Visit | Attending: Radiation Oncology | Admitting: Radiation Oncology

## 2020-12-14 DIAGNOSIS — C61 Malignant neoplasm of prostate: Secondary | ICD-10-CM | POA: Diagnosis not present

## 2020-12-15 ENCOUNTER — Other Ambulatory Visit: Payer: Self-pay

## 2020-12-15 ENCOUNTER — Ambulatory Visit
Admission: RE | Admit: 2020-12-15 | Discharge: 2020-12-15 | Disposition: A | Discharge status: Home / Self Care | Payer: Medicare Other | Source: Ambulatory Visit | Attending: Radiation Oncology | Admitting: Radiation Oncology

## 2020-12-15 DIAGNOSIS — K56609 Unspecified intestinal obstruction, unspecified as to partial versus complete obstruction: Secondary | ICD-10-CM | POA: Diagnosis not present

## 2020-12-15 DIAGNOSIS — I739 Peripheral vascular disease, unspecified: Secondary | ICD-10-CM | POA: Diagnosis not present

## 2020-12-15 DIAGNOSIS — C61 Malignant neoplasm of prostate: Secondary | ICD-10-CM | POA: Diagnosis not present

## 2020-12-15 DIAGNOSIS — E1151 Type 2 diabetes mellitus with diabetic peripheral angiopathy without gangrene: Secondary | ICD-10-CM | POA: Diagnosis not present

## 2020-12-15 DIAGNOSIS — E876 Hypokalemia: Secondary | ICD-10-CM | POA: Diagnosis not present

## 2020-12-15 DIAGNOSIS — I10 Essential (primary) hypertension: Secondary | ICD-10-CM | POA: Diagnosis not present

## 2020-12-15 DIAGNOSIS — F325 Major depressive disorder, single episode, in full remission: Secondary | ICD-10-CM | POA: Diagnosis not present

## 2020-12-15 DIAGNOSIS — E785 Hyperlipidemia, unspecified: Secondary | ICD-10-CM | POA: Diagnosis not present

## 2020-12-15 DIAGNOSIS — Z85820 Personal history of malignant melanoma of skin: Secondary | ICD-10-CM | POA: Diagnosis not present

## 2020-12-15 DIAGNOSIS — E1140 Type 2 diabetes mellitus with diabetic neuropathy, unspecified: Secondary | ICD-10-CM | POA: Diagnosis not present

## 2020-12-16 ENCOUNTER — Ambulatory Visit
Admission: RE | Admit: 2020-12-16 | Discharge: 2020-12-16 | Disposition: A | Discharge status: Home / Self Care | Payer: Medicare Other | Source: Ambulatory Visit | Attending: Radiation Oncology | Admitting: Radiation Oncology

## 2020-12-16 ENCOUNTER — Other Ambulatory Visit: Payer: Self-pay

## 2020-12-16 DIAGNOSIS — C61 Malignant neoplasm of prostate: Secondary | ICD-10-CM | POA: Diagnosis not present

## 2020-12-19 ENCOUNTER — Ambulatory Visit: Payer: Medicare Other

## 2020-12-20 ENCOUNTER — Ambulatory Visit: Payer: Medicare Other

## 2020-12-21 ENCOUNTER — Ambulatory Visit
Admission: RE | Admit: 2020-12-21 | Discharge: 2020-12-21 | Disposition: A | Discharge status: Home / Self Care | Payer: Medicare Other | Source: Ambulatory Visit | Attending: Radiation Oncology | Admitting: Radiation Oncology

## 2020-12-21 DIAGNOSIS — C61 Malignant neoplasm of prostate: Secondary | ICD-10-CM | POA: Diagnosis not present

## 2020-12-22 ENCOUNTER — Ambulatory Visit
Admission: RE | Admit: 2020-12-22 | Discharge: 2020-12-22 | Disposition: A | Discharge status: Home / Self Care | Payer: Medicare Other | Source: Ambulatory Visit | Attending: Radiation Oncology | Admitting: Radiation Oncology

## 2020-12-22 DIAGNOSIS — C61 Malignant neoplasm of prostate: Secondary | ICD-10-CM | POA: Diagnosis not present

## 2020-12-23 ENCOUNTER — Ambulatory Visit
Admission: RE | Admit: 2020-12-23 | Discharge: 2020-12-23 | Disposition: A | Discharge status: Home / Self Care | Payer: Medicare Other | Source: Ambulatory Visit | Attending: Radiation Oncology | Admitting: Radiation Oncology

## 2020-12-23 ENCOUNTER — Other Ambulatory Visit: Payer: Self-pay

## 2020-12-23 DIAGNOSIS — C61 Malignant neoplasm of prostate: Secondary | ICD-10-CM | POA: Diagnosis not present

## 2020-12-26 ENCOUNTER — Ambulatory Visit
Admission: RE | Admit: 2020-12-26 | Discharge: 2020-12-26 | Disposition: A | Discharge status: Home / Self Care | Payer: Medicare Other | Source: Ambulatory Visit | Attending: Radiation Oncology | Admitting: Radiation Oncology

## 2020-12-26 DIAGNOSIS — C61 Malignant neoplasm of prostate: Secondary | ICD-10-CM | POA: Diagnosis not present

## 2020-12-27 ENCOUNTER — Ambulatory Visit
Admission: RE | Admit: 2020-12-27 | Discharge: 2020-12-27 | Disposition: A | Discharge status: Home / Self Care | Payer: Medicare Other | Source: Ambulatory Visit | Attending: Radiation Oncology | Admitting: Radiation Oncology

## 2020-12-27 ENCOUNTER — Ambulatory Visit: Payer: Medicare Other

## 2020-12-27 DIAGNOSIS — C61 Malignant neoplasm of prostate: Secondary | ICD-10-CM | POA: Diagnosis not present

## 2020-12-28 ENCOUNTER — Other Ambulatory Visit: Payer: Self-pay

## 2020-12-28 ENCOUNTER — Ambulatory Visit
Admission: RE | Admit: 2020-12-28 | Discharge: 2020-12-28 | Disposition: A | Discharge status: Home / Self Care | Payer: Medicare Other | Source: Ambulatory Visit | Attending: Radiation Oncology | Admitting: Radiation Oncology

## 2020-12-28 ENCOUNTER — Ambulatory Visit: Payer: Medicare Other

## 2020-12-28 DIAGNOSIS — N289 Disorder of kidney and ureter, unspecified: Secondary | ICD-10-CM | POA: Diagnosis not present

## 2020-12-28 DIAGNOSIS — Z111 Encounter for screening for respiratory tuberculosis: Secondary | ICD-10-CM | POA: Diagnosis not present

## 2020-12-28 DIAGNOSIS — C61 Malignant neoplasm of prostate: Secondary | ICD-10-CM | POA: Diagnosis not present

## 2020-12-29 ENCOUNTER — Ambulatory Visit: Payer: Medicare Other

## 2020-12-29 ENCOUNTER — Ambulatory Visit
Admission: RE | Admit: 2020-12-29 | Discharge: 2020-12-29 | Disposition: A | Discharge status: Home / Self Care | Payer: Medicare Other | Source: Ambulatory Visit | Attending: Radiation Oncology | Admitting: Radiation Oncology

## 2020-12-29 DIAGNOSIS — C61 Malignant neoplasm of prostate: Secondary | ICD-10-CM | POA: Diagnosis not present

## 2020-12-30 ENCOUNTER — Ambulatory Visit: Payer: Medicare Other

## 2020-12-30 ENCOUNTER — Ambulatory Visit
Admission: RE | Admit: 2020-12-30 | Discharge: 2020-12-30 | Disposition: A | Discharge status: Home / Self Care | Payer: Medicare Other | Source: Ambulatory Visit | Attending: Radiation Oncology | Admitting: Radiation Oncology

## 2020-12-30 DIAGNOSIS — C61 Malignant neoplasm of prostate: Secondary | ICD-10-CM | POA: Diagnosis not present

## 2021-01-02 ENCOUNTER — Ambulatory Visit: Payer: Medicare Other

## 2021-01-02 ENCOUNTER — Ambulatory Visit
Admission: RE | Admit: 2021-01-02 | Discharge: 2021-01-02 | Disposition: A | Discharge status: Home / Self Care | Payer: Medicare Other | Source: Ambulatory Visit | Attending: Radiation Oncology | Admitting: Radiation Oncology

## 2021-01-02 DIAGNOSIS — C61 Malignant neoplasm of prostate: Secondary | ICD-10-CM | POA: Diagnosis not present

## 2021-01-03 ENCOUNTER — Ambulatory Visit
Admission: RE | Admit: 2021-01-03 | Discharge: 2021-01-03 | Disposition: A | Discharge status: Home / Self Care | Payer: Medicare Other | Source: Ambulatory Visit | Attending: Radiation Oncology | Admitting: Radiation Oncology

## 2021-01-03 ENCOUNTER — Ambulatory Visit: Payer: Medicare Other

## 2021-01-03 DIAGNOSIS — C61 Malignant neoplasm of prostate: Secondary | ICD-10-CM | POA: Insufficient documentation

## 2021-01-04 ENCOUNTER — Other Ambulatory Visit: Payer: Self-pay

## 2021-01-04 ENCOUNTER — Ambulatory Visit
Admission: RE | Admit: 2021-01-04 | Discharge: 2021-01-04 | Disposition: A | Discharge status: Home / Self Care | Payer: Medicare Other | Source: Ambulatory Visit | Attending: Radiation Oncology | Admitting: Radiation Oncology

## 2021-01-04 DIAGNOSIS — C61 Malignant neoplasm of prostate: Secondary | ICD-10-CM | POA: Diagnosis not present

## 2021-01-05 ENCOUNTER — Ambulatory Visit
Admission: RE | Admit: 2021-01-05 | Discharge: 2021-01-05 | Disposition: A | Discharge status: Home / Self Care | Payer: Medicare Other | Source: Ambulatory Visit | Attending: Radiation Oncology | Admitting: Radiation Oncology

## 2021-01-05 DIAGNOSIS — C61 Malignant neoplasm of prostate: Secondary | ICD-10-CM | POA: Diagnosis not present

## 2021-01-06 ENCOUNTER — Ambulatory Visit
Admission: RE | Admit: 2021-01-06 | Discharge: 2021-01-06 | Disposition: A | Discharge status: Home / Self Care | Payer: Medicare Other | Source: Ambulatory Visit | Attending: Radiation Oncology | Admitting: Radiation Oncology

## 2021-01-06 DIAGNOSIS — C61 Malignant neoplasm of prostate: Secondary | ICD-10-CM | POA: Diagnosis not present

## 2021-01-07 ENCOUNTER — Ambulatory Visit: Payer: Medicare Other

## 2021-01-09 ENCOUNTER — Ambulatory Visit: Payer: Medicare Other

## 2021-01-10 ENCOUNTER — Ambulatory Visit: Payer: Medicare Other

## 2021-01-10 ENCOUNTER — Ambulatory Visit
Admission: RE | Admit: 2021-01-10 | Discharge: 2021-01-10 | Disposition: A | Discharge status: Home / Self Care | Payer: Medicare Other | Source: Ambulatory Visit | Attending: Radiation Oncology | Admitting: Radiation Oncology

## 2021-01-10 DIAGNOSIS — C61 Malignant neoplasm of prostate: Secondary | ICD-10-CM | POA: Diagnosis not present

## 2021-01-11 ENCOUNTER — Ambulatory Visit: Payer: Medicare Other

## 2021-01-11 ENCOUNTER — Ambulatory Visit
Admission: RE | Admit: 2021-01-11 | Discharge: 2021-01-11 | Disposition: A | Discharge status: Home / Self Care | Payer: Medicare Other | Source: Ambulatory Visit | Attending: Radiation Oncology | Admitting: Radiation Oncology

## 2021-01-11 DIAGNOSIS — C61 Malignant neoplasm of prostate: Secondary | ICD-10-CM | POA: Diagnosis not present

## 2021-01-12 ENCOUNTER — Encounter: Payer: Self-pay | Admitting: Radiation Oncology

## 2021-01-12 ENCOUNTER — Ambulatory Visit
Admission: RE | Admit: 2021-01-12 | Discharge: 2021-01-12 | Disposition: A | Discharge status: Home / Self Care | Payer: Medicare Other | Source: Ambulatory Visit | Attending: Radiation Oncology | Admitting: Radiation Oncology

## 2021-01-12 DIAGNOSIS — C61 Malignant neoplasm of prostate: Secondary | ICD-10-CM | POA: Diagnosis not present

## 2021-01-16 ENCOUNTER — Encounter: Payer: Self-pay | Admitting: Podiatry

## 2021-01-16 DIAGNOSIS — R262 Difficulty in walking, not elsewhere classified: Secondary | ICD-10-CM | POA: Diagnosis not present

## 2021-01-16 DIAGNOSIS — R2689 Other abnormalities of gait and mobility: Secondary | ICD-10-CM | POA: Diagnosis not present

## 2021-01-16 DIAGNOSIS — R2681 Unsteadiness on feet: Secondary | ICD-10-CM | POA: Diagnosis not present

## 2021-01-16 DIAGNOSIS — M6281 Muscle weakness (generalized): Secondary | ICD-10-CM | POA: Diagnosis not present

## 2021-01-16 DIAGNOSIS — N3941 Urge incontinence: Secondary | ICD-10-CM | POA: Diagnosis not present

## 2021-01-18 ENCOUNTER — Ambulatory Visit (INDEPENDENT_AMBULATORY_CARE_PROVIDER_SITE_OTHER): Payer: Medicare Other | Admitting: Podiatry

## 2021-01-18 ENCOUNTER — Other Ambulatory Visit: Payer: Self-pay

## 2021-01-18 ENCOUNTER — Encounter: Payer: Self-pay | Admitting: Podiatry

## 2021-01-18 DIAGNOSIS — R2681 Unsteadiness on feet: Secondary | ICD-10-CM | POA: Diagnosis not present

## 2021-01-18 DIAGNOSIS — M6281 Muscle weakness (generalized): Secondary | ICD-10-CM | POA: Diagnosis not present

## 2021-01-18 DIAGNOSIS — M79676 Pain in unspecified toe(s): Secondary | ICD-10-CM | POA: Diagnosis not present

## 2021-01-18 DIAGNOSIS — E785 Hyperlipidemia, unspecified: Secondary | ICD-10-CM | POA: Diagnosis not present

## 2021-01-18 DIAGNOSIS — B351 Tinea unguium: Secondary | ICD-10-CM | POA: Diagnosis not present

## 2021-01-18 DIAGNOSIS — E1151 Type 2 diabetes mellitus with diabetic peripheral angiopathy without gangrene: Secondary | ICD-10-CM | POA: Diagnosis not present

## 2021-01-18 DIAGNOSIS — E119 Type 2 diabetes mellitus without complications: Secondary | ICD-10-CM

## 2021-01-18 DIAGNOSIS — R262 Difficulty in walking, not elsewhere classified: Secondary | ICD-10-CM | POA: Diagnosis not present

## 2021-01-18 DIAGNOSIS — N3941 Urge incontinence: Secondary | ICD-10-CM | POA: Diagnosis not present

## 2021-01-18 DIAGNOSIS — I10 Essential (primary) hypertension: Secondary | ICD-10-CM | POA: Diagnosis not present

## 2021-01-18 DIAGNOSIS — R2689 Other abnormalities of gait and mobility: Secondary | ICD-10-CM | POA: Diagnosis not present

## 2021-01-18 DIAGNOSIS — F330 Major depressive disorder, recurrent, mild: Secondary | ICD-10-CM | POA: Diagnosis not present

## 2021-01-18 NOTE — Progress Notes (Signed)
This patient returns to my office for at risk foot care.  This patient requires this care by a professional since this patient will be at risk due to having  Diabetes.   This patient is unable to cut nails himself since the patient cannot reach his nails.These nails are painful walking and wearing shoes.  Patient is also concerned about swelling in his feet. He says there is no pain in his swollen feet.   This patient presents for at risk foot care today.  General Appearance  Alert, conversant and in no acute stress.  Vascular  Dorsalis pedis and posterior tibial  pulses are weakly  palpable  bilaterally.  Capillary return is within normal limits  bilaterally. Temperature is within normal limits  Bilaterally.  Venous stasis both legs.  Swelling feet.  Neurologic  Senn-Weinstein monofilament wire test within normal limits  bilaterally. Muscle power within normal limits bilaterally.  Nails Thick disfigured discolored nails with subungual debris  from hallux to fifth toes bilaterally. No evidence of bacterial infection or drainage bilaterally.  Orthopedic  No limitations of motion  feet .  No crepitus or effusions noted.  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  Prominent metatarsal heads  B/L.  Skin  normotropic skin with no porokeratosis noted bilaterally.  No signs of infections or ulcers noted.     Onychomycosis  Pain in right toes  Pain in left toes  Consent was obtained for treatment procedures.   Mechanical debridement of nails 1-5  bilaterally performed with a nail nipper.  Filed with dremel without incident.  Discussed swelling with this patient.  Told him I believe the swelling is due to venous stasis.  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   Return office visit    10 weeks                  Told patient to return for periodic foot care and evaluation due to potential at risk complications.   Gardiner Barefoot DPM

## 2021-01-18 NOTE — Patient Instructions (Signed)
Bradley and Daroff's neurology in clinical practice (8th ed., pp. 1853- 1929). Elsevier."> Goldman-Cecil medicine (26th ed., pp. 2489- 2501). Elsevier.">  Peripheral Neuropathy Peripheral neuropathy is a type of nerve damage. It affects nerves that carry signals between the spinal cord and the arms, legs, and the rest of the body (peripheral nerves). It does not affect nerves in the spinal cord or brain. In peripheral neuropathy, one nerve or a group of nerves may be damaged. Peripheral neuropathy is a broad category that includes many specific nerve disorders, like diabetic neuropathy, hereditary neuropathy, and carpal tunnel syndrome. What are the causes? This condition may be caused by:  Diabetes. This is the most common cause of peripheral neuropathy.  Nerve injury.  Pressure or stress on a nerve that lasts a long time.  Lack (deficiency) of B vitamins. This can result from alcoholism, poor diet, or a restricted diet.  Infections.  Autoimmune diseases, such as rheumatoid arthritis and systemic lupus erythematosus.  Nerve diseases that are passed from parent to child (inherited).  Some medicines, such as cancer medicines (chemotherapy).  Poisonous (toxic) substances, such as lead and mercury.  Too little blood flowing to the legs.  Kidney disease.  Thyroid disease. In some cases, the cause of this condition is not known. What are the signs or symptoms? Symptoms of this condition depend on which of your nerves is damaged. Common symptoms include:  Loss of feeling (numbness) in the feet, hands, or both.  Tingling in the feet, hands, or both.  Burning pain.  Very sensitive skin.  Weakness.  Not being able to move a part of the body (paralysis).  Muscle twitching.  Clumsiness or poor coordination.  Loss of balance.  Not being able to control your bladder.  Feeling dizzy.  Sexual problems. How is this diagnosed? Diagnosing and finding the cause of peripheral  neuropathy can be difficult. Your health care provider will take your medical history and do a physical exam. A neurological exam will also be done. This involves checking things that are affected by your brain, spinal cord, and nerves (nervous system). For example, your health care provider will check your reflexes, how you move, and what you can feel. You may have other tests, such as:  Blood tests.  Electromyogram (EMG) and nerve conduction tests. These tests check nerve function and how well the nerves are controlling the muscles.  Imaging tests, such as CT scans or MRI to rule out other causes of your symptoms.  Removing a small piece of nerve to be examined in a lab (nerve biopsy).  Removing and examining a small amount of the fluid that surrounds the brain and spinal cord (lumbar puncture). How is this treated? Treatment for this condition may involve:  Treating the underlying cause of the neuropathy, such as diabetes, kidney disease, or vitamin deficiencies.  Stopping medicines that can cause neuropathy, such as chemotherapy.  Medicine to help relieve pain. Medicines may include: ? Prescription or over-the-counter pain medicine. ? Antiseizure medicine. ? Antidepressants. ? Pain-relieving patches that are applied to painful areas of skin.  Surgery to relieve pressure on a nerve or to destroy a nerve that is causing pain.  Physical therapy to help improve movement and balance.  Devices to help you move around (assistive devices). Follow these instructions at home: Medicines  Take over-the-counter and prescription medicines only as told by your health care provider. Do not take any other medicines without first asking your health care provider.  Do not drive or use heavy   machinery while taking prescription pain medicine. Lifestyle  Do not use any products that contain nicotine or tobacco, such as cigarettes and e-cigarettes. Smoking keeps blood from reaching damaged nerves.  If you need help quitting, ask your health care provider.  Avoid or limit alcohol. Too much alcohol can cause a vitamin B deficiency, and vitamin B is needed for healthy nerves.  Eat a healthy diet. This includes: ? Eating foods that are high in fiber, such as fresh fruits and vegetables, whole grains, and beans. ? Limiting foods that are high in fat and processed sugars, such as fried or sweet foods.   General instructions  If you have diabetes, work closely with your health care provider to keep your blood sugar under control.  If you have numbness in your feet: ? Check every day for signs of injury or infection. Watch for redness, warmth, and swelling. ? Wear padded socks and comfortable shoes. These help protect your feet.  Develop a good support system. Living with peripheral neuropathy can be stressful. Consider talking with a mental health specialist or joining a support group.  Use assistive devices and attend physical therapy as told by your health care provider. This may include using a walker or a cane.  Keep all follow-up visits as told by your health care provider. This is important.   Contact a health care provider if:  You have new signs or symptoms of peripheral neuropathy.  You are struggling emotionally from dealing with peripheral neuropathy.  Your pain is not well-controlled. Get help right away if:  You have an injury or infection that is not healing normally.  You develop new weakness in an arm or leg.  You have fallen or do so frequently. Summary  Peripheral neuropathy is when the nerves in the arms, or legs are damaged, resulting in numbness, weakness, or pain.  There are many causes of peripheral neuropathy, including diabetes, pinched nerves, vitamin deficiencies, autoimmune disease, and hereditary conditions.  Diagnosing and finding the cause of peripheral neuropathy can be difficult. Your health care provider will take your medical history, do a  physical exam, and do tests, including blood tests and nerve function tests.  Treatment involves treating the underlying cause of the neuropathy and taking medicines to help control pain. Physical therapy and assistive devices may also help. This information is not intended to replace advice given to you by your health care provider. Make sure you discuss any questions you have with your health care provider. Document Revised: 08/30/2020 Document Reviewed: 08/30/2020 Elsevier Patient Education  2021 Elsevier Inc.  

## 2021-01-20 DIAGNOSIS — R262 Difficulty in walking, not elsewhere classified: Secondary | ICD-10-CM | POA: Diagnosis not present

## 2021-01-20 DIAGNOSIS — R2689 Other abnormalities of gait and mobility: Secondary | ICD-10-CM | POA: Diagnosis not present

## 2021-01-20 DIAGNOSIS — N3941 Urge incontinence: Secondary | ICD-10-CM | POA: Diagnosis not present

## 2021-01-20 DIAGNOSIS — M6281 Muscle weakness (generalized): Secondary | ICD-10-CM | POA: Diagnosis not present

## 2021-01-20 DIAGNOSIS — R2681 Unsteadiness on feet: Secondary | ICD-10-CM | POA: Diagnosis not present

## 2021-01-22 NOTE — Progress Notes (Signed)
  Radiation Oncology         [id number]) [phone] ________________________________  Name: Jay Bailey MRN: [id number]  Date: 01/12/2021  DOB: Apr 14, 1941  End of Treatment Note  Diagnosis:   80 y.o. gentleman with biochemical recurrence of prostate cancer with rising post-op PSA of 12.39 s/p RRP in 01/2004 for Stage pT2a, pN0 Gleason 3+4 prostate cancer, with Axumin PET showing fossa recurrence.  Indication for treatment:  Curative, Definitive Radiotherapy       Radiation treatment dates:   11/02/20-01/12/21  Site/dose:  1. The prostate fossa and pelvic lymph nodes were initially treated to 30.6 Gy in 17 fractions of 1.8 Gy  2. The prostate fossa only was boosted to 68.4 Gy with 21 additional fractions of 1.8 Gy   Beams/energy:  1. The prostate fossa  and pelvic lymph nodes were initially treated using VMAT intensity modulated radiotherapy delivering 6 megavolt photons. Image guidance was performed with CB-CT studies prior to each fraction. He was immobilized with a body fix lower extremity mold.  2. The prostate fossa only was boosted using VMAT intensity modulated radiotherapy delivering 6 megavolt photons. Image guidance was performed with CB-CT studies prior to each fraction. He was immobilized with a body fix lower extremity mold.  Narrative: The patient tolerated radiation treatment relatively well.   The patient experienced some minor urinary irritation and modest fatigue.  Most notably, he experienced a partial bowel obstruction on 11/24/20 requiring hospitalization and bowel rest.  This created an almost 2 week break in radiation treatment, and resulted in an early transition from pelvic node and fossa coverage to the fossa boost.  Plan: The patient has completed radiation treatment. He will return to radiation oncology clinic for routine followup in one month. I advised him to call or return sooner if he has any questions or concerns related to his recovery or  treatment. ________________________________  Sheral Apley. Tammi Klippel, M.D.

## 2021-01-23 DIAGNOSIS — M6281 Muscle weakness (generalized): Secondary | ICD-10-CM | POA: Diagnosis not present

## 2021-01-23 DIAGNOSIS — R262 Difficulty in walking, not elsewhere classified: Secondary | ICD-10-CM | POA: Diagnosis not present

## 2021-01-23 DIAGNOSIS — N3941 Urge incontinence: Secondary | ICD-10-CM | POA: Diagnosis not present

## 2021-01-23 DIAGNOSIS — R2681 Unsteadiness on feet: Secondary | ICD-10-CM | POA: Diagnosis not present

## 2021-01-23 DIAGNOSIS — R2689 Other abnormalities of gait and mobility: Secondary | ICD-10-CM | POA: Diagnosis not present

## 2021-01-24 DIAGNOSIS — M6281 Muscle weakness (generalized): Secondary | ICD-10-CM | POA: Diagnosis not present

## 2021-01-24 DIAGNOSIS — N3941 Urge incontinence: Secondary | ICD-10-CM | POA: Diagnosis not present

## 2021-01-24 DIAGNOSIS — R2681 Unsteadiness on feet: Secondary | ICD-10-CM | POA: Diagnosis not present

## 2021-01-24 DIAGNOSIS — R2689 Other abnormalities of gait and mobility: Secondary | ICD-10-CM | POA: Diagnosis not present

## 2021-01-24 DIAGNOSIS — R262 Difficulty in walking, not elsewhere classified: Secondary | ICD-10-CM | POA: Diagnosis not present

## 2021-01-25 DIAGNOSIS — R262 Difficulty in walking, not elsewhere classified: Secondary | ICD-10-CM | POA: Diagnosis not present

## 2021-01-25 DIAGNOSIS — N3941 Urge incontinence: Secondary | ICD-10-CM | POA: Diagnosis not present

## 2021-01-25 DIAGNOSIS — M6281 Muscle weakness (generalized): Secondary | ICD-10-CM | POA: Diagnosis not present

## 2021-01-25 DIAGNOSIS — R2681 Unsteadiness on feet: Secondary | ICD-10-CM | POA: Diagnosis not present

## 2021-01-25 DIAGNOSIS — R2689 Other abnormalities of gait and mobility: Secondary | ICD-10-CM | POA: Diagnosis not present

## 2021-01-26 DIAGNOSIS — R2681 Unsteadiness on feet: Secondary | ICD-10-CM | POA: Diagnosis not present

## 2021-01-26 DIAGNOSIS — M6281 Muscle weakness (generalized): Secondary | ICD-10-CM | POA: Diagnosis not present

## 2021-01-26 DIAGNOSIS — N3941 Urge incontinence: Secondary | ICD-10-CM | POA: Diagnosis not present

## 2021-01-26 DIAGNOSIS — R2689 Other abnormalities of gait and mobility: Secondary | ICD-10-CM | POA: Diagnosis not present

## 2021-01-26 DIAGNOSIS — R262 Difficulty in walking, not elsewhere classified: Secondary | ICD-10-CM | POA: Diagnosis not present

## 2021-01-27 DIAGNOSIS — R262 Difficulty in walking, not elsewhere classified: Secondary | ICD-10-CM | POA: Diagnosis not present

## 2021-01-27 DIAGNOSIS — R2689 Other abnormalities of gait and mobility: Secondary | ICD-10-CM | POA: Diagnosis not present

## 2021-01-27 DIAGNOSIS — M6281 Muscle weakness (generalized): Secondary | ICD-10-CM | POA: Diagnosis not present

## 2021-01-27 DIAGNOSIS — R2681 Unsteadiness on feet: Secondary | ICD-10-CM | POA: Diagnosis not present

## 2021-01-27 DIAGNOSIS — N3941 Urge incontinence: Secondary | ICD-10-CM | POA: Diagnosis not present

## 2021-01-30 DIAGNOSIS — N3941 Urge incontinence: Secondary | ICD-10-CM | POA: Diagnosis not present

## 2021-01-30 DIAGNOSIS — R262 Difficulty in walking, not elsewhere classified: Secondary | ICD-10-CM | POA: Diagnosis not present

## 2021-01-30 DIAGNOSIS — R2689 Other abnormalities of gait and mobility: Secondary | ICD-10-CM | POA: Diagnosis not present

## 2021-01-30 DIAGNOSIS — M6281 Muscle weakness (generalized): Secondary | ICD-10-CM | POA: Diagnosis not present

## 2021-01-30 DIAGNOSIS — R2681 Unsteadiness on feet: Secondary | ICD-10-CM | POA: Diagnosis not present

## 2021-01-31 DIAGNOSIS — R2681 Unsteadiness on feet: Secondary | ICD-10-CM | POA: Diagnosis not present

## 2021-01-31 DIAGNOSIS — R262 Difficulty in walking, not elsewhere classified: Secondary | ICD-10-CM | POA: Diagnosis not present

## 2021-01-31 DIAGNOSIS — N3941 Urge incontinence: Secondary | ICD-10-CM | POA: Diagnosis not present

## 2021-01-31 DIAGNOSIS — M6281 Muscle weakness (generalized): Secondary | ICD-10-CM | POA: Diagnosis not present

## 2021-01-31 DIAGNOSIS — R2689 Other abnormalities of gait and mobility: Secondary | ICD-10-CM | POA: Diagnosis not present

## 2021-02-01 DIAGNOSIS — R2689 Other abnormalities of gait and mobility: Secondary | ICD-10-CM | POA: Diagnosis not present

## 2021-02-01 DIAGNOSIS — M6281 Muscle weakness (generalized): Secondary | ICD-10-CM | POA: Diagnosis not present

## 2021-02-01 DIAGNOSIS — N3941 Urge incontinence: Secondary | ICD-10-CM | POA: Diagnosis not present

## 2021-02-01 DIAGNOSIS — R2681 Unsteadiness on feet: Secondary | ICD-10-CM | POA: Diagnosis not present

## 2021-02-01 DIAGNOSIS — R262 Difficulty in walking, not elsewhere classified: Secondary | ICD-10-CM | POA: Diagnosis not present

## 2021-02-02 DIAGNOSIS — R262 Difficulty in walking, not elsewhere classified: Secondary | ICD-10-CM | POA: Diagnosis not present

## 2021-02-02 DIAGNOSIS — R2689 Other abnormalities of gait and mobility: Secondary | ICD-10-CM | POA: Diagnosis not present

## 2021-02-02 DIAGNOSIS — M6281 Muscle weakness (generalized): Secondary | ICD-10-CM | POA: Diagnosis not present

## 2021-02-02 DIAGNOSIS — N3941 Urge incontinence: Secondary | ICD-10-CM | POA: Diagnosis not present

## 2021-02-02 DIAGNOSIS — R2681 Unsteadiness on feet: Secondary | ICD-10-CM | POA: Diagnosis not present

## 2021-02-03 DIAGNOSIS — R2689 Other abnormalities of gait and mobility: Secondary | ICD-10-CM | POA: Diagnosis not present

## 2021-02-03 DIAGNOSIS — M6281 Muscle weakness (generalized): Secondary | ICD-10-CM | POA: Diagnosis not present

## 2021-02-03 DIAGNOSIS — N3941 Urge incontinence: Secondary | ICD-10-CM | POA: Diagnosis not present

## 2021-02-03 DIAGNOSIS — R262 Difficulty in walking, not elsewhere classified: Secondary | ICD-10-CM | POA: Diagnosis not present

## 2021-02-03 DIAGNOSIS — R2681 Unsteadiness on feet: Secondary | ICD-10-CM | POA: Diagnosis not present

## 2021-02-06 ENCOUNTER — Telehealth: Payer: Self-pay

## 2021-02-06 DIAGNOSIS — N3941 Urge incontinence: Secondary | ICD-10-CM | POA: Diagnosis not present

## 2021-02-06 DIAGNOSIS — R262 Difficulty in walking, not elsewhere classified: Secondary | ICD-10-CM | POA: Diagnosis not present

## 2021-02-06 DIAGNOSIS — R2689 Other abnormalities of gait and mobility: Secondary | ICD-10-CM | POA: Diagnosis not present

## 2021-02-06 DIAGNOSIS — M6281 Muscle weakness (generalized): Secondary | ICD-10-CM | POA: Diagnosis not present

## 2021-02-06 DIAGNOSIS — R2681 Unsteadiness on feet: Secondary | ICD-10-CM | POA: Diagnosis not present

## 2021-02-06 NOTE — Telephone Encounter (Signed)
Attempted to contact patient in regards telephone visit with Freeman Caldron PA on 02/08/21 @ 9:00am. Unable to leave a message. Called to review meaningful use, AUA and prostate questions. TM

## 2021-02-08 ENCOUNTER — Telehealth: Payer: Self-pay

## 2021-02-08 ENCOUNTER — Other Ambulatory Visit: Payer: Self-pay

## 2021-02-08 ENCOUNTER — Ambulatory Visit
Admission: RE | Admit: 2021-02-08 | Discharge: 2021-02-08 | Disposition: A | Discharge status: Home / Self Care | Payer: Medicare Other | Source: Ambulatory Visit | Attending: Urology | Admitting: Urology

## 2021-02-08 DIAGNOSIS — N3941 Urge incontinence: Secondary | ICD-10-CM | POA: Diagnosis not present

## 2021-02-08 DIAGNOSIS — R2689 Other abnormalities of gait and mobility: Secondary | ICD-10-CM | POA: Diagnosis not present

## 2021-02-08 DIAGNOSIS — R2681 Unsteadiness on feet: Secondary | ICD-10-CM | POA: Diagnosis not present

## 2021-02-08 DIAGNOSIS — C61 Malignant neoplasm of prostate: Secondary | ICD-10-CM

## 2021-02-08 DIAGNOSIS — M6281 Muscle weakness (generalized): Secondary | ICD-10-CM | POA: Diagnosis not present

## 2021-02-08 DIAGNOSIS — R262 Difficulty in walking, not elsewhere classified: Secondary | ICD-10-CM | POA: Diagnosis not present

## 2021-02-08 NOTE — Progress Notes (Signed)
Patient has a telephone visit with [employer] PA. Patient states nocturia 1 time per night. Patient denies dysuria. Patient states that he has a normal urine stream and sits to urinate. Patient states that he is emptying his bladder completely. Patient denies urgency. Patient denies pushing or straining. Patient denies fatigue. Patient states occasional leakage. Patient states that he does not have a follow-up with [facility].

## 2021-02-08 NOTE — Telephone Encounter (Signed)
Called patient home and mobile number in regards to telephone appointment today with Freeman Caldron PA @ 9:00am. Could not leave a message in regards to appointment. Called to review meaningful use, AUA and prostate questions. TM

## 2021-02-08 NOTE — Progress Notes (Signed)
Radiation Oncology         [phone] ________________________________  Name: Jay Bailey MRN: [id number]  Date: 02/08/2021  DOB: [birth year]  Post Treatment Note  CC: Harlan Stains, MD  Myrlene Broker, MD  Diagnosis:   80 y.o. gentleman with biochemical recurrence of prostate cancer with rising post-op PSA of 12.39 s/p RRP in 01/2004 for Stage pT2a, pN0 Gleason 3+4 prostate cancer, with Axumin PET showing fossa recurrence.  Interval Since Last Radiation:  4 weeks  11/02/20-01/12/21: 1. The prostate fossa and pelvic lymph nodes were initially treated to 30.6 Gy in 17 fractions of 1.8 Gy  2. The prostate fossa only was boosted to 68.4 Gy with 21 additional fractions of 1.8 Gy   Narrative:  The patient returns today for routine follow-up.  He tolerated radiation treatment relatively well with minor urinary irritation and modest fatigue. Most notably, he experienced a partial bowel obstruction on 11/24/20 requiring hospitalization and bowel rest.  This created an almost 2 week break in radiation treatment, and resulted in an early transition from pelvic node and fossa coverage to the fossa boost.                              On review of systems, the patient states that he is doing well in general.  He reports occasional bowel irritation but feels that he is almost back to his baseline at this point.  He denies any residual LUTS, specifically denies dysuria, gross hematuria, straining to void, incomplete bladder emptying or incontinence.  His current IPSS score is 1, indicating mild urinary symptoms.  He denies abdominal pain, nausea, vomiting, diarrhea or constipation.  He reports gradual improvement in his energy level and is overall pleased with his progress to date.  ALLERGIES:  is allergic to lisinopril.  Meds: Current Outpatient Medications  Medication Sig Dispense Refill  . ACCU-CHEK GUIDE test strip     . amLODipine (NORVASC) 2.5 MG tablet Take 2.5 mg by mouth daily.    Marland Kitchen  amoxicillin-clavulanate (AUGMENTIN) 875-125 MG tablet Take 1 tablet by mouth every 12 (twelve) hours. 18 tablet 0  . ascorbic acid (VITAMIN C) 1000 MG tablet Take 1,000 mg by mouth daily.    Marland Kitchen atorvastatin (LIPITOR) 80 MG tablet Take 80 mg by mouth daily.    Marland Kitchen buPROPion (WELLBUTRIN XL) 150 MG 24 hr tablet 1 tablet every morning    . folic acid (FOLVITE) 798 MCG tablet Take 400 mcg by mouth daily.     . irbesartan-hydrochlorothiazide (AVALIDE) 150-12.5 MG tablet Take 1 tablet by mouth daily.    Marland Kitchen loperamide (IMODIUM) 2 MG capsule Take 2 mg by mouth daily as needed for diarrhea or loose stools.    . metFORMIN (GLUCOPHAGE-XR) 500 MG 24 hr tablet Take 500 mg by mouth daily with breakfast.    . Multiple Vitamin (MULTIVITAMIN) tablet Take 1 tablet by mouth daily.    . ondansetron (ZOFRAN) 4 MG tablet Take 1 tablet (4 mg total) by mouth every 6 (six) hours as needed for nausea. 20 tablet 0  . oxybutynin (DITROPAN-XL) 5 MG 24 hr tablet Take 1 tablet by mouth daily.    . pantoprazole (PROTONIX) 40 MG tablet Take 1 tablet (40 mg total) by mouth daily. 30 tablet 1  . prochlorperazine (COMPAZINE) 10 MG tablet Take 1 tablet (10 mg total) by mouth every 6 (six) hours as needed for nausea or vomiting. 30 tablet 0  .  vitamin E 400 UNIT capsule Take 400 Units by mouth daily.     No current facility-administered medications for this encounter.    Physical Findings:  vitals were not taken for this visit.  Unable to assess due to telephone follow-up visit format.  Lab Findings: Lab Results  Component Value Date   WBC 6.0 12/01/2020   HGB 11.3 (L) 12/01/2020   HCT 33.6 (L) 12/01/2020   MCV 96.6 12/01/2020   PLT 138 (L) 12/01/2020     Radiographic Findings: No results found.  Impression/Plan: 1. 80 y.o. gentleman with biochemical recurrence of prostate cancer with rising post-op PSA of 12.39 s/p RRP in 01/2004 for Stage pT2a, pN0 Gleason 3+4 prostate cancer, with Axumin PET showing fossa  recurrence.  He will continue to follow up with urology for ongoing PSA determinations and does not currently have an appointment scheduled with Dr. Rosana Hoes to his knowledge but anticipates a follow-up visit sometime in May 2022. He understands what to expect with regards to PSA monitoring going forward. I will look forward to following his response to treatment via correspondence with urology, and would be happy to continue to participate in his care if clinically indicated. I talked to the patient about what to expect in the future, including his risk for erectile dysfunction and rectal bleeding. I encouraged him to call or return to the office if he has any questions regarding his previous radiation or possible radiation side effects. He was comfortable with this plan and will follow up as needed.    Nicholos Johns, PA-C

## 2021-02-08 NOTE — Telephone Encounter (Signed)
Left message with sister Sherill Wegener in regards to telephone visit Ashlyn Bruning PA today @ 9:00am. Sister gave contact number to reach patient. TM

## 2021-02-10 DIAGNOSIS — R2689 Other abnormalities of gait and mobility: Secondary | ICD-10-CM | POA: Diagnosis not present

## 2021-02-10 DIAGNOSIS — M6281 Muscle weakness (generalized): Secondary | ICD-10-CM | POA: Diagnosis not present

## 2021-02-10 DIAGNOSIS — R262 Difficulty in walking, not elsewhere classified: Secondary | ICD-10-CM | POA: Diagnosis not present

## 2021-02-10 DIAGNOSIS — R2681 Unsteadiness on feet: Secondary | ICD-10-CM | POA: Diagnosis not present

## 2021-02-10 DIAGNOSIS — N3941 Urge incontinence: Secondary | ICD-10-CM | POA: Diagnosis not present

## 2021-02-13 DIAGNOSIS — M6281 Muscle weakness (generalized): Secondary | ICD-10-CM | POA: Diagnosis not present

## 2021-02-13 DIAGNOSIS — R2689 Other abnormalities of gait and mobility: Secondary | ICD-10-CM | POA: Diagnosis not present

## 2021-02-13 DIAGNOSIS — N3941 Urge incontinence: Secondary | ICD-10-CM | POA: Diagnosis not present

## 2021-02-13 DIAGNOSIS — R262 Difficulty in walking, not elsewhere classified: Secondary | ICD-10-CM | POA: Diagnosis not present

## 2021-02-13 DIAGNOSIS — R2681 Unsteadiness on feet: Secondary | ICD-10-CM | POA: Diagnosis not present

## 2021-02-14 DIAGNOSIS — R2689 Other abnormalities of gait and mobility: Secondary | ICD-10-CM | POA: Diagnosis not present

## 2021-02-14 DIAGNOSIS — N3941 Urge incontinence: Secondary | ICD-10-CM | POA: Diagnosis not present

## 2021-02-14 DIAGNOSIS — R2681 Unsteadiness on feet: Secondary | ICD-10-CM | POA: Diagnosis not present

## 2021-02-14 DIAGNOSIS — R262 Difficulty in walking, not elsewhere classified: Secondary | ICD-10-CM | POA: Diagnosis not present

## 2021-02-14 DIAGNOSIS — M6281 Muscle weakness (generalized): Secondary | ICD-10-CM | POA: Diagnosis not present

## 2021-02-16 DIAGNOSIS — R2681 Unsteadiness on feet: Secondary | ICD-10-CM | POA: Diagnosis not present

## 2021-02-16 DIAGNOSIS — R262 Difficulty in walking, not elsewhere classified: Secondary | ICD-10-CM | POA: Diagnosis not present

## 2021-02-16 DIAGNOSIS — M6281 Muscle weakness (generalized): Secondary | ICD-10-CM | POA: Diagnosis not present

## 2021-02-16 DIAGNOSIS — R2689 Other abnormalities of gait and mobility: Secondary | ICD-10-CM | POA: Diagnosis not present

## 2021-02-16 DIAGNOSIS — N3941 Urge incontinence: Secondary | ICD-10-CM | POA: Diagnosis not present

## 2021-02-17 DIAGNOSIS — R262 Difficulty in walking, not elsewhere classified: Secondary | ICD-10-CM | POA: Diagnosis not present

## 2021-02-17 DIAGNOSIS — N3941 Urge incontinence: Secondary | ICD-10-CM | POA: Diagnosis not present

## 2021-02-17 DIAGNOSIS — M6281 Muscle weakness (generalized): Secondary | ICD-10-CM | POA: Diagnosis not present

## 2021-02-17 DIAGNOSIS — R2681 Unsteadiness on feet: Secondary | ICD-10-CM | POA: Diagnosis not present

## 2021-02-17 DIAGNOSIS — R2689 Other abnormalities of gait and mobility: Secondary | ICD-10-CM | POA: Diagnosis not present

## 2021-02-20 DIAGNOSIS — Z20828 Contact with and (suspected) exposure to other viral communicable diseases: Secondary | ICD-10-CM | POA: Diagnosis not present

## 2021-02-20 DIAGNOSIS — F330 Major depressive disorder, recurrent, mild: Secondary | ICD-10-CM | POA: Diagnosis not present

## 2021-02-20 DIAGNOSIS — Z1159 Encounter for screening for other viral diseases: Secondary | ICD-10-CM | POA: Diagnosis not present

## 2021-02-21 DIAGNOSIS — N3941 Urge incontinence: Secondary | ICD-10-CM | POA: Diagnosis not present

## 2021-02-21 DIAGNOSIS — R262 Difficulty in walking, not elsewhere classified: Secondary | ICD-10-CM | POA: Diagnosis not present

## 2021-02-21 DIAGNOSIS — R2681 Unsteadiness on feet: Secondary | ICD-10-CM | POA: Diagnosis not present

## 2021-02-21 DIAGNOSIS — M6281 Muscle weakness (generalized): Secondary | ICD-10-CM | POA: Diagnosis not present

## 2021-02-21 DIAGNOSIS — R2689 Other abnormalities of gait and mobility: Secondary | ICD-10-CM | POA: Diagnosis not present

## 2021-02-22 DIAGNOSIS — N3941 Urge incontinence: Secondary | ICD-10-CM | POA: Diagnosis not present

## 2021-02-22 DIAGNOSIS — R2681 Unsteadiness on feet: Secondary | ICD-10-CM | POA: Diagnosis not present

## 2021-02-22 DIAGNOSIS — R2689 Other abnormalities of gait and mobility: Secondary | ICD-10-CM | POA: Diagnosis not present

## 2021-02-22 DIAGNOSIS — M6281 Muscle weakness (generalized): Secondary | ICD-10-CM | POA: Diagnosis not present

## 2021-02-22 DIAGNOSIS — R262 Difficulty in walking, not elsewhere classified: Secondary | ICD-10-CM | POA: Diagnosis not present

## 2021-02-23 DIAGNOSIS — R2681 Unsteadiness on feet: Secondary | ICD-10-CM | POA: Diagnosis not present

## 2021-02-23 DIAGNOSIS — N3941 Urge incontinence: Secondary | ICD-10-CM | POA: Diagnosis not present

## 2021-02-23 DIAGNOSIS — R262 Difficulty in walking, not elsewhere classified: Secondary | ICD-10-CM | POA: Diagnosis not present

## 2021-02-23 DIAGNOSIS — R2689 Other abnormalities of gait and mobility: Secondary | ICD-10-CM | POA: Diagnosis not present

## 2021-02-23 DIAGNOSIS — M6281 Muscle weakness (generalized): Secondary | ICD-10-CM | POA: Diagnosis not present

## 2021-02-28 DIAGNOSIS — R2689 Other abnormalities of gait and mobility: Secondary | ICD-10-CM | POA: Diagnosis not present

## 2021-02-28 DIAGNOSIS — R262 Difficulty in walking, not elsewhere classified: Secondary | ICD-10-CM | POA: Diagnosis not present

## 2021-02-28 DIAGNOSIS — N3941 Urge incontinence: Secondary | ICD-10-CM | POA: Diagnosis not present

## 2021-02-28 DIAGNOSIS — R2681 Unsteadiness on feet: Secondary | ICD-10-CM | POA: Diagnosis not present

## 2021-02-28 DIAGNOSIS — M6281 Muscle weakness (generalized): Secondary | ICD-10-CM | POA: Diagnosis not present

## 2021-03-01 DIAGNOSIS — N3941 Urge incontinence: Secondary | ICD-10-CM | POA: Diagnosis not present

## 2021-03-01 DIAGNOSIS — M6281 Muscle weakness (generalized): Secondary | ICD-10-CM | POA: Diagnosis not present

## 2021-03-01 DIAGNOSIS — R2681 Unsteadiness on feet: Secondary | ICD-10-CM | POA: Diagnosis not present

## 2021-03-01 DIAGNOSIS — R262 Difficulty in walking, not elsewhere classified: Secondary | ICD-10-CM | POA: Diagnosis not present

## 2021-03-01 DIAGNOSIS — R2689 Other abnormalities of gait and mobility: Secondary | ICD-10-CM | POA: Diagnosis not present

## 2021-03-03 DIAGNOSIS — R262 Difficulty in walking, not elsewhere classified: Secondary | ICD-10-CM | POA: Diagnosis not present

## 2021-03-03 DIAGNOSIS — R2681 Unsteadiness on feet: Secondary | ICD-10-CM | POA: Diagnosis not present

## 2021-03-03 DIAGNOSIS — R2689 Other abnormalities of gait and mobility: Secondary | ICD-10-CM | POA: Diagnosis not present

## 2021-03-03 DIAGNOSIS — M6281 Muscle weakness (generalized): Secondary | ICD-10-CM | POA: Diagnosis not present

## 2021-03-03 DIAGNOSIS — N3941 Urge incontinence: Secondary | ICD-10-CM | POA: Diagnosis not present

## 2021-03-06 DIAGNOSIS — M6281 Muscle weakness (generalized): Secondary | ICD-10-CM | POA: Diagnosis not present

## 2021-03-06 DIAGNOSIS — N3941 Urge incontinence: Secondary | ICD-10-CM | POA: Diagnosis not present

## 2021-03-06 DIAGNOSIS — R2681 Unsteadiness on feet: Secondary | ICD-10-CM | POA: Diagnosis not present

## 2021-03-06 DIAGNOSIS — R262 Difficulty in walking, not elsewhere classified: Secondary | ICD-10-CM | POA: Diagnosis not present

## 2021-03-06 DIAGNOSIS — R2689 Other abnormalities of gait and mobility: Secondary | ICD-10-CM | POA: Diagnosis not present

## 2021-03-10 DIAGNOSIS — N3941 Urge incontinence: Secondary | ICD-10-CM | POA: Diagnosis not present

## 2021-03-10 DIAGNOSIS — R2689 Other abnormalities of gait and mobility: Secondary | ICD-10-CM | POA: Diagnosis not present

## 2021-03-10 DIAGNOSIS — R262 Difficulty in walking, not elsewhere classified: Secondary | ICD-10-CM | POA: Diagnosis not present

## 2021-03-10 DIAGNOSIS — M6281 Muscle weakness (generalized): Secondary | ICD-10-CM | POA: Diagnosis not present

## 2021-03-10 DIAGNOSIS — R2681 Unsteadiness on feet: Secondary | ICD-10-CM | POA: Diagnosis not present

## 2021-03-13 DIAGNOSIS — L570 Actinic keratosis: Secondary | ICD-10-CM | POA: Diagnosis not present

## 2021-03-13 DIAGNOSIS — N3941 Urge incontinence: Secondary | ICD-10-CM | POA: Diagnosis not present

## 2021-03-13 DIAGNOSIS — Z85820 Personal history of malignant melanoma of skin: Secondary | ICD-10-CM | POA: Diagnosis not present

## 2021-03-13 DIAGNOSIS — D225 Melanocytic nevi of trunk: Secondary | ICD-10-CM | POA: Diagnosis not present

## 2021-03-13 DIAGNOSIS — L814 Other melanin hyperpigmentation: Secondary | ICD-10-CM | POA: Diagnosis not present

## 2021-03-13 DIAGNOSIS — Z1159 Encounter for screening for other viral diseases: Secondary | ICD-10-CM | POA: Diagnosis not present

## 2021-03-13 DIAGNOSIS — Z20828 Contact with and (suspected) exposure to other viral communicable diseases: Secondary | ICD-10-CM | POA: Diagnosis not present

## 2021-03-13 DIAGNOSIS — R262 Difficulty in walking, not elsewhere classified: Secondary | ICD-10-CM | POA: Diagnosis not present

## 2021-03-13 DIAGNOSIS — Z23 Encounter for immunization: Secondary | ICD-10-CM | POA: Diagnosis not present

## 2021-03-13 DIAGNOSIS — L821 Other seborrheic keratosis: Secondary | ICD-10-CM | POA: Diagnosis not present

## 2021-03-13 DIAGNOSIS — R2689 Other abnormalities of gait and mobility: Secondary | ICD-10-CM | POA: Diagnosis not present

## 2021-03-13 DIAGNOSIS — L905 Scar conditions and fibrosis of skin: Secondary | ICD-10-CM | POA: Diagnosis not present

## 2021-03-13 DIAGNOSIS — M6281 Muscle weakness (generalized): Secondary | ICD-10-CM | POA: Diagnosis not present

## 2021-03-13 DIAGNOSIS — R2681 Unsteadiness on feet: Secondary | ICD-10-CM | POA: Diagnosis not present

## 2021-03-14 DIAGNOSIS — R262 Difficulty in walking, not elsewhere classified: Secondary | ICD-10-CM | POA: Diagnosis not present

## 2021-03-14 DIAGNOSIS — N3941 Urge incontinence: Secondary | ICD-10-CM | POA: Diagnosis not present

## 2021-03-14 DIAGNOSIS — M6281 Muscle weakness (generalized): Secondary | ICD-10-CM | POA: Diagnosis not present

## 2021-03-14 DIAGNOSIS — R2681 Unsteadiness on feet: Secondary | ICD-10-CM | POA: Diagnosis not present

## 2021-03-14 DIAGNOSIS — R2689 Other abnormalities of gait and mobility: Secondary | ICD-10-CM | POA: Diagnosis not present

## 2021-03-15 DIAGNOSIS — H53002 Unspecified amblyopia, left eye: Secondary | ICD-10-CM | POA: Diagnosis not present

## 2021-03-15 DIAGNOSIS — H52203 Unspecified astigmatism, bilateral: Secondary | ICD-10-CM | POA: Diagnosis not present

## 2021-03-15 DIAGNOSIS — E119 Type 2 diabetes mellitus without complications: Secondary | ICD-10-CM | POA: Diagnosis not present

## 2021-03-15 DIAGNOSIS — H2513 Age-related nuclear cataract, bilateral: Secondary | ICD-10-CM | POA: Diagnosis not present

## 2021-03-16 DIAGNOSIS — R2681 Unsteadiness on feet: Secondary | ICD-10-CM | POA: Diagnosis not present

## 2021-03-16 DIAGNOSIS — R2689 Other abnormalities of gait and mobility: Secondary | ICD-10-CM | POA: Diagnosis not present

## 2021-03-16 DIAGNOSIS — R262 Difficulty in walking, not elsewhere classified: Secondary | ICD-10-CM | POA: Diagnosis not present

## 2021-03-16 DIAGNOSIS — M6281 Muscle weakness (generalized): Secondary | ICD-10-CM | POA: Diagnosis not present

## 2021-03-16 DIAGNOSIS — N3941 Urge incontinence: Secondary | ICD-10-CM | POA: Diagnosis not present

## 2021-03-21 DIAGNOSIS — N3941 Urge incontinence: Secondary | ICD-10-CM | POA: Diagnosis not present

## 2021-03-21 DIAGNOSIS — R262 Difficulty in walking, not elsewhere classified: Secondary | ICD-10-CM | POA: Diagnosis not present

## 2021-03-21 DIAGNOSIS — R2681 Unsteadiness on feet: Secondary | ICD-10-CM | POA: Diagnosis not present

## 2021-03-21 DIAGNOSIS — R2689 Other abnormalities of gait and mobility: Secondary | ICD-10-CM | POA: Diagnosis not present

## 2021-03-21 DIAGNOSIS — M6281 Muscle weakness (generalized): Secondary | ICD-10-CM | POA: Diagnosis not present

## 2021-03-23 DIAGNOSIS — M6281 Muscle weakness (generalized): Secondary | ICD-10-CM | POA: Diagnosis not present

## 2021-03-23 DIAGNOSIS — N3941 Urge incontinence: Secondary | ICD-10-CM | POA: Diagnosis not present

## 2021-03-23 DIAGNOSIS — R2689 Other abnormalities of gait and mobility: Secondary | ICD-10-CM | POA: Diagnosis not present

## 2021-03-23 DIAGNOSIS — R2681 Unsteadiness on feet: Secondary | ICD-10-CM | POA: Diagnosis not present

## 2021-03-23 DIAGNOSIS — R262 Difficulty in walking, not elsewhere classified: Secondary | ICD-10-CM | POA: Diagnosis not present

## 2021-03-27 DIAGNOSIS — R058 Other specified cough: Secondary | ICD-10-CM | POA: Diagnosis not present

## 2021-03-27 DIAGNOSIS — Z1159 Encounter for screening for other viral diseases: Secondary | ICD-10-CM | POA: Diagnosis not present

## 2021-03-27 DIAGNOSIS — Z20828 Contact with and (suspected) exposure to other viral communicable diseases: Secondary | ICD-10-CM | POA: Diagnosis not present

## 2021-03-28 DIAGNOSIS — R262 Difficulty in walking, not elsewhere classified: Secondary | ICD-10-CM | POA: Diagnosis not present

## 2021-03-28 DIAGNOSIS — M6281 Muscle weakness (generalized): Secondary | ICD-10-CM | POA: Diagnosis not present

## 2021-03-28 DIAGNOSIS — R2689 Other abnormalities of gait and mobility: Secondary | ICD-10-CM | POA: Diagnosis not present

## 2021-03-28 DIAGNOSIS — R2681 Unsteadiness on feet: Secondary | ICD-10-CM | POA: Diagnosis not present

## 2021-03-28 DIAGNOSIS — N3941 Urge incontinence: Secondary | ICD-10-CM | POA: Diagnosis not present

## 2021-03-29 ENCOUNTER — Ambulatory Visit: Payer: Medicare Other | Admitting: Podiatry

## 2021-04-03 ENCOUNTER — Ambulatory Visit (INDEPENDENT_AMBULATORY_CARE_PROVIDER_SITE_OTHER): Payer: Medicare Other | Admitting: Podiatry

## 2021-04-03 ENCOUNTER — Encounter: Payer: Self-pay | Admitting: Podiatry

## 2021-04-03 ENCOUNTER — Other Ambulatory Visit: Payer: Self-pay

## 2021-04-03 DIAGNOSIS — B351 Tinea unguium: Secondary | ICD-10-CM

## 2021-04-03 DIAGNOSIS — E119 Type 2 diabetes mellitus without complications: Secondary | ICD-10-CM | POA: Diagnosis not present

## 2021-04-03 DIAGNOSIS — M79676 Pain in unspecified toe(s): Secondary | ICD-10-CM | POA: Diagnosis not present

## 2021-04-03 NOTE — Progress Notes (Signed)
This patient returns to my office for at risk foot care.  This patient requires this care by a professional since this patient will be at risk due to having  Diabetes.   This patient is unable to cut nails himself since the patient cannot reach his nails.These nails are painful walking and wearing shoes.  Patient is also concerned about swelling in his feet. He says there is no pain in his swollen feet.   This patient presents for at risk foot care today.  General Appearance  Alert, conversant and in no acute stress.  Vascular  Dorsalis pedis and posterior tibial  pulses are weakly  palpable  bilaterally.  Capillary return is within normal limits  bilaterally. Temperature is within normal limits  Bilaterally.  Venous stasis both legs.  Swelling feet.  Neurologic  Senn-Weinstein monofilament wire test within normal limits  bilaterally. Muscle power within normal limits bilaterally.  Nails Thick disfigured discolored nails with subungual debris  from hallux to fifth toes bilaterally. No evidence of bacterial infection or drainage bilaterally.  Orthopedic  No limitations of motion  feet .  No crepitus or effusions noted.  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  Prominent metatarsal heads  B/L.  Skin  normotropic skin with no porokeratosis noted bilaterally.  No signs of infections or ulcers noted.     Onychomycosis  Pain in right toes  Pain in left toes  Consent was obtained for treatment procedures.   Mechanical debridement of nails 1-5  bilaterally performed with a nail nipper.  Filed with dremel without incident.     Return office visit    10 weeks                  Told patient to return for periodic foot care and evaluation due to potential at risk complications.   Jmya Uliano DPM  

## 2021-04-04 DIAGNOSIS — R2689 Other abnormalities of gait and mobility: Secondary | ICD-10-CM | POA: Diagnosis not present

## 2021-04-04 DIAGNOSIS — R2681 Unsteadiness on feet: Secondary | ICD-10-CM | POA: Diagnosis not present

## 2021-04-04 DIAGNOSIS — M6281 Muscle weakness (generalized): Secondary | ICD-10-CM | POA: Diagnosis not present

## 2021-04-04 DIAGNOSIS — R262 Difficulty in walking, not elsewhere classified: Secondary | ICD-10-CM | POA: Diagnosis not present

## 2021-04-04 DIAGNOSIS — N3941 Urge incontinence: Secondary | ICD-10-CM | POA: Diagnosis not present

## 2021-04-06 DIAGNOSIS — R2689 Other abnormalities of gait and mobility: Secondary | ICD-10-CM | POA: Diagnosis not present

## 2021-04-06 DIAGNOSIS — R262 Difficulty in walking, not elsewhere classified: Secondary | ICD-10-CM | POA: Diagnosis not present

## 2021-04-06 DIAGNOSIS — N3941 Urge incontinence: Secondary | ICD-10-CM | POA: Diagnosis not present

## 2021-04-06 DIAGNOSIS — R2681 Unsteadiness on feet: Secondary | ICD-10-CM | POA: Diagnosis not present

## 2021-04-06 DIAGNOSIS — M6281 Muscle weakness (generalized): Secondary | ICD-10-CM | POA: Diagnosis not present

## 2021-04-07 DIAGNOSIS — M6281 Muscle weakness (generalized): Secondary | ICD-10-CM | POA: Diagnosis not present

## 2021-04-07 DIAGNOSIS — R2681 Unsteadiness on feet: Secondary | ICD-10-CM | POA: Diagnosis not present

## 2021-04-07 DIAGNOSIS — R262 Difficulty in walking, not elsewhere classified: Secondary | ICD-10-CM | POA: Diagnosis not present

## 2021-04-07 DIAGNOSIS — N3941 Urge incontinence: Secondary | ICD-10-CM | POA: Diagnosis not present

## 2021-04-07 DIAGNOSIS — R2689 Other abnormalities of gait and mobility: Secondary | ICD-10-CM | POA: Diagnosis not present

## 2021-04-10 DIAGNOSIS — R2681 Unsteadiness on feet: Secondary | ICD-10-CM | POA: Diagnosis not present

## 2021-04-10 DIAGNOSIS — R262 Difficulty in walking, not elsewhere classified: Secondary | ICD-10-CM | POA: Diagnosis not present

## 2021-04-10 DIAGNOSIS — N3941 Urge incontinence: Secondary | ICD-10-CM | POA: Diagnosis not present

## 2021-04-10 DIAGNOSIS — M6281 Muscle weakness (generalized): Secondary | ICD-10-CM | POA: Diagnosis not present

## 2021-04-10 DIAGNOSIS — R2689 Other abnormalities of gait and mobility: Secondary | ICD-10-CM | POA: Diagnosis not present

## 2021-04-11 DIAGNOSIS — R972 Elevated prostate specific antigen [PSA]: Secondary | ICD-10-CM | POA: Diagnosis not present

## 2021-04-12 DIAGNOSIS — R2681 Unsteadiness on feet: Secondary | ICD-10-CM | POA: Diagnosis not present

## 2021-04-12 DIAGNOSIS — R262 Difficulty in walking, not elsewhere classified: Secondary | ICD-10-CM | POA: Diagnosis not present

## 2021-04-12 DIAGNOSIS — L570 Actinic keratosis: Secondary | ICD-10-CM | POA: Diagnosis not present

## 2021-04-12 DIAGNOSIS — N3941 Urge incontinence: Secondary | ICD-10-CM | POA: Diagnosis not present

## 2021-04-12 DIAGNOSIS — M6281 Muscle weakness (generalized): Secondary | ICD-10-CM | POA: Diagnosis not present

## 2021-04-12 DIAGNOSIS — R2689 Other abnormalities of gait and mobility: Secondary | ICD-10-CM | POA: Diagnosis not present

## 2021-04-13 DIAGNOSIS — C61 Malignant neoplasm of prostate: Secondary | ICD-10-CM | POA: Diagnosis not present

## 2021-04-13 DIAGNOSIS — N200 Calculus of kidney: Secondary | ICD-10-CM | POA: Diagnosis not present

## 2021-04-14 DIAGNOSIS — R262 Difficulty in walking, not elsewhere classified: Secondary | ICD-10-CM | POA: Diagnosis not present

## 2021-04-14 DIAGNOSIS — M6281 Muscle weakness (generalized): Secondary | ICD-10-CM | POA: Diagnosis not present

## 2021-04-14 DIAGNOSIS — R2689 Other abnormalities of gait and mobility: Secondary | ICD-10-CM | POA: Diagnosis not present

## 2021-04-14 DIAGNOSIS — R2681 Unsteadiness on feet: Secondary | ICD-10-CM | POA: Diagnosis not present

## 2021-04-14 DIAGNOSIS — N3941 Urge incontinence: Secondary | ICD-10-CM | POA: Diagnosis not present

## 2021-04-17 DIAGNOSIS — N3941 Urge incontinence: Secondary | ICD-10-CM | POA: Diagnosis not present

## 2021-04-17 DIAGNOSIS — R262 Difficulty in walking, not elsewhere classified: Secondary | ICD-10-CM | POA: Diagnosis not present

## 2021-04-17 DIAGNOSIS — R2689 Other abnormalities of gait and mobility: Secondary | ICD-10-CM | POA: Diagnosis not present

## 2021-04-17 DIAGNOSIS — M6281 Muscle weakness (generalized): Secondary | ICD-10-CM | POA: Diagnosis not present

## 2021-04-17 DIAGNOSIS — R2681 Unsteadiness on feet: Secondary | ICD-10-CM | POA: Diagnosis not present

## 2021-04-18 DIAGNOSIS — H2512 Age-related nuclear cataract, left eye: Secondary | ICD-10-CM | POA: Diagnosis not present

## 2021-05-01 DIAGNOSIS — Z1159 Encounter for screening for other viral diseases: Secondary | ICD-10-CM | POA: Diagnosis not present

## 2021-05-01 DIAGNOSIS — Z20828 Contact with and (suspected) exposure to other viral communicable diseases: Secondary | ICD-10-CM | POA: Diagnosis not present

## 2021-06-05 DIAGNOSIS — Z1159 Encounter for screening for other viral diseases: Secondary | ICD-10-CM | POA: Diagnosis not present

## 2021-06-05 DIAGNOSIS — Z20828 Contact with and (suspected) exposure to other viral communicable diseases: Secondary | ICD-10-CM | POA: Diagnosis not present

## 2021-06-07 ENCOUNTER — Ambulatory Visit: Payer: Medicare Other | Admitting: Podiatry

## 2021-06-09 ENCOUNTER — Ambulatory Visit (INDEPENDENT_AMBULATORY_CARE_PROVIDER_SITE_OTHER): Payer: Medicare Other | Admitting: Podiatry

## 2021-06-09 ENCOUNTER — Other Ambulatory Visit: Payer: Self-pay

## 2021-06-09 ENCOUNTER — Encounter: Payer: Self-pay | Admitting: Podiatry

## 2021-06-09 DIAGNOSIS — B351 Tinea unguium: Secondary | ICD-10-CM

## 2021-06-09 DIAGNOSIS — M79676 Pain in unspecified toe(s): Secondary | ICD-10-CM

## 2021-06-09 DIAGNOSIS — E119 Type 2 diabetes mellitus without complications: Secondary | ICD-10-CM | POA: Diagnosis not present

## 2021-06-09 NOTE — Progress Notes (Signed)
This patient returns to my office for at risk foot care.  This patient requires this care by a professional since this patient will be at risk due to having  Diabetes.   This patient is unable to cut nails himself since the patient cannot reach his nails.These nails are painful walking and wearing shoes.  Patient is also concerned about swelling in his feet. He says there is no pain in his swollen feet.   This patient presents for at risk foot care today.  General Appearance  Alert, conversant and in no acute stress.  Vascular  Dorsalis pedis and posterior tibial  pulses are weakly  palpable  bilaterally.  Capillary return is within normal limits  bilaterally. Temperature is within normal limits  Bilaterally.  Venous stasis both legs.  Swelling feet.  Neurologic  Senn-Weinstein monofilament wire test within normal limits  bilaterally. Muscle power within normal limits bilaterally.  Nails Thick disfigured discolored nails with subungual debris  from hallux to fifth toes bilaterally. No evidence of bacterial infection or drainage bilaterally.  Orthopedic  No limitations of motion  feet .  No crepitus or effusions noted.  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  Prominent metatarsal heads  B/L.  Skin  normotropic skin with no porokeratosis noted bilaterally.  No signs of infections or ulcers noted.     Onychomycosis  Pain in right toes  Pain in left toes  Consent was obtained for treatment procedures.   Mechanical debridement of nails 1-5  bilaterally performed with a nail nipper.  Filed with dremel without incident.     Return office visit    10 weeks                  Told patient to return for periodic foot care and evaluation due to potential at risk complications.   Ezrah Dembeck DPM  

## 2021-06-12 DIAGNOSIS — Z20828 Contact with and (suspected) exposure to other viral communicable diseases: Secondary | ICD-10-CM | POA: Diagnosis not present

## 2021-06-12 DIAGNOSIS — Z1159 Encounter for screening for other viral diseases: Secondary | ICD-10-CM | POA: Diagnosis not present

## 2021-06-13 DIAGNOSIS — H25811 Combined forms of age-related cataract, right eye: Secondary | ICD-10-CM | POA: Diagnosis not present

## 2021-06-13 DIAGNOSIS — H2511 Age-related nuclear cataract, right eye: Secondary | ICD-10-CM | POA: Diagnosis not present

## 2021-06-28 DIAGNOSIS — I739 Peripheral vascular disease, unspecified: Secondary | ICD-10-CM | POA: Diagnosis not present

## 2021-06-28 DIAGNOSIS — Z1389 Encounter for screening for other disorder: Secondary | ICD-10-CM | POA: Diagnosis not present

## 2021-06-28 DIAGNOSIS — E1140 Type 2 diabetes mellitus with diabetic neuropathy, unspecified: Secondary | ICD-10-CM | POA: Diagnosis not present

## 2021-06-28 DIAGNOSIS — E1151 Type 2 diabetes mellitus with diabetic peripheral angiopathy without gangrene: Secondary | ICD-10-CM | POA: Diagnosis not present

## 2021-06-28 DIAGNOSIS — Z7984 Long term (current) use of oral hypoglycemic drugs: Secondary | ICD-10-CM | POA: Diagnosis not present

## 2021-06-28 DIAGNOSIS — I10 Essential (primary) hypertension: Secondary | ICD-10-CM | POA: Diagnosis not present

## 2021-06-28 DIAGNOSIS — E785 Hyperlipidemia, unspecified: Secondary | ICD-10-CM | POA: Diagnosis not present

## 2021-06-28 DIAGNOSIS — F325 Major depressive disorder, single episode, in full remission: Secondary | ICD-10-CM | POA: Diagnosis not present

## 2021-06-28 DIAGNOSIS — Z0000 Encounter for general adult medical examination without abnormal findings: Secondary | ICD-10-CM | POA: Diagnosis not present

## 2021-06-28 DIAGNOSIS — C61 Malignant neoplasm of prostate: Secondary | ICD-10-CM | POA: Diagnosis not present

## 2021-07-03 ENCOUNTER — Other Ambulatory Visit: Payer: Self-pay

## 2021-07-03 ENCOUNTER — Ambulatory Visit (INDEPENDENT_AMBULATORY_CARE_PROVIDER_SITE_OTHER): Payer: Medicare Other | Admitting: Otolaryngology

## 2021-07-03 DIAGNOSIS — H903 Sensorineural hearing loss, bilateral: Secondary | ICD-10-CM | POA: Diagnosis not present

## 2021-07-03 DIAGNOSIS — H6123 Impacted cerumen, bilateral: Secondary | ICD-10-CM

## 2021-07-03 NOTE — Progress Notes (Signed)
HPI: Jay Bailey is a 80 y.o. male who presents for evaluation of wax buildup in his ears.  He was a patient of Dr. Elwyn Reach and got his hearing aids from [facility].  He has not had his ears cleaned in a couple years..  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   Cancer (HCC)    Congenital deformity of ankle joint    Depression    a long time ago    Diabetes mellitus without complication (HCC)    type 2   Hyperlipidemia    Hypertension    Inguinal hernia    Prostate cancer ([place])    Skin cancer (melanoma) ([place])    historical    Wears hearing aid    bialteral    Past Surgical History:  Procedure Laterality Date   INGUINAL HERNIA REPAIR     right and left hernias  miltiple times with mesh placement    INGUINAL HERNIA REPAIR Left 12/26/2018   Procedure: OPEN LEFT INGUINAL HERNIA WITH MESH;  Surgeon: Johnathan Hausen, MD;  Location: [facility];  Service: General;  Laterality: Left;   nose myeloma     PROSTATECTOMY  2005   TOTAL HIP ARTHROPLASTY  "long time ago "   bilateral ; [place]    Social History   Socioeconomic History   Marital status: Single    Spouse name: Not on file   Number of children: Not on file   Years of education: Not on file   Highest education level: Not on file  Occupational History   Not on file  Tobacco Use   Smoking status: Never   Smokeless tobacco: Never  Vaping Use   Vaping Use: Never used  Substance and Sexual Activity   Alcohol use: No   Drug use: No   Sexual activity: Not Currently  Other Topics Concern   Not on file  Social History Narrative   Not on file   Social Determinants of Health   Financial Resource Strain: Not on file  Food Insecurity: Not on file  Transportation Needs: Not on file  Physical Activity: Not on file  Stress: Not on file  Social Connections: Not on file   Family History  Problem Relation Age  of Onset   Colon cancer Mother    Kidney cancer Mother    Heart disease Father    Lung cancer Brother    Pancreatic cancer Neg Hx    Prostate cancer Neg Hx    Breast cancer Neg Hx    Allergies  Allergen Reactions   Lisinopril     Other reaction(s): cough   Prior to Admission medications   Medication Sig Start Date End Date Taking? Authorizing Provider  ACCU-CHEK GUIDE test strip  10/15/20   [provider]  amLODipine (NORVASC) 2.5 MG tablet Take 2.5 mg by mouth daily.    [provider]  amoxicillin-clavulanate (AUGMENTIN) 875-125 MG tablet Take 1 tablet by mouth every 12 (twelve) hours. 12/02/20   Georgette Shell, MD  ascorbic acid (VITAMIN C) 1000 MG tablet Take 1,000 mg by mouth daily.    [provider]  atorvastatin (LIPITOR) 80 MG tablet Take 80 mg by mouth daily.    [provider]  buPROPion (WELLBUTRIN XL) 150 MG 24 hr tablet 1 tablet every morning 01/18/21   [provider]  folic acid (FOLVITE) A999333 MCG tablet Take 400 mcg by mouth daily.     [provider]  irbesartan-hydrochlorothiazide (AVALIDE) 150-12.5 MG tablet Take 1 tablet by mouth daily. 08/31/20   [provider]  loperamide (IMODIUM) 2 MG capsule Take 2 mg by mouth daily as needed for diarrhea or loose stools.    [provider]  metFORMIN (GLUCOPHAGE-XR) 500 MG 24 hr tablet Take 500 mg by mouth daily with breakfast. 11/27/18   [provider]  Multiple Vitamin (MULTIVITAMIN) tablet Take 1 tablet by mouth daily.    [provider]  ondansetron (ZOFRAN) 4 MG tablet Take 1 tablet (4 mg total) by mouth every 6 (six) hours as needed for nausea. 12/02/20   Georgette Shell, MD  oxybutynin (DITROPAN-XL) 5 MG 24 hr tablet Take 1 tablet by mouth daily. 10/31/20   [provider]  pantoprazole (PROTONIX) 40 MG tablet Take 1 tablet (40 mg total) by mouth daily. 12/03/20   Georgette Shell, MD  prochlorperazine  (COMPAZINE) 10 MG tablet Take 1 tablet (10 mg total) by mouth every 6 (six) hours as needed for nausea or vomiting. 11/16/20   Tyler Pita, MD  vitamin E 400 UNIT capsule Take 400 Units by mouth daily.    [provider]     Positive ROS: Otherwise negative  All other systems have been reviewed and were otherwise negative with the exception of those mentioned in the HPI and as above.  Physical Exam: Constitutional: Alert, well-appearing, no acute distress Ears: External ears without lesions or tenderness. Ear canals with a large amount of wax buildup in both ear canals that was cleaned with suction curettes and forceps.  After cleaning the ear canals the TMs were clear bilaterally.. Nasal: External nose without lesions. Clear nasal passages Oral: Oropharynx clear. Neck: No palpable adenopathy or masses Respiratory: Breathing comfortably  Skin: No facial/neck lesions or rash noted.  Cerumen impaction removal  Date/Time: 07/03/2021 3:41 PM Performed by: Rozetta Nunnery, MD Authorized by: Rozetta Nunnery, MD   Consent:    Consent obtained:  Verbal   Consent given by:  Patient   Risks discussed:  Pain and bleeding Procedure details:    Location:  L ear and R ear   Procedure type: curette, suction and forceps   Post-procedure details:    Inspection:  TM intact and canal normal   Hearing quality:  Improved   Procedure completion:  Tolerated well, no immediate complications Comments:     Had a mod amount of wax buildup in both ear canals causing partial obstruction.  After removing this the TMs were clear bilaterally.  Assessment: Cerumen buildup in both ear canals in patient who wears hearing aids.  Plan: This was cleaned in the office.  Recommend having his ears cleaned on annual basis.  Radene Journey, MD

## 2021-07-04 DIAGNOSIS — L304 Erythema intertrigo: Secondary | ICD-10-CM | POA: Diagnosis not present

## 2021-07-04 DIAGNOSIS — L821 Other seborrheic keratosis: Secondary | ICD-10-CM | POA: Diagnosis not present

## 2021-07-04 DIAGNOSIS — D225 Melanocytic nevi of trunk: Secondary | ICD-10-CM | POA: Diagnosis not present

## 2021-07-04 DIAGNOSIS — L814 Other melanin hyperpigmentation: Secondary | ICD-10-CM | POA: Diagnosis not present

## 2021-07-04 DIAGNOSIS — Z85820 Personal history of malignant melanoma of skin: Secondary | ICD-10-CM | POA: Diagnosis not present

## 2021-07-04 DIAGNOSIS — L578 Other skin changes due to chronic exposure to nonionizing radiation: Secondary | ICD-10-CM | POA: Diagnosis not present

## 2021-07-12 DIAGNOSIS — Z20828 Contact with and (suspected) exposure to other viral communicable diseases: Secondary | ICD-10-CM | POA: Diagnosis not present

## 2021-07-17 DIAGNOSIS — C61 Malignant neoplasm of prostate: Secondary | ICD-10-CM | POA: Diagnosis not present

## 2021-07-24 DIAGNOSIS — Z8616 Personal history of COVID-19: Secondary | ICD-10-CM | POA: Diagnosis not present

## 2021-08-07 DIAGNOSIS — Z8616 Personal history of COVID-19: Secondary | ICD-10-CM | POA: Diagnosis not present

## 2021-08-16 ENCOUNTER — Ambulatory Visit: Payer: Medicare Other | Admitting: Podiatry

## 2021-08-16 DIAGNOSIS — Z23 Encounter for immunization: Secondary | ICD-10-CM | POA: Diagnosis not present

## 2021-08-23 ENCOUNTER — Other Ambulatory Visit: Payer: Self-pay

## 2021-08-23 ENCOUNTER — Encounter: Payer: Self-pay | Admitting: Podiatry

## 2021-08-23 ENCOUNTER — Ambulatory Visit (INDEPENDENT_AMBULATORY_CARE_PROVIDER_SITE_OTHER): Payer: Medicare Other | Admitting: Podiatry

## 2021-08-23 DIAGNOSIS — E119 Type 2 diabetes mellitus without complications: Secondary | ICD-10-CM | POA: Diagnosis not present

## 2021-08-23 DIAGNOSIS — M79676 Pain in unspecified toe(s): Secondary | ICD-10-CM | POA: Diagnosis not present

## 2021-08-23 DIAGNOSIS — B351 Tinea unguium: Secondary | ICD-10-CM | POA: Diagnosis not present

## 2021-08-23 NOTE — Progress Notes (Signed)
This patient returns to my office for at risk foot care.  This patient requires this care by a professional since this patient will be at risk due to having  Diabetes.   This patient is unable to cut nails himself since the patient cannot reach his nails.These nails are painful walking and wearing shoes.  Patient is also concerned about swelling in his feet. He says there is no pain in his swollen feet.   This patient presents for at risk foot care today.  General Appearance  Alert, conversant and in no acute stress.  Vascular  Dorsalis pedis and posterior tibial  pulses are weakly  palpable  bilaterally.  Capillary return is within normal limits  bilaterally. Temperature is within normal limits  Bilaterally.  Venous stasis both legs.  Swelling feet.  Neurologic  Senn-Weinstein monofilament wire test within normal limits  bilaterally. Muscle power within normal limits bilaterally.  Nails Thick disfigured discolored nails with subungual debris  from hallux to fifth toes bilaterally. No evidence of bacterial infection or drainage bilaterally.  Orthopedic  No limitations of motion  feet .  No crepitus or effusions noted.  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  Prominent metatarsal heads  B/L.  Skin  normotropic skin with no porokeratosis noted bilaterally.  No signs of infections or ulcers noted.     Onychomycosis  Pain in right toes  Pain in left toes  Consent was obtained for treatment procedures.   Mechanical debridement of nails 1-5  bilaterally performed with a nail nipper.  Filed with dremel without incident.     Return office visit    10 weeks                  Told patient to return for periodic foot care and evaluation due to potential at risk complications.   Ramez Arrona DPM  

## 2021-09-04 DIAGNOSIS — Z20828 Contact with and (suspected) exposure to other viral communicable diseases: Secondary | ICD-10-CM | POA: Diagnosis not present

## 2021-09-14 DIAGNOSIS — Z23 Encounter for immunization: Secondary | ICD-10-CM | POA: Diagnosis not present

## 2021-09-18 DIAGNOSIS — Z20828 Contact with and (suspected) exposure to other viral communicable diseases: Secondary | ICD-10-CM | POA: Diagnosis not present

## 2021-09-18 DIAGNOSIS — H04122 Dry eye syndrome of left lacrimal gland: Secondary | ICD-10-CM | POA: Diagnosis not present

## 2021-10-02 DIAGNOSIS — Z8616 Personal history of COVID-19: Secondary | ICD-10-CM | POA: Diagnosis not present

## 2021-10-30 DIAGNOSIS — Z1159 Encounter for screening for other viral diseases: Secondary | ICD-10-CM | POA: Diagnosis not present

## 2021-10-30 DIAGNOSIS — Z20828 Contact with and (suspected) exposure to other viral communicable diseases: Secondary | ICD-10-CM | POA: Diagnosis not present

## 2021-11-03 ENCOUNTER — Encounter: Payer: Self-pay | Admitting: Podiatry

## 2021-11-03 ENCOUNTER — Ambulatory Visit (INDEPENDENT_AMBULATORY_CARE_PROVIDER_SITE_OTHER): Payer: Medicare Other | Admitting: Podiatry

## 2021-11-03 ENCOUNTER — Other Ambulatory Visit: Payer: Self-pay

## 2021-11-03 DIAGNOSIS — B351 Tinea unguium: Secondary | ICD-10-CM

## 2021-11-03 DIAGNOSIS — E119 Type 2 diabetes mellitus without complications: Secondary | ICD-10-CM | POA: Diagnosis not present

## 2021-11-03 DIAGNOSIS — M79676 Pain in unspecified toe(s): Secondary | ICD-10-CM | POA: Diagnosis not present

## 2021-11-03 NOTE — Progress Notes (Signed)
This patient returns to my office for at risk foot care.  This patient requires this care by a professional since this patient will be at risk due to having  Diabetes.   This patient is unable to cut nails himself since the patient cannot reach his nails.These nails are painful walking and wearing shoes.  Patient is also concerned about swelling in his feet. He says there is no pain in his swollen feet.   This patient presents for at risk foot care today.  General Appearance  Alert, conversant and in no acute stress.  Vascular  Dorsalis pedis and posterior tibial  pulses are weakly  palpable  bilaterally.  Capillary return is within normal limits  bilaterally. Temperature is within normal limits  Bilaterally.  Venous stasis both legs.  Swelling feet.  Neurologic  Senn-Weinstein monofilament wire test within normal limits  bilaterally. Muscle power within normal limits bilaterally.  Nails Thick disfigured discolored nails with subungual debris  from hallux to fifth toes bilaterally. No evidence of bacterial infection or drainage bilaterally.  Orthopedic  No limitations of motion  feet .  No crepitus or effusions noted.  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  Prominent metatarsal heads  B/L.  Skin  normotropic skin with no porokeratosis noted bilaterally.  No signs of infections or ulcers noted.     Onychomycosis  Pain in right toes  Pain in left toes  Consent was obtained for treatment procedures.   Mechanical debridement of nails 1-5  bilaterally performed with a nail nipper.  Filed with dremel without incident.     Return office visit    10 weeks                  Told patient to return for periodic foot care and evaluation due to potential at risk complications.   Margo Lama DPM  

## 2021-11-15 DIAGNOSIS — Z1159 Encounter for screening for other viral diseases: Secondary | ICD-10-CM | POA: Diagnosis not present

## 2021-11-15 DIAGNOSIS — Z20828 Contact with and (suspected) exposure to other viral communicable diseases: Secondary | ICD-10-CM | POA: Diagnosis not present

## 2021-11-20 DIAGNOSIS — Z1159 Encounter for screening for other viral diseases: Secondary | ICD-10-CM | POA: Diagnosis not present

## 2021-11-20 DIAGNOSIS — Z20828 Contact with and (suspected) exposure to other viral communicable diseases: Secondary | ICD-10-CM | POA: Diagnosis not present

## 2021-11-21 DIAGNOSIS — U071 COVID-19: Secondary | ICD-10-CM | POA: Diagnosis not present

## 2021-12-26 DIAGNOSIS — R972 Elevated prostate specific antigen [PSA]: Secondary | ICD-10-CM | POA: Diagnosis not present

## 2021-12-29 DIAGNOSIS — E785 Hyperlipidemia, unspecified: Secondary | ICD-10-CM | POA: Diagnosis not present

## 2021-12-29 DIAGNOSIS — Z7984 Long term (current) use of oral hypoglycemic drugs: Secondary | ICD-10-CM | POA: Diagnosis not present

## 2021-12-29 DIAGNOSIS — I739 Peripheral vascular disease, unspecified: Secondary | ICD-10-CM | POA: Diagnosis not present

## 2021-12-29 DIAGNOSIS — I10 Essential (primary) hypertension: Secondary | ICD-10-CM | POA: Diagnosis not present

## 2021-12-29 DIAGNOSIS — R609 Edema, unspecified: Secondary | ICD-10-CM | POA: Diagnosis not present

## 2021-12-29 DIAGNOSIS — F325 Major depressive disorder, single episode, in full remission: Secondary | ICD-10-CM | POA: Diagnosis not present

## 2021-12-29 DIAGNOSIS — E1140 Type 2 diabetes mellitus with diabetic neuropathy, unspecified: Secondary | ICD-10-CM | POA: Diagnosis not present

## 2022-01-10 ENCOUNTER — Ambulatory Visit: Payer: Medicare Other | Admitting: Podiatry

## 2022-01-10 DIAGNOSIS — Z23 Encounter for immunization: Secondary | ICD-10-CM | POA: Diagnosis not present

## 2022-01-10 DIAGNOSIS — L719 Rosacea, unspecified: Secondary | ICD-10-CM | POA: Diagnosis not present

## 2022-01-10 DIAGNOSIS — L570 Actinic keratosis: Secondary | ICD-10-CM | POA: Diagnosis not present

## 2022-01-10 DIAGNOSIS — L821 Other seborrheic keratosis: Secondary | ICD-10-CM | POA: Diagnosis not present

## 2022-01-10 DIAGNOSIS — D2272 Melanocytic nevi of left lower limb, including hip: Secondary | ICD-10-CM | POA: Diagnosis not present

## 2022-01-10 DIAGNOSIS — D225 Melanocytic nevi of trunk: Secondary | ICD-10-CM | POA: Diagnosis not present

## 2022-01-10 DIAGNOSIS — Z85820 Personal history of malignant melanoma of skin: Secondary | ICD-10-CM | POA: Diagnosis not present

## 2022-01-10 DIAGNOSIS — R609 Edema, unspecified: Secondary | ICD-10-CM | POA: Diagnosis not present

## 2022-01-10 DIAGNOSIS — L578 Other skin changes due to chronic exposure to nonionizing radiation: Secondary | ICD-10-CM | POA: Diagnosis not present

## 2022-01-11 DIAGNOSIS — N200 Calculus of kidney: Secondary | ICD-10-CM | POA: Diagnosis not present

## 2022-01-11 DIAGNOSIS — C61 Malignant neoplasm of prostate: Secondary | ICD-10-CM | POA: Diagnosis not present

## 2022-01-17 ENCOUNTER — Encounter: Payer: Self-pay | Admitting: Podiatry

## 2022-01-17 ENCOUNTER — Ambulatory Visit (INDEPENDENT_AMBULATORY_CARE_PROVIDER_SITE_OTHER): Payer: Medicare Other | Admitting: Podiatry

## 2022-01-17 ENCOUNTER — Other Ambulatory Visit: Payer: Self-pay

## 2022-01-17 DIAGNOSIS — B351 Tinea unguium: Secondary | ICD-10-CM

## 2022-01-17 DIAGNOSIS — E119 Type 2 diabetes mellitus without complications: Secondary | ICD-10-CM

## 2022-01-17 DIAGNOSIS — M79676 Pain in unspecified toe(s): Secondary | ICD-10-CM

## 2022-01-17 NOTE — Progress Notes (Signed)
This patient returns to my office for at risk foot care.  This patient requires this care by a professional since this patient will be at risk due to having  Diabetes.   This patient is unable to cut nails himself since the patient cannot reach his nails.These nails are painful walking and wearing shoes.  Patient is also concerned about swelling in his feet. He says there is no pain in his swollen feet.   This patient presents for at risk foot care today.  General Appearance  Alert, conversant and in no acute stress.  Vascular  Dorsalis pedis and posterior tibial  pulses are weakly  palpable  bilaterally.  Capillary return is within normal limits  bilaterally. Temperature is within normal limits  Bilaterally.  Venous stasis both legs.  Swelling feet.  Neurologic  Senn-Weinstein monofilament wire test within normal limits  bilaterally. Muscle power within normal limits bilaterally.  Nails Thick disfigured discolored nails with subungual debris  from hallux to fifth toes bilaterally. No evidence of bacterial infection or drainage bilaterally.  Orthopedic  No limitations of motion  feet .  No crepitus or effusions noted.  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  Prominent metatarsal heads  B/L.  Skin  normotropic skin with no porokeratosis noted bilaterally.  No signs of infections or ulcers noted.     Onychomycosis  Pain in right toes  Pain in left toes  Consent was obtained for treatment procedures.   Mechanical debridement of nails 1-5  bilaterally performed with a nail nipper.  Filed with dremel without incident.     Return office visit    10 weeks                  Told patient to return for periodic foot care and evaluation due to potential at risk complications.   Henya Aguallo DPM  

## 2022-01-31 IMAGING — CT CT ABD-PELV W/ CM
3 of 5 series · 16 of 46 positions shown, 18 images · IV contrast (OMNIPAQUE 300)
Comparison: 03/02/2020

CLINICAL DATA: Diarrhea.  Fatigue.  History of prostate cancer.

EXAM:
CT ABDOMEN AND PELVIS WITH CONTRAST
TECHNIQUE: Multidetector CT imaging of the abdomen and pelvis was performed
using the standard protocol following bolus administration of
intravenous contrast.
CONTRAST:  100mL OMNIPAQUE IOHEXOL 300 MG/ML  SOLN

[Series 2: axial st · axial · 0.90mm/px · z∈[-575,-175]mm · 11 of 98 slices shown, 13 images]
[im 9/98  soft-tissue]
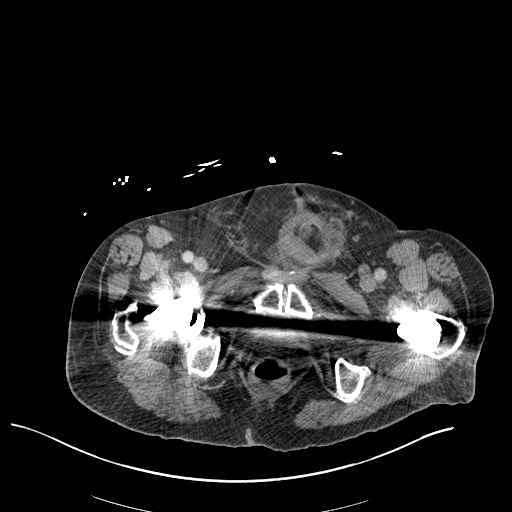
[im 9/98  bone]
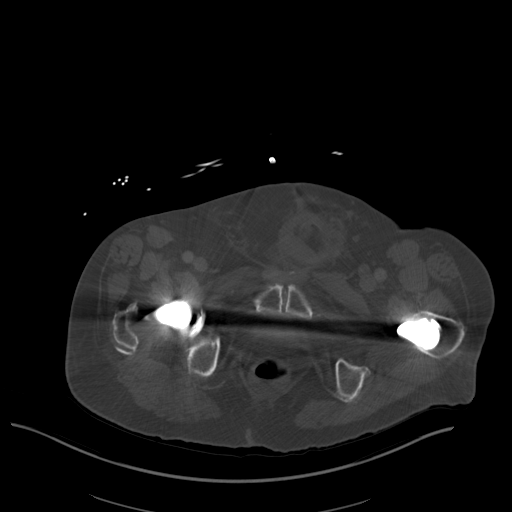
[im 17/98  soft-tissue]
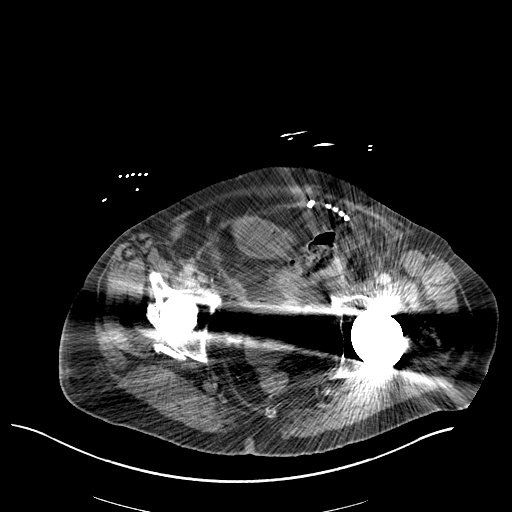
[im 25/98  soft-tissue]
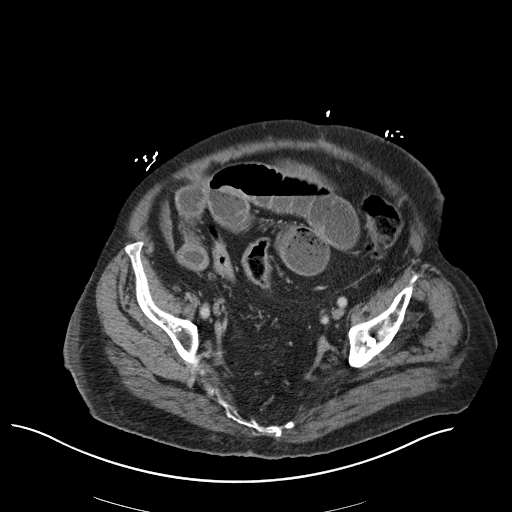
[im 33/98  soft-tissue]
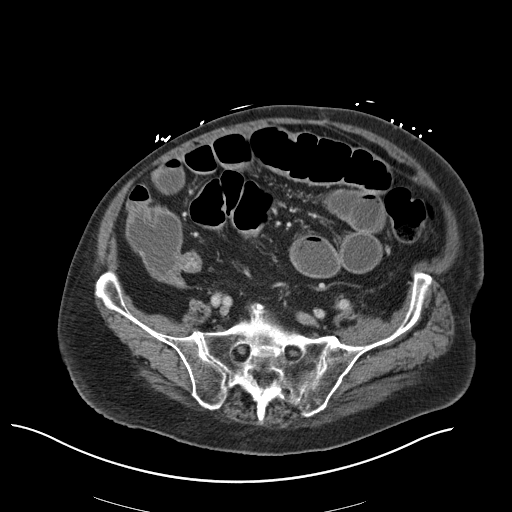
[im 41/98  soft-tissue]
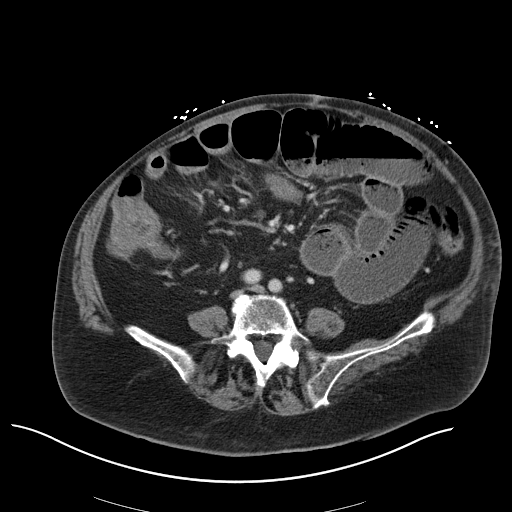
[im 49/98  soft-tissue]
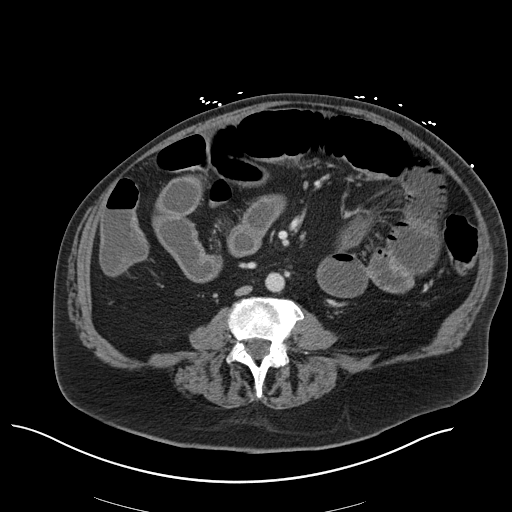
[im 57/98  soft-tissue]
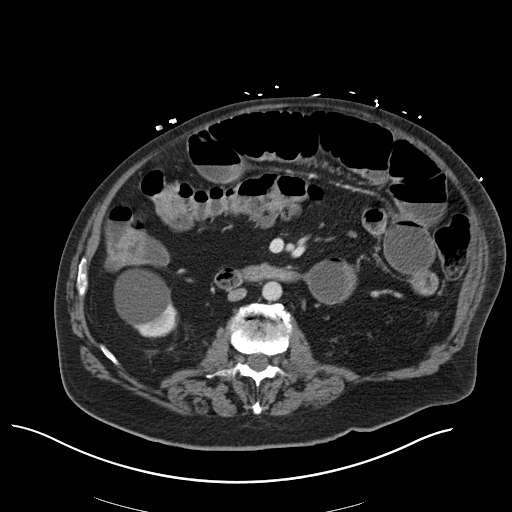
[im 65/98  soft-tissue]
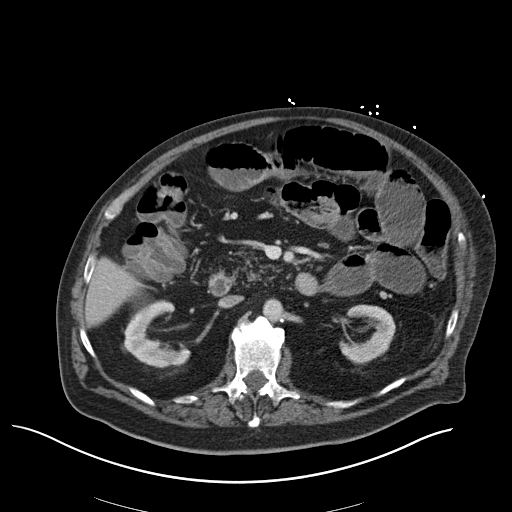
[im 73/98  soft-tissue]
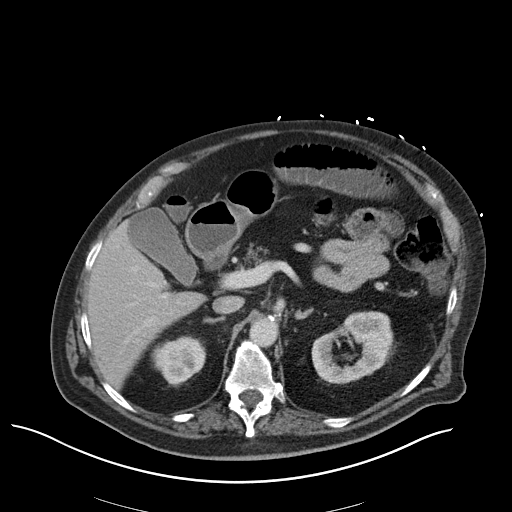
[im 73/98  bone]
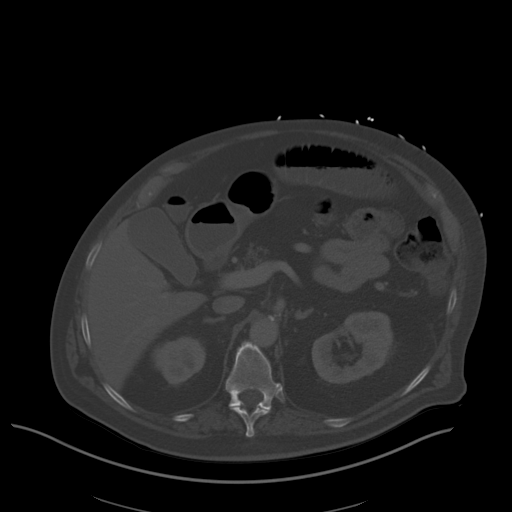
[im 81/98  soft-tissue]
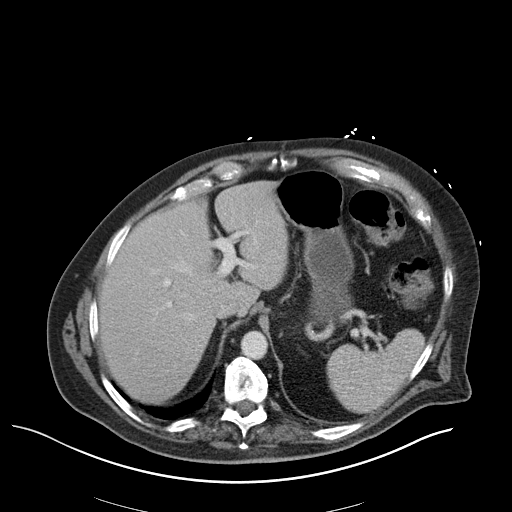
[im 89/98  soft-tissue]
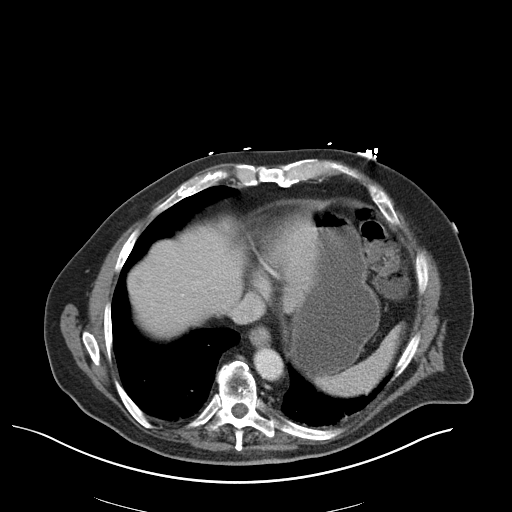

[Series 4: lung bases · axial · 0.90mm/px · z∈[-320,-288]mm · 2 of 103 slices shown]
[im 8/103  bone]
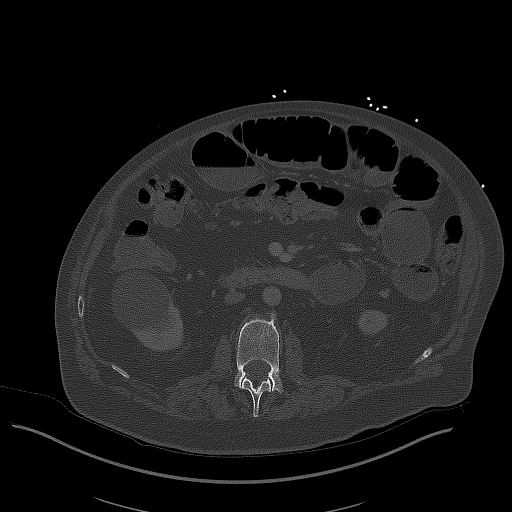
[im 24/103  bone]
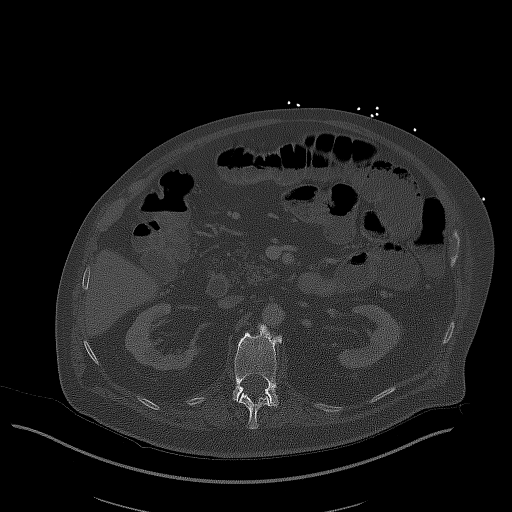

[Series 5: coronal st · coronal · 0.97mm/px · 3 of 167 slices shown]
[im 56/167  soft-tissue]
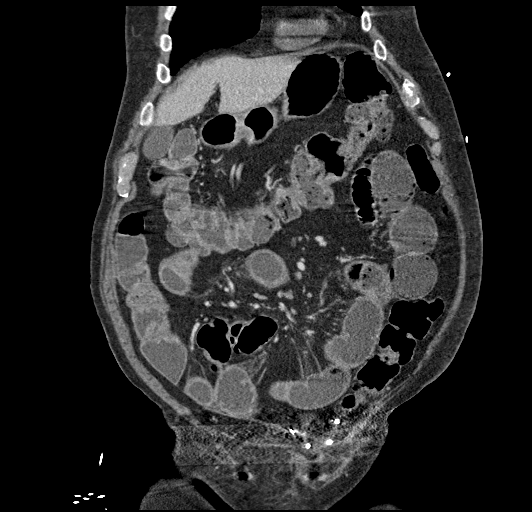
[im 74/167  soft-tissue]
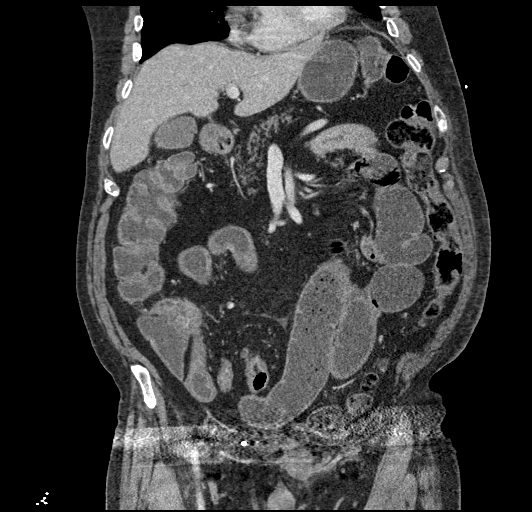
[im 93/167  soft-tissue]
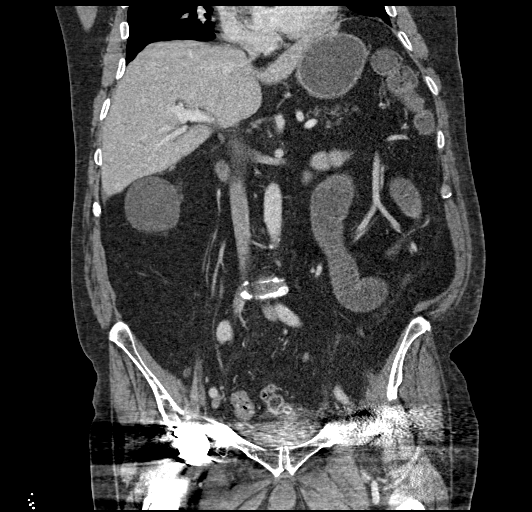

[16 of 46 positions shown; findings below may reference images not displayed]

FINDINGS: Lower chest: Lung bases are clear.

Hepatobiliary: Liver, gallbladder and biliary tree are normal.

Pancreas: Mild fatty atrophy of the pancreas.

Spleen: Normal.

Adrenals/Urinary Tract: Adrenal glands are normal. Kidneys are
normal in size without hydronephrosis or nephrolithiasis. Several
bilateral renal cysts present with the largest measuring 5.1 cm over
the lower pole right kidney. Visualized ureters are normal as the
distal ureters are obscured due to streak artifact from bilateral
hip prostheses. Bladder is mostly obscured by the streak artifact
from the hip prostheses.

Stomach/Bowel: Stomach is normal. There multiple air and
fluid-filled dilated small bowel loops measuring up to 4.6 cm in
diameter. There appears to be gradual transition to normal caliber
ileum as the ileum has intermittent areas of mild wall thickening to
the level of the ileocecal valve. No free peritoneal air or
pneumatosis. Appendix is normal. Air and stool present throughout
the colon. There is mild colonic diverticulosis.

Vascular/Lymphatic: Minimal calcified plaque over the abdominal
aorta which is normal in caliber. No adenopathy.

Reproductive: Region of the prostate obscured by streak artifact
from hip prostheses.

Other: No significant free fluid. Multiple surgical clips over the
anterior lower abdomen/pelvic wall likely due to previous hernia
repair. Residual left inguinal hernia unchanged containing small
amount of fluid and peritoneal fat. Surgical clips over the left
inguinal region.

Musculoskeletal: Degenerative changes of the spine. Bilateral hip
prostheses intact.
IMPRESSION: 1. Multiple air and fluid-filled dilated small bowel loops measuring
up to 4.6 cm in diameter with gradual transition to normal caliber
ileum as the ileum has intermittent areas of mild wall thickening to
the level of the ileocecal valve. Findings are likely due to ileus
possibly secondary regional enteritis of infectious or inflammatory
nature. Distal partial small bowel obstruction is possible. No free
peritoneal air.
2. Bilateral renal cysts with the largest measuring 5.1 cm over the
lower pole right kidney.
3. Mild colonic diverticulosis.
4. Aortic atherosclerosis.
5. Stable residual left inguinal hernia containing small amount of
fluid and peritoneal fat.

Aortic Atherosclerosis ([id number]-VTQ.Q).

## 2022-03-21 ENCOUNTER — Ambulatory Visit: Payer: Medicare Other | Admitting: Podiatry

## 2022-03-22 DIAGNOSIS — H04123 Dry eye syndrome of bilateral lacrimal glands: Secondary | ICD-10-CM | POA: Diagnosis not present

## 2022-03-22 DIAGNOSIS — H0100A Unspecified blepharitis right eye, upper and lower eyelids: Secondary | ICD-10-CM | POA: Diagnosis not present

## 2022-03-22 DIAGNOSIS — H10413 Chronic giant papillary conjunctivitis, bilateral: Secondary | ICD-10-CM | POA: Diagnosis not present

## 2022-03-22 DIAGNOSIS — H0100B Unspecified blepharitis left eye, upper and lower eyelids: Secondary | ICD-10-CM | POA: Diagnosis not present

## 2022-03-23 ENCOUNTER — Encounter: Payer: Self-pay | Admitting: Podiatry

## 2022-03-23 ENCOUNTER — Ambulatory Visit (INDEPENDENT_AMBULATORY_CARE_PROVIDER_SITE_OTHER): Payer: Medicare Other | Admitting: Podiatry

## 2022-03-23 DIAGNOSIS — B351 Tinea unguium: Secondary | ICD-10-CM | POA: Diagnosis not present

## 2022-03-23 DIAGNOSIS — E119 Type 2 diabetes mellitus without complications: Secondary | ICD-10-CM

## 2022-03-23 DIAGNOSIS — M79676 Pain in unspecified toe(s): Secondary | ICD-10-CM | POA: Diagnosis not present

## 2022-03-23 NOTE — Progress Notes (Signed)
This patient returns to my office for at risk foot care.  This patient requires this care by a professional since this patient will be at risk due to having  Diabetes.   This patient is unable to cut nails himself since the patient cannot reach his nails.These nails are painful walking and wearing shoes.  Patient is also concerned about swelling in his feet. He says there is no pain in his swollen feet.   This patient presents for at risk foot care today.  General Appearance  Alert, conversant and in no acute stress.  Vascular  Dorsalis pedis and posterior tibial  pulses are weakly  palpable  bilaterally.  Capillary return is within normal limits  bilaterally. Temperature is within normal limits  Bilaterally.  Venous stasis both legs.  Swelling feet.  Neurologic  Senn-Weinstein monofilament wire test within normal limits  bilaterally. Muscle power within normal limits bilaterally.  Nails Thick disfigured discolored nails with subungual debris  from hallux to fifth toes bilaterally. No evidence of bacterial infection or drainage bilaterally.  Orthopedic  No limitations of motion  feet .  No crepitus or effusions noted.  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  Prominent metatarsal heads  B/L.  Skin  normotropic skin with no porokeratosis noted bilaterally.  No signs of infections or ulcers noted.     Onychomycosis  Pain in right toes  Pain in left toes  Consent was obtained for treatment procedures.   Mechanical debridement of nails 1-5  bilaterally performed with a nail nipper.  Filed with dremel without incident.     Return office visit    10 weeks                  Told patient to return for periodic foot care and evaluation due to potential at risk complications.   Sumeet Geter DPM  

## 2022-03-26 DIAGNOSIS — Z20822 Contact with and (suspected) exposure to covid-19: Secondary | ICD-10-CM | POA: Diagnosis not present

## 2022-03-28 DIAGNOSIS — Z20822 Contact with and (suspected) exposure to covid-19: Secondary | ICD-10-CM | POA: Diagnosis not present

## 2022-04-05 DIAGNOSIS — H0100A Unspecified blepharitis right eye, upper and lower eyelids: Secondary | ICD-10-CM | POA: Diagnosis not present

## 2022-04-05 DIAGNOSIS — H10413 Chronic giant papillary conjunctivitis, bilateral: Secondary | ICD-10-CM | POA: Diagnosis not present

## 2022-04-20 DIAGNOSIS — J069 Acute upper respiratory infection, unspecified: Secondary | ICD-10-CM | POA: Diagnosis not present

## 2022-04-20 DIAGNOSIS — R051 Acute cough: Secondary | ICD-10-CM | POA: Diagnosis not present

## 2022-04-20 DIAGNOSIS — B9689 Other specified bacterial agents as the cause of diseases classified elsewhere: Secondary | ICD-10-CM | POA: Diagnosis not present

## 2022-05-16 DIAGNOSIS — Z86010 Personal history of colonic polyps: Secondary | ICD-10-CM | POA: Diagnosis not present

## 2022-05-16 DIAGNOSIS — Z09 Encounter for follow-up examination after completed treatment for conditions other than malignant neoplasm: Secondary | ICD-10-CM | POA: Diagnosis not present

## 2022-05-16 DIAGNOSIS — K5730 Diverticulosis of large intestine without perforation or abscess without bleeding: Secondary | ICD-10-CM | POA: Diagnosis not present

## 2022-05-16 DIAGNOSIS — Z9079 Acquired absence of other genital organ(s): Secondary | ICD-10-CM | POA: Diagnosis not present

## 2022-05-30 ENCOUNTER — Encounter: Payer: Self-pay | Admitting: Podiatry

## 2022-05-30 ENCOUNTER — Ambulatory Visit (INDEPENDENT_AMBULATORY_CARE_PROVIDER_SITE_OTHER): Payer: Medicare Other | Admitting: Podiatry

## 2022-05-30 DIAGNOSIS — B351 Tinea unguium: Secondary | ICD-10-CM | POA: Diagnosis not present

## 2022-05-30 DIAGNOSIS — H04122 Dry eye syndrome of left lacrimal gland: Secondary | ICD-10-CM | POA: Diagnosis not present

## 2022-05-30 DIAGNOSIS — E119 Type 2 diabetes mellitus without complications: Secondary | ICD-10-CM

## 2022-05-30 DIAGNOSIS — M79676 Pain in unspecified toe(s): Secondary | ICD-10-CM

## 2022-05-30 DIAGNOSIS — H04221 Epiphora due to insufficient drainage, right lacrimal gland: Secondary | ICD-10-CM | POA: Diagnosis not present

## 2022-05-30 NOTE — Progress Notes (Signed)
This patient returns to my office for at risk foot care.  This patient requires this care by a professional since this patient will be at risk due to having  Diabetes.   This patient is unable to cut nails himself since the patient cannot reach his nails.These nails are painful walking and wearing shoes.  Patient is also concerned about swelling in his feet. He says there is no pain in his swollen feet.   This patient presents for at risk foot care today.  General Appearance  Alert, conversant and in no acute stress.  Vascular  Dorsalis pedis and posterior tibial  pulses are weakly  palpable  bilaterally.  Capillary return is within normal limits  bilaterally. Temperature is within normal limits  Bilaterally.  Venous stasis both legs.  Swelling feet.  Neurologic  Senn-Weinstein monofilament wire test within normal limits  bilaterally. Muscle power within normal limits bilaterally.  Nails Thick disfigured discolored nails with subungual debris  from hallux to fifth toes bilaterally. No evidence of bacterial infection or drainage bilaterally.  Orthopedic  No limitations of motion  feet .  No crepitus or effusions noted.  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  Prominent metatarsal heads  B/L.  Skin  normotropic skin with no porokeratosis noted bilaterally.  No signs of infections or ulcers noted.     Onychomycosis  Pain in right toes  Pain in left toes  Consent was obtained for treatment procedures.   Mechanical debridement of nails 1-5  bilaterally performed with a nail nipper.  Filed with dremel without incident.     Return office visit    10 weeks                  Told patient to return for periodic foot care and evaluation due to potential at risk complications.   Mary Hockey DPM  

## 2022-06-20 DIAGNOSIS — C61 Malignant neoplasm of prostate: Secondary | ICD-10-CM | POA: Diagnosis not present

## 2022-06-21 ENCOUNTER — Other Ambulatory Visit: Payer: Self-pay | Admitting: Physician Assistant

## 2022-06-21 ENCOUNTER — Ambulatory Visit
Admission: RE | Admit: 2022-06-21 | Discharge: 2022-06-21 | Disposition: A | Discharge status: Home / Self Care | Payer: Medicare Other | Source: Ambulatory Visit | Attending: Physician Assistant | Admitting: Physician Assistant

## 2022-06-21 DIAGNOSIS — I10 Essential (primary) hypertension: Secondary | ICD-10-CM | POA: Diagnosis not present

## 2022-06-21 DIAGNOSIS — M545 Low back pain, unspecified: Secondary | ICD-10-CM

## 2022-07-04 DIAGNOSIS — E1140 Type 2 diabetes mellitus with diabetic neuropathy, unspecified: Secondary | ICD-10-CM | POA: Diagnosis not present

## 2022-07-04 DIAGNOSIS — R609 Edema, unspecified: Secondary | ICD-10-CM | POA: Diagnosis not present

## 2022-07-04 DIAGNOSIS — C61 Malignant neoplasm of prostate: Secondary | ICD-10-CM | POA: Diagnosis not present

## 2022-07-04 DIAGNOSIS — I10 Essential (primary) hypertension: Secondary | ICD-10-CM | POA: Diagnosis not present

## 2022-07-04 DIAGNOSIS — I739 Peripheral vascular disease, unspecified: Secondary | ICD-10-CM | POA: Diagnosis not present

## 2022-07-04 DIAGNOSIS — Z85820 Personal history of malignant melanoma of skin: Secondary | ICD-10-CM | POA: Diagnosis not present

## 2022-07-04 DIAGNOSIS — E785 Hyperlipidemia, unspecified: Secondary | ICD-10-CM | POA: Diagnosis not present

## 2022-07-04 DIAGNOSIS — F325 Major depressive disorder, single episode, in full remission: Secondary | ICD-10-CM | POA: Diagnosis not present

## 2022-07-04 DIAGNOSIS — N3281 Overactive bladder: Secondary | ICD-10-CM | POA: Diagnosis not present

## 2022-07-04 DIAGNOSIS — Z0000 Encounter for general adult medical examination without abnormal findings: Secondary | ICD-10-CM | POA: Diagnosis not present

## 2022-07-04 DIAGNOSIS — E6609 Other obesity due to excess calories: Secondary | ICD-10-CM | POA: Diagnosis not present

## 2022-07-06 DIAGNOSIS — H04221 Epiphora due to insufficient drainage, right lacrimal gland: Secondary | ICD-10-CM | POA: Diagnosis not present

## 2022-07-10 DIAGNOSIS — D225 Melanocytic nevi of trunk: Secondary | ICD-10-CM | POA: Diagnosis not present

## 2022-07-10 DIAGNOSIS — D2272 Melanocytic nevi of left lower limb, including hip: Secondary | ICD-10-CM | POA: Diagnosis not present

## 2022-07-10 DIAGNOSIS — Z85820 Personal history of malignant melanoma of skin: Secondary | ICD-10-CM | POA: Diagnosis not present

## 2022-07-10 DIAGNOSIS — L578 Other skin changes due to chronic exposure to nonionizing radiation: Secondary | ICD-10-CM | POA: Diagnosis not present

## 2022-07-10 DIAGNOSIS — L821 Other seborrheic keratosis: Secondary | ICD-10-CM | POA: Diagnosis not present

## 2022-07-10 DIAGNOSIS — L570 Actinic keratosis: Secondary | ICD-10-CM | POA: Diagnosis not present

## 2022-07-10 DIAGNOSIS — C84A0 Cutaneous T-cell lymphoma, unspecified, unspecified site: Secondary | ICD-10-CM | POA: Diagnosis not present

## 2022-07-26 DIAGNOSIS — C61 Malignant neoplasm of prostate: Secondary | ICD-10-CM | POA: Diagnosis not present

## 2022-07-26 DIAGNOSIS — N3946 Mixed incontinence: Secondary | ICD-10-CM | POA: Diagnosis not present

## 2022-07-26 DIAGNOSIS — R972 Elevated prostate specific antigen [PSA]: Secondary | ICD-10-CM | POA: Diagnosis not present

## 2022-07-26 DIAGNOSIS — N200 Calculus of kidney: Secondary | ICD-10-CM | POA: Diagnosis not present

## 2022-08-24 ENCOUNTER — Ambulatory Visit: Payer: Medicare Other | Admitting: Podiatry

## 2022-08-25 ENCOUNTER — Emergency Department (HOSPITAL_COMMUNITY): Payer: Medicare Other

## 2022-08-25 ENCOUNTER — Other Ambulatory Visit: Payer: Self-pay

## 2022-08-25 ENCOUNTER — Encounter (HOSPITAL_COMMUNITY): Payer: Self-pay

## 2022-08-25 ENCOUNTER — Emergency Department (HOSPITAL_COMMUNITY)
Admission: EM | Admit: 2022-08-25 | Discharge: 2022-08-25 | Disposition: A | Discharge status: Home / Self Care | Payer: Medicare Other | Attending: Emergency Medicine | Admitting: Emergency Medicine

## 2022-08-25 DIAGNOSIS — Z79899 Other long term (current) drug therapy: Secondary | ICD-10-CM | POA: Diagnosis not present

## 2022-08-25 DIAGNOSIS — Z20822 Contact with and (suspected) exposure to covid-19: Secondary | ICD-10-CM | POA: Diagnosis not present

## 2022-08-25 DIAGNOSIS — J069 Acute upper respiratory infection, unspecified: Secondary | ICD-10-CM | POA: Insufficient documentation

## 2022-08-25 DIAGNOSIS — J050 Acute obstructive laryngitis [croup]: Secondary | ICD-10-CM | POA: Diagnosis not present

## 2022-08-25 DIAGNOSIS — B9789 Other viral agents as the cause of diseases classified elsewhere: Secondary | ICD-10-CM | POA: Diagnosis not present

## 2022-08-25 DIAGNOSIS — R059 Cough, unspecified: Secondary | ICD-10-CM | POA: Diagnosis not present

## 2022-08-25 DIAGNOSIS — J040 Acute laryngitis: Secondary | ICD-10-CM | POA: Diagnosis not present

## 2022-08-25 LAB — RESP PANEL BY RT-PCR (FLU A&B, COVID) ARPGX2
Influenza A by PCR: NEGATIVE
Influenza B by PCR: NEGATIVE
SARS Coronavirus 2 by RT PCR: NEGATIVE

## 2022-08-25 MED ORDER — DEXAMETHASONE SODIUM PHOSPHATE 10 MG/ML IJ SOLN
10.0000 mg | Freq: Once | INTRAMUSCULAR | Status: AC
  Administered 2022-08-25: 10 mg via INTRAMUSCULAR
  Filled 2022-08-25: qty 1

## 2022-08-25 MED ORDER — BENZONATATE 100 MG PO CAPS: 100.0000 mg | ORAL_CAPSULE | Freq: Three times a day (TID) | ORAL | 0 refills | Status: DC

## 2022-08-25 NOTE — ED Notes (Signed)
Sandwich and beverage given to pt at this time

## 2022-08-25 NOTE — ED Notes (Signed)
Covid swab resent at this time

## 2022-08-25 NOTE — Discharge Instructions (Addendum)
It was a pleasure taking care of you today!  Your COVID, flu swabs are both negative.  Your chest x-ray did not show any concerning findings for pneumonia at this time.  Ensure to maintain fluid intake with hot tea, broth, soup, water.  You will be sent a prescription for [facility], take as prescribed.  You may take over-the-counter Mucinex as directed for your symptoms.  Call your primary care provider to set up a follow-up appointment regarding today's ED visit.

## 2022-08-25 NOTE — ED Provider Triage Note (Signed)
Emergency Medicine Provider Triage Evaluation Note  STEPHAN NELIS , a 81 y.o. male  was evaluated in triage.  Pt complains of cough. States he has had productive cough and rhinorrhea over the past few days. He denies feeling short of breath or having chest pain. Denies fevers, sore throat or sick contacts  Review of Systems  Positive: See above Negative:   Physical Exam  BP 129/70 (BP Location: Left Arm)   Pulse 87   Temp 98.3 F (36.8 C) (Oral)   Resp 16   Ht '5\' 7"'$  (1.702 m)   Wt 88 kg   SpO2 94%   BMI 30.39 kg/m  Gen:   Awake, no distress   Resp:  Normal effort, rales in RLL  MSK:   Moves extremities without difficulty  Other:  Hoarse.   Medical Decision Making  Medically screening exam initiated at 4:12 PM.  Appropriate orders placed.  [name] was informed that the remainder of the evaluation will be completed by another provider, this initial triage assessment does not replace that evaluation, and the importance of remaining in the ED until their evaluation is complete.  O2 sat 92-93 on room air when I evaluated him so will obtain CXR.    Mickie Hillier, PA-C 08/25/22 [phone]

## 2022-08-25 NOTE — ED Triage Notes (Signed)
Pt reports productive cough and runny nose x2 days. Requesting covid test.

## 2022-08-25 NOTE — ED Notes (Signed)
Pt ambulated in the hallway with RN and sats ranged 93-95%. Pt denies feeling SOB or having increased work of breathing with activity.

## 2022-08-25 NOTE — ED Provider Notes (Signed)
[facility] DEPT Provider Note   CSN: [id number] Arrival date & time: 08/25/22  1534     History  Chief Complaint  Patient presents with   Cough    Jay Bailey is a 81 y.o. male who presents to the emergency department with concerns for cough over the past 2 days.  Has associated rhinorrhea, nasal congestion.  No meds tried prior to arrival.  Denies sick contacts.  Denies chest pain, shortness of breath, fever, sore throat, trouble swallowing.  The history is provided by the patient. No language interpreter was used.       Home Medications Prior to Admission medications   Medication Sig Start Date End Date Taking? Authorizing Provider  benzonatate (TESSALON) 100 MG capsule Take 1 capsule (100 mg total) by mouth every 8 (eight) hours. 08/25/22  Yes Finnley Larusso A, PA-C  ACCU-CHEK GUIDE test strip  10/15/20   [provider]  amLODipine (NORVASC) 2.5 MG tablet Take 2.5 mg by mouth daily.    [provider]  amoxicillin-clavulanate (AUGMENTIN) 875-125 MG tablet Take 1 tablet by mouth every 12 (twelve) hours. 12/02/20   Georgette Shell, MD  ascorbic acid (VITAMIN C) 1000 MG tablet Take 1,000 mg by mouth daily.    [provider]  atorvastatin (LIPITOR) 80 MG tablet Take 80 mg by mouth daily.    [provider]  buPROPion (WELLBUTRIN XL) 150 MG 24 hr tablet 1 tablet every morning 01/18/21   [provider]  folic acid (FOLVITE) 086 MCG tablet Take 400 mcg by mouth daily.     [provider]  irbesartan-hydrochlorothiazide (AVALIDE) 150-12.5 MG tablet Take 1 tablet by mouth daily. 08/31/20   [provider]  loperamide (IMODIUM) 2 MG capsule Take 2 mg by mouth daily as needed for diarrhea or loose stools.    [provider]  metFORMIN (GLUCOPHAGE-XR) 500 MG 24 hr tablet Take 500 mg by mouth daily with breakfast. 11/27/18   [provider]  Multiple Vitamin (MULTIVITAMIN)  tablet Take 1 tablet by mouth daily.    [provider]  ondansetron (ZOFRAN) 4 MG tablet Take 1 tablet (4 mg total) by mouth every 6 (six) hours as needed for nausea. 12/02/20   Georgette Shell, MD  oxybutynin (DITROPAN-XL) 5 MG 24 hr tablet Take 1 tablet by mouth daily. 10/31/20   [provider]  pantoprazole (PROTONIX) 40 MG tablet Take 1 tablet (40 mg total) by mouth daily. 12/03/20   Georgette Shell, MD  prochlorperazine (COMPAZINE) 10 MG tablet Take 1 tablet (10 mg total) by mouth every 6 (six) hours as needed for nausea or vomiting. 11/16/20   Tyler Pita, MD  vitamin E 400 UNIT capsule Take 400 Units by mouth daily.    [provider]      Allergies    Lisinopril    Review of Systems   Review of Systems  Constitutional:  Negative for fever.  HENT:  Positive for congestion and rhinorrhea. Negative for sore throat and trouble swallowing.   Respiratory:  Positive for cough. Negative for shortness of breath.   Cardiovascular:  Negative for chest pain.  All other systems reviewed and are negative.   Physical Exam Updated Vital Signs BP 129/70 (BP Location: Left Arm)   Pulse 87   Temp 98.3 F (36.8 C) (Oral)   Resp 16   Ht '5\' 7"'$  (1.702 m)   Wt 88 kg   SpO2 94%   BMI 30.39 kg/m  Physical Exam Vitals and nursing note reviewed.  Constitutional:      General: He is not in acute distress.    Appearance: He is not diaphoretic.  HENT:     Head: Normocephalic and atraumatic.     Mouth/Throat:     Mouth: Mucous membranes are moist.     Pharynx: Oropharynx is clear. Uvula midline. No oropharyngeal exudate, posterior oropharyngeal erythema or uvula swelling.     Tonsils: No tonsillar exudate or tonsillar abscesses.     Comments: Uvula midline without swelling.  Patent airway.  No posterior pharyngeal erythema or tonsillar exudate noted on exam. Eyes:     General: No scleral icterus.    Conjunctiva/sclera: Conjunctivae normal.   Cardiovascular:     Rate and Rhythm: Normal rate and regular rhythm.     Pulses: Normal pulses.     Heart sounds: Normal heart sounds.  Pulmonary:     Effort: Pulmonary effort is normal. No respiratory distress.     Breath sounds: Normal breath sounds. No wheezing.     Comments: No increased work of breathing noted on exam. Abdominal:     General: Bowel sounds are normal.     Palpations: Abdomen is soft. There is no mass.     Tenderness: There is no abdominal tenderness. There is no guarding or rebound.  Musculoskeletal:        General: Normal range of motion.     Cervical back: Normal range of motion and neck supple.  Skin:    General: Skin is warm and dry.  Neurological:     Mental Status: He is alert.  Psychiatric:        Behavior: Behavior normal.     ED Results / Procedures / Treatments   Labs (all labs ordered are listed, but only abnormal results are displayed) Labs Reviewed  RESP PANEL BY RT-PCR (FLU A&B, COVID) [id number]    EKG None  Radiology DG Chest 2 View  Result Date: 08/25/2022 CLINICAL DATA:  Cough. EXAM: CHEST - 2 VIEW COMPARISON:  Chest x-ray 12/01/2010 FINDINGS: The heart size and mediastinal contours are within normal limits. Both lungs are clear. There is mild elevation of the left hemidiaphragm. The visualized skeletal structures are unremarkable. IMPRESSION: No active cardiopulmonary disease. Electronically Signed   By: Ronney Asters M.D.   On: 08/25/2022 16:53    Procedures Procedures    Medications Ordered in ED Medications  dexamethasone (DECADRON) injection 10 mg (10 mg Intramuscular Given 08/25/22 2010)    ED Course/ Medical Decision Making/ A&P Clinical Course as of 08/25/22 2106  Sat Aug 25, 2022  2007 Discussed with patient lab and imaging findings.  Discussed with patient discharge treatment plan.  Answered all questions.  Patient appears safe for discharge at this time. [SB]  2046 Notified by RN that ambulatory pulse ox noted to be at  93-95%.  Patient denied feeling shortness of breath during ambulation.  Patient then ambulated from the emergency department to his car. [SB]    Clinical Course User Index [SB] Mariacristina Aday A, PA-C                           Medical Decision Making Risk Prescription drug management.   Pt presents with concerns for cough x2 days.  Has associated rhinorrhea and nasal congestion.  No meds tried prior to arrival.  Denies sick contacts.  No chest pain, shortness of breath, fever, sore throat, trouble swallowing.  Patient afebrile.  On exam patient with uvula midline without swelling.  Patent airway.  No posterior pharyngeal erythema or tonsillar exudate noted on exam.  No acute cardiovascular respiratory exam findings. Differential diagnosis includes laryngitis, viral uri with cough, COVID, flu, PNA.    Co morbidities that complicate the patient evaluation: DM  Labs:  I ordered, and personally interpreted labs.  The pertinent results include:   COVID swab negative Flu swab negative  Imaging: I ordered imaging studies including Chest x-ray I independently visualized and interpreted imaging which showed: no acute findings. I agree with the radiologist interpretation  Medications:  I ordered medication including Decadron for symptom management I have reviewed the patients home medicines and have made adjustments as needed  Disposition: Presentation suspicious for viral URI with cough as well as laryngitis.  Doubt COVID, flu, pneumonia at this time. After consideration of the diagnostic results and the patients response to treatment, I feel that the patient would benefit from Discharge home.  Patient will be sent home with a prescription for [facility].  Also instructed patient to follow-up with primary care provider regarding today's ED visit.  Patient with decreased oxygen during ED visit, ambulatory pulse ox obtained and patient sats remained at 93-95% and he is asymptomatic without  shortness of breath or increased work of breathing.  Patient was able to ambulate to his car without difficulty.  Supportive care measures and strict return precautions discussed with patient at bedside. Pt acknowledges and verbalizes understanding. Pt appears safe for discharge. Follow up as indicated in discharge paperwork.    This chart was dictated using voice recognition software, Dragon. Despite the best efforts of this provider to proofread and correct errors, errors may still occur which can change documentation meaning.  Final Clinical Impression(s) / ED Diagnoses Final diagnoses:  Viral URI with cough  Laryngitis    Rx / DC Orders ED Discharge Orders          Ordered    benzonatate (TESSALON) 100 MG capsule  Every 8 hours        08/25/22 2009              Melenie Minniear A, PA-C 08/25/22 2108    Ottie Glazier, DO 08/25/22 2315

## 2022-08-29 DIAGNOSIS — J069 Acute upper respiratory infection, unspecified: Secondary | ICD-10-CM | POA: Diagnosis not present

## 2022-08-29 DIAGNOSIS — H1033 Unspecified acute conjunctivitis, bilateral: Secondary | ICD-10-CM | POA: Diagnosis not present

## 2022-09-05 ENCOUNTER — Encounter: Payer: Self-pay | Admitting: Podiatry

## 2022-09-05 ENCOUNTER — Ambulatory Visit (INDEPENDENT_AMBULATORY_CARE_PROVIDER_SITE_OTHER): Payer: Medicare Other | Admitting: Podiatry

## 2022-09-05 DIAGNOSIS — M79676 Pain in unspecified toe(s): Secondary | ICD-10-CM

## 2022-09-05 DIAGNOSIS — E119 Type 2 diabetes mellitus without complications: Secondary | ICD-10-CM

## 2022-09-05 DIAGNOSIS — B351 Tinea unguium: Secondary | ICD-10-CM | POA: Diagnosis not present

## 2022-09-05 NOTE — Progress Notes (Signed)
This patient returns to my office for at risk foot care.  This patient requires this care by a professional since this patient will be at risk due to having  Diabetes.   This patient is unable to cut nails himself since the patient cannot reach his nails.These nails are painful walking and wearing shoes.  Patient is also concerned about swelling in his feet. He says there is no pain in his swollen feet.   This patient presents for at risk foot care today.  General Appearance  Alert, conversant and in no acute stress.  Vascular  Dorsalis pedis and posterior tibial  pulses are weakly  palpable  bilaterally.  Capillary return is within normal limits  bilaterally. Temperature is within normal limits  Bilaterally.  Venous stasis both legs.  Swelling feet.  Neurologic  Senn-Weinstein monofilament wire test within normal limits  bilaterally. Muscle power within normal limits bilaterally.  Nails Thick disfigured discolored nails with subungual debris  from hallux to fifth toes bilaterally. No evidence of bacterial infection or drainage bilaterally.  Orthopedic  No limitations of motion  feet .  No crepitus or effusions noted.  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  Prominent metatarsal heads  B/L.  Skin  normotropic skin with no porokeratosis noted bilaterally.  No signs of infections or ulcers noted.     Onychomycosis  Pain in right toes  Pain in left toes  Consent was obtained for treatment procedures.   Mechanical debridement of nails 1-5  bilaterally performed with a nail nipper.  Filed with dremel without incident.     Return office visit    10 weeks                  Told patient to return for periodic foot care and evaluation due to potential at risk complications.   Lillyann Ahart DPM  

## 2022-09-13 DIAGNOSIS — H9192 Unspecified hearing loss, left ear: Secondary | ICD-10-CM | POA: Diagnosis not present

## 2022-09-13 DIAGNOSIS — H6590 Unspecified nonsuppurative otitis media, unspecified ear: Secondary | ICD-10-CM | POA: Diagnosis not present

## 2022-09-13 DIAGNOSIS — H6122 Impacted cerumen, left ear: Secondary | ICD-10-CM | POA: Diagnosis not present

## 2022-09-14 ENCOUNTER — Emergency Department (HOSPITAL_COMMUNITY): Payer: Medicare Other

## 2022-09-14 ENCOUNTER — Observation Stay (HOSPITAL_COMMUNITY)
Admission: EM | Admit: 2022-09-14 | Discharge: 2022-09-16 | Disposition: A | Discharge status: Home Health | Payer: Medicare Other | Attending: Internal Medicine | Admitting: Internal Medicine

## 2022-09-14 DIAGNOSIS — R000 Tachycardia, unspecified: Secondary | ICD-10-CM | POA: Diagnosis not present

## 2022-09-14 DIAGNOSIS — Z79899 Other long term (current) drug therapy: Secondary | ICD-10-CM | POA: Insufficient documentation

## 2022-09-14 DIAGNOSIS — I4729 Other ventricular tachycardia: Secondary | ICD-10-CM | POA: Diagnosis not present

## 2022-09-14 DIAGNOSIS — Z7901 Long term (current) use of anticoagulants: Secondary | ICD-10-CM | POA: Insufficient documentation

## 2022-09-14 DIAGNOSIS — Z7982 Long term (current) use of aspirin: Secondary | ICD-10-CM | POA: Diagnosis not present

## 2022-09-14 DIAGNOSIS — Z96643 Presence of artificial hip joint, bilateral: Secondary | ICD-10-CM | POA: Diagnosis not present

## 2022-09-14 DIAGNOSIS — I10 Essential (primary) hypertension: Secondary | ICD-10-CM | POA: Diagnosis not present

## 2022-09-14 DIAGNOSIS — I491 Atrial premature depolarization: Secondary | ICD-10-CM | POA: Diagnosis not present

## 2022-09-14 DIAGNOSIS — Z85828 Personal history of other malignant neoplasm of skin: Secondary | ICD-10-CM | POA: Insufficient documentation

## 2022-09-14 DIAGNOSIS — R110 Nausea: Secondary | ICD-10-CM | POA: Diagnosis not present

## 2022-09-14 DIAGNOSIS — E1165 Type 2 diabetes mellitus with hyperglycemia: Secondary | ICD-10-CM | POA: Diagnosis not present

## 2022-09-14 DIAGNOSIS — Z7902 Long term (current) use of antithrombotics/antiplatelets: Secondary | ICD-10-CM | POA: Insufficient documentation

## 2022-09-14 DIAGNOSIS — G459 Transient cerebral ischemic attack, unspecified: Secondary | ICD-10-CM | POA: Diagnosis not present

## 2022-09-14 DIAGNOSIS — Z794 Long term (current) use of insulin: Secondary | ICD-10-CM | POA: Insufficient documentation

## 2022-09-14 DIAGNOSIS — Z7984 Long term (current) use of oral hypoglycemic drugs: Secondary | ICD-10-CM | POA: Insufficient documentation

## 2022-09-14 DIAGNOSIS — Z8546 Personal history of malignant neoplasm of prostate: Secondary | ICD-10-CM | POA: Diagnosis not present

## 2022-09-14 DIAGNOSIS — R29810 Facial weakness: Secondary | ICD-10-CM | POA: Diagnosis not present

## 2022-09-14 DIAGNOSIS — I6381 Other cerebral infarction due to occlusion or stenosis of small artery: Secondary | ICD-10-CM

## 2022-09-14 DIAGNOSIS — Z859 Personal history of malignant neoplasm, unspecified: Secondary | ICD-10-CM | POA: Diagnosis not present

## 2022-09-14 DIAGNOSIS — M79662 Pain in left lower leg: Secondary | ICD-10-CM | POA: Insufficient documentation

## 2022-09-14 DIAGNOSIS — E119 Type 2 diabetes mellitus without complications: Secondary | ICD-10-CM

## 2022-09-14 DIAGNOSIS — R531 Weakness: Secondary | ICD-10-CM | POA: Diagnosis present

## 2022-09-14 LAB — CBC
HCT: 37.6 % — ABNORMAL LOW (ref 39.0–52.0)
Hemoglobin: 12.6 g/dL — ABNORMAL LOW (ref 13.0–17.0)
MCH: 32.9 pg (ref 26.0–34.0)
MCHC: 33.5 g/dL (ref 30.0–36.0)
MCV: 98.2 fL (ref 80.0–100.0)
Platelets: 180 10*3/uL (ref 150–400)
RBC: 3.83 MIL/uL — ABNORMAL LOW (ref 4.22–5.81)
RDW: 13.3 % (ref 11.5–15.5)
WBC: 6.5 10*3/uL (ref 4.0–10.5)
nRBC: 0 % (ref 0.0–0.2)

## 2022-09-14 LAB — COMPREHENSIVE METABOLIC PANEL
ALT: 29 U/L (ref 0–44)
AST: 26 U/L (ref 15–41)
Albumin: 4 g/dL (ref 3.5–5.0)
Alkaline Phosphatase: 89 U/L (ref 38–126)
Anion gap: 9 (ref 5–15)
BUN: 18 mg/dL (ref 8–23)
CO2: 24 mmol/L (ref 22–32)
Calcium: 9.7 mg/dL (ref 8.9–10.3)
Chloride: 107 mmol/L (ref 98–111)
Creatinine, Ser: 1.05 mg/dL (ref 0.61–1.24)
GFR, Estimated: >60 mL/min (ref >60)
Glucose, Bld: 144 mg/dL — ABNORMAL HIGH (ref 70–99)
Potassium: 3.8 mmol/L (ref 3.5–5.1)
Sodium: 140 mmol/L (ref 135–145)
Total Bilirubin: 0.4 mg/dL (ref 0.3–1.2)
Total Protein: 6.8 g/dL (ref 6.5–8.1)

## 2022-09-14 LAB — I-STAT CHEM 8, ED
BUN: 21 mg/dL (ref 8–23)
Calcium, Ion: 1.25 mmol/L (ref 1.15–1.40)
Chloride: 103 mmol/L (ref 98–111)
Creatinine, Ser: 1 mg/dL (ref 0.61–1.24)
Glucose, Bld: 141 mg/dL — ABNORMAL HIGH (ref 70–99)
HCT: 40 % (ref 39.0–52.0)
Hemoglobin: 13.6 g/dL (ref 13.0–17.0)
Potassium: 3.8 mmol/L (ref 3.5–5.1)
Sodium: 140 mmol/L (ref 135–145)
TCO2: 25 mmol/L (ref 22–32)

## 2022-09-14 LAB — DIFFERENTIAL
Abs Immature Granulocytes: 0.01 10*3/uL (ref 0.00–0.07)
Basophils Absolute: 0 10*3/uL (ref 0.0–0.1)
Basophils Relative: 1 %
Eosinophils Absolute: 0.2 10*3/uL (ref 0.0–0.5)
Eosinophils Relative: 3 %
Immature Granulocytes: 0 %
Lymphocytes Relative: 16 %
Lymphs Abs: 1 10*3/uL (ref 0.7–4.0)
Monocytes Absolute: 0.6 10*3/uL (ref 0.1–1.0)
Monocytes Relative: 9 %
Neutro Abs: 4.7 10*3/uL (ref 1.7–7.7)
Neutrophils Relative %: 71 %

## 2022-09-14 LAB — PROTIME-INR
INR: 1 (ref 0.8–1.2)
Prothrombin Time: 13.4 seconds (ref 11.4–15.2)

## 2022-09-14 LAB — CBG MONITORING, ED: Glucose-Capillary: 146 mg/dL — ABNORMAL HIGH (ref 70–99)

## 2022-09-14 LAB — APTT: aPTT: 24 seconds (ref 24–36)

## 2022-09-14 LAB — TROPONIN I (HIGH SENSITIVITY): Troponin I (High Sensitivity): 9 ng/L (ref <18)

## 2022-09-14 LAB — BRAIN NATRIURETIC PEPTIDE: B Natriuretic Peptide: 70.5 pg/mL (ref 0.0–100.0)

## 2022-09-14 LAB — ETHANOL: Alcohol, Ethyl (B): <10 mg/dL (ref <10)

## 2022-09-14 LAB — MAGNESIUM: Magnesium: 1.5 mg/dL — ABNORMAL LOW (ref 1.7–2.4)

## 2022-09-14 MED ORDER — MAGNESIUM SULFATE 2 GM/50ML IV SOLN
2.0000 g | Freq: Once | INTRAVENOUS | Status: AC
  Administered 2022-09-15: 2 g via INTRAVENOUS
  Filled 2022-09-14: qty 50

## 2022-09-14 MED ORDER — ASPIRIN 81 MG PO CHEW
81.0000 mg | CHEWABLE_TABLET | Freq: Every day | ORAL | Status: DC
  Administered 2022-09-14 – 2022-09-16 (×3): 81 mg via ORAL
  Filled 2022-09-14 (×3): qty 1

## 2022-09-14 MED ORDER — POTASSIUM CHLORIDE 20 MEQ PO PACK
20.0000 meq | PACK | Freq: Once | ORAL | Status: AC
  Administered 2022-09-15: 20 meq via ORAL
  Filled 2022-09-14: qty 1

## 2022-09-14 MED ORDER — CLOPIDOGREL BISULFATE 75 MG PO TABS
75.0000 mg | ORAL_TABLET | Freq: Every day | ORAL | Status: DC
  Administered 2022-09-14 – 2022-09-16 (×3): 75 mg via ORAL
  Filled 2022-09-14 (×3): qty 1

## 2022-09-14 MED ORDER — ASPIRIN 300 MG RE SUPP
300.0000 mg | Freq: Every day | RECTAL | Status: DC
  Filled 2022-09-14: qty 1

## 2022-09-14 MED ORDER — SODIUM CHLORIDE 0.9% FLUSH
3.0000 mL | Freq: Once | INTRAVENOUS | Status: AC
  Administered 2022-09-14: 3 mL via INTRAVENOUS

## 2022-09-14 NOTE — Consult Note (Addendum)
NEUROLOGY CONSULTATION NOTE   Date of service: September 14, 2022 Patient Name: Jay Bailey MRN:  [id number] DOB:  February 23, 1941 Reason for consult: "L sided weakness" Requesting Provider: Charlesetta Shanks, MD _ _ _   _ __   _ __ _ _  __ __   _ __   __ _  History of Present Illness  Jay Bailey is a 81 y.o. male with PMH significant for DM2, HTN, HLD, hernia, prostrate cancer, amputation of left index finger, depression, melanoma who presents with sudden onset L sided weakness.  He was at the church and was fine at 1830. Was noted confused and concern for L facial droop. EMS called and noted that he was unable to lieft his L leg despite multiple attempts and patient getting frustrated.  He was brought in as a code stroke and his symptoms had resolved by the time of arrival. Of note, he missed his BP meds this AM and was significantly hypertensive to 742 systolic per EMS. EMS also report that theie cardiac monitor showed a long run of PVCs when patient reported feeling funny.  LKW: 5956 on 09/14/22. mRS: 0 tNKASE: not offered, resolution of symptoms. Thrombectomy: not offered, resolution of symptoms. NIHSS components Score: Comment  1a Level of Conscious 0'[x]'$  1'[]'$  2'[]'$  3'[]'$      1b LOC Questions 0'[x]'$  1'[]'$  2'[]'$       1c LOC Commands 0'[x]'$  1'[]'$  2'[]'$       2 Best Gaze 0'[x]'$  1'[]'$  2'[]'$       3 Visual 0'[x]'$  1'[]'$  2'[]'$  3'[]'$      4 Facial Palsy 0'[x]'$  1'[]'$  2'[]'$  3'[]'$      5a Motor Arm - left 0'[x]'$  1'[]'$  2'[]'$  3'[]'$  4'[]'$  UN'[]'$    5b Motor Arm - Right 0'[x]'$  1'[]'$  2'[]'$  3'[]'$  4'[]'$  UN'[]'$    6a Motor Leg - Left 0'[x]'$  1'[]'$  2'[]'$  3'[]'$  4'[]'$  UN'[]'$    6b Motor Leg - Right 0'[x]'$  1'[]'$  2'[]'$  3'[]'$  4'[]'$  UN'[]'$    7 Limb Ataxia 0'[x]'$  1'[]'$  2'[]'$  3'[]'$  UN'[]'$     8 Sensory 0'[x]'$  1'[]'$  2'[]'$  UN'[]'$      9 Best Language 0'[x]'$  1'[]'$  2'[]'$  3'[]'$      10 Dysarthria 0'[x]'$  1'[]'$  2'[]'$  UN'[]'$      11 Extinct. and Inattention 0'[x]'$  1'[]'$  2'[]'$       TOTAL: 0      ROS   Constitutional Denies weight loss, fever and chills.   HEENT Denies changes in vision and hearing.   Respiratory Denies SOB and cough.   CV  Denies palpitations and CP   GI Denies abdominal pain, nausea, vomiting and diarrhea.   GU Denies dysuria and urinary frequency.   MSK Denies myalgia and joint pain.   Skin Denies rash and pruritus.   Neurological Denies headache and syncope.   Psychiatric Denies recent changes in mood. Denies anxiety and depression.    Past History   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   Cancer ([place])    Congenital deformity of ankle joint    Depression    a long time ago    Diabetes mellitus without complication (HCC)    type 2   Hyperlipidemia    Hypertension    Inguinal hernia    Prostate cancer ([place])    Skin cancer (melanoma) ([place])    historical    Wears hearing aid    bialteral  Past Surgical History:  Procedure Laterality Date   INGUINAL HERNIA REPAIR     right and left hernias  miltiple times with mesh placement    INGUINAL HERNIA REPAIR Left 12/26/2018   Procedure: OPEN LEFT INGUINAL HERNIA WITH MESH;  Surgeon: Johnathan Hausen, MD;  Location: [facility]S;  Service: General;  Laterality: Left;   nose myeloma     PROSTATECTOMY  2005   TOTAL HIP ARTHROPLASTY  "long time ago "   bilateral ; [place]    Family History  Problem Relation Age of Onset   Colon cancer Mother    Kidney cancer Mother    Heart disease Father    Lung cancer Brother    Pancreatic cancer Neg Hx    Prostate cancer Neg Hx    Breast cancer Neg Hx    Social History   Socioeconomic History   Marital status: Single    Spouse name: Not on file   Number of children: Not on file   Years of education: Not on file   Highest education level: Not on file  Occupational History   Not on file  Tobacco Use   Smoking status: Never   Smokeless tobacco: Never  Vaping Use   Vaping Use: Never used  Substance and Sexual Activity   Alcohol use: No   Drug use: No   Sexual activity: Not Currently   Other Topics Concern   Not on file  Social History Narrative   Not on file   Social Determinants of Health   Financial Resource Strain: Not on file  Food Insecurity: Not on file  Transportation Needs: Not on file  Physical Activity: Not on file  Stress: Not on file  Social Connections: Not on file   Allergies  Allergen Reactions   Lisinopril Cough    Other reaction(s): cough Other reaction(s): cough    Medications  (Not in a hospital admission)    Vitals   Vitals:   09/14/22 2000  Weight: 100 kg     Body mass index is 34.53 kg/m.  Physical Exam   General: Laying comfortably in bed; in no acute distress.  HENT: Normal oropharynx and mucosa. Normal external appearance of ears and nose.  Neck: Supple, no pain or tenderness  CV: No JVD. No peripheral edema.  Pulmonary: Symmetric Chest rise. Normal respiratory effort.  Abdomen: Soft to touch, non-tender.  Ext: No cyanosis, edema, or deformity  Skin: No rash. Normal palpation of skin.   Musculoskeletal: Normal digits and nails by inspection. No clubbing.   Neurologic Examination  Mental status/Cognition: Alert, oriented to self, place, month and year, good attention.  Speech/language: Fluent, comprehension intact, object naming intact, repetition intact.  Cranial nerves:   CN II Pupils equal and reactive to light, no VF deficits    CN III,IV,VI EOM intact, no gaze preference or deviation, no nystagmus    CN V normal sensation in V1, V2, and V3 segments bilaterally    CN VII no asymmetry, no nasolabial fold flattening    CN VIII normal hearing to speech   CN IX & X normal palatal elevation, no uvular deviation    CN XI 5/5 head turn and 5/5 shoulder shrug bilaterally    CN XII midline tongue protrusion    Motor:  Muscle bulk: normal, tone normal, pronator drift none tremor none Mvmt Root Nerve  Muscle Right Left Comments  SA C5/6 Ax Deltoid 5 5   EF C5/6 Mc Biceps 5 5   EE C6/7/8 Rad  Triceps 5 5   WF C6/7  Med FCR     WE C7/8 PIN ECU     F Ab C8/T1 U ADM/FDI 5 5   HF L1/2/3 Fem Illopsoas 5 5   KE L2/3/4 Fem Quad 5 5   DF L4/5 D Peron Tib Ant 5 5   PF S1/2 Tibial Grc/Sol 5 5    Reflexes:  Right Left Comments  Pectoralis      Biceps (C5/6) 2 2   Brachioradialis (C5/6) 2 2    Triceps (C6/7) 2 2    Patellar (L3/4) 2 2    Achilles (S1)      Hoffman      Plantar     Jaw jerk    Sensation:  Light touch Intact throughout   Pin prick    Temperature    Vibration   Proprioception    Coordination/Complex Motor:  - Finger to Nose intact BL - Heel to shin intact BL - Rapid alternating movement intact BL - Gait: Deferred for patient's safety. Labs   CBC:  Recent Labs  Lab 09/14/22 2029  HGB 13.6  HCT 10.1    Basic Metabolic Panel:  Lab Results  Component Value Date   NA 140 09/14/2022   K 3.8 09/14/2022   CO2 27 12/02/2020   GLUCOSE 141 (H) 09/14/2022   BUN 21 09/14/2022   CREATININE 1.00 09/14/2022   CALCIUM 8.2 (L) 12/02/2020   GFRNONAA >60 12/02/2020   GFRAA 48 (L) 02/28/2020   Lipid Panel: No results found for: "LDLCALC" HgbA1c:  Lab Results  Component Value Date   HGBA1C 6.3 (H) 11/28/2020   Urine Drug Screen: No results found for: "LABOPIA", "COCAINSCRNUR", "LABBENZ", "AMPHETMU", "THCU", "LABBARB"  Alcohol Level No results found for: "ETH"  CT Head without contrast(Personally reviewed): CTH was negative for a large hypodensity concerning for a large territory infarct or hyperdensity concerning for an ICH  MRI Brain(Personally reviewed): Pending  Impression   Jay Bailey is a 81 y.o. male with PMH significant for DM2, HTN, HLD, hernia, prostrate cancer, amputation of left index finger, depression, melanoma who presents with sudden onset L sided weakness.  Symptoms have resolved by the time he arrived to the ED.  With significant stroke risk factors, I am worried that this was probably a minor ischemic stroke or TIA given the acute onset of the  symptoms.  He was not offered TNKase or thrombectomy due to complete resolution of symptoms and NIH stroke scale of 0.  He did miss his antihypertensives this morning with a systolic blood pressure running in 200s per EMS.  Hypertensive encephalopathy is also in the differential but I would not expect it to cause focal deficit.  Primary Diagnosis:  Other cerebral infarction due to occlusion of stenosis of small artery.  Secondary Diagnosis: Essential (primary) hypertension, Hypertension Emergency (SBP > 180 or DBP > 120 & end organ damage), Type 2 diabetes mellitus with hyperglycemia , and Obesity  Recommendations   - Frequent Neuro checks per stroke unit protocol - Recommend brain imaging with MRI Brain without contrast - Recommend obtaining TTE - Recommend obtaining Lipid panel with LDL - Please start statin if LDL > 70 - Recommend HbA1c - Antithrombotic -aspirin 81 mg daily along with Plavix 75 mg daily for 21 days, followed by aspirin 81 mg daily alone. - Recommend DVT ppx - SBP goal - permissive hypertension first 24 h < 220/110. Held home meds.  - Recommend Telemetry monitoring for arrythmia - Recommend  bedside swallow screen prior to PO intake. - Stroke education booklet - Recommend PT/OT/SLP consult -Reactivate code stroke if patient develops new focal deficit or has recurrence of his symptoms.  ______________________________________________________________________   Thank you for the opportunity to take part in the care of this patient. If you have any further questions, please contact the neurology consultation attending.  Signed,  [place] Pager Number [phone] _ _ _   _ __   _ __ _ _  __ __   _ __   __ _

## 2022-09-14 NOTE — ED Provider Notes (Incomplete)
[facility] EMERGENCY DEPARTMENT Provider Note   CSN: [id number] Arrival date & time: 09/14/22  2024     History {Add pertinent medical, surgical, social history, OB history to HPI:1} No chief complaint on file.   Jay Bailey is a 81 y.o. male.  HPI  Patient is a 81 year old male presenting to the emergency department as a code stroke.  Patient's last known normal was approximately 6:30 PM.  Patient was reportedly at a church service whenever he began having slurred speech, left-sided facial droop, left leg and left arm weakness.  On EMS arrival, patient was having significant weakness of his left lower extremity was noted to have left-sided facial droop.  Patient was occasionally dysarthric.  Patient point-of-care blood glucose within normal range.  EKG in route with EMS initially showed sinus rhythm with frequent PVCs however patient began to feel diaphoretic and stated that he "felt funny" and went into a wide-complex rhythm, concerning for a 4 beat run of V. tach.  Patient was hemodynamically stable in route, had no episodes of hypotension or loss conscious during this episode.  Patient has no known cardiac history.  Patient documented chest pain, shortness of breath, cough, congestion.    Home Medications Prior to Admission medications   Medication Sig Start Date End Date Taking? Authorizing Provider  ACCU-CHEK GUIDE test strip  10/15/20   [provider]  amLODipine (NORVASC) 2.5 MG tablet Take 2.5 mg by mouth daily.    [provider]  amoxicillin-clavulanate (AUGMENTIN) 875-125 MG tablet Take 1 tablet by mouth every 12 (twelve) hours. 12/02/20   Georgette Shell, MD  ascorbic acid (VITAMIN C) 1000 MG tablet Take 1,000 mg by mouth daily.    [provider]  atorvastatin (LIPITOR) 80 MG tablet Take 80 mg by mouth daily.    [provider]  benzonatate (TESSALON) 100 MG capsule Take 1 capsule (100 mg total) by mouth every  8 (eight) hours. 08/25/22   Blue, Soijett A, PA-C  buPROPion (WELLBUTRIN XL) 150 MG 24 hr tablet 1 tablet every morning 01/18/21   [provider]  folic acid (FOLVITE) 175 MCG tablet Take 400 mcg by mouth daily.     [provider]  irbesartan-hydrochlorothiazide (AVALIDE) 150-12.5 MG tablet Take 1 tablet by mouth daily. 08/31/20   [provider]  loperamide (IMODIUM) 2 MG capsule Take 2 mg by mouth daily as needed for diarrhea or loose stools.    [provider]  metFORMIN (GLUCOPHAGE-XR) 500 MG 24 hr tablet Take 500 mg by mouth daily with breakfast. 11/27/18   [provider]  Multiple Vitamin (MULTIVITAMIN) tablet Take 1 tablet by mouth daily.    [provider]  ondansetron (ZOFRAN) 4 MG tablet Take 1 tablet (4 mg total) by mouth every 6 (six) hours as needed for nausea. 12/02/20   Georgette Shell, MD  oxybutynin (DITROPAN-XL) 5 MG 24 hr tablet Take 1 tablet by mouth daily. 10/31/20   [provider]  pantoprazole (PROTONIX) 40 MG tablet Take 1 tablet (40 mg total) by mouth daily. 12/03/20   Georgette Shell, MD  prochlorperazine (COMPAZINE) 10 MG tablet Take 1 tablet (10 mg total) by mouth every 6 (six) hours as needed for nausea or vomiting. 11/16/20   Tyler Pita, MD  vitamin E 400 UNIT capsule Take 400 Units by mouth daily.    [provider]      Allergies    Lisinopril    Review of Systems  Review of Systems  Physical Exam Updated Vital Signs Wt 100 kg   BMI 34.53 kg/m  Physical Exam Vitals and nursing note reviewed.  Constitutional:      General: He is not in acute distress.    Appearance: He is well-developed. He is not ill-appearing.     Comments: Elderly, generally well-appearing male  HENT:     Head: Normocephalic and atraumatic.  Eyes:     Conjunctiva/sclera: Conjunctivae normal.  Cardiovascular:     Rate and Rhythm: Normal rate and regular rhythm.     Heart sounds: No murmur  heard. Pulmonary:     Effort: Pulmonary effort is normal. No respiratory distress.     Breath sounds: Normal breath sounds.  Abdominal:     Palpations: Abdomen is soft.     Tenderness: There is no abdominal tenderness.  Musculoskeletal:        General: No swelling.     Cervical back: Neck supple.  Skin:    General: Skin is warm and dry.     Capillary Refill: Capillary refill takes less than 2 seconds.  Neurological:     General: No focal deficit present.     Mental Status: He is alert and oriented to person, place, and time.     Cranial Nerves: No cranial nerve deficit.     Sensory: No sensory deficit.     Motor: No weakness.     Coordination: Coordination normal.  Psychiatric:        Mood and Affect: Mood normal.     ED Results / Procedures / Treatments   Labs (all labs ordered are listed, but only abnormal results are displayed) Labs Reviewed  PROTIME-INR  APTT  CBC  DIFFERENTIAL  COMPREHENSIVE METABOLIC PANEL  ETHANOL  I-STAT CHEM 8, ED  CBG MONITORING, ED    EKG None  Radiology No results found.  Procedures Procedures  {Document cardiac monitor, telemetry assessment procedure when appropriate:1}  Medications Ordered in ED Medications  sodium chloride flush (NS) 0.9 % injection 3 mL (has no administration in time range)    ED Course/ Medical Decision Making/ A&P Clinical Course as of 09/14/22 2333  Fri Sep 14, 2022  2049 Patient is a 81 year old male presenting to the emergency department as a code stroke.  Patient's last known normal was approximately 6:30 PM.  Patient was reportedly at a church service whenever he began having slurred speech, left-sided facial droop, left leg and left arm weakness.  On EMS arrival, patient was having significant weakness of his left lower extremity was noted to have left-sided facial droop.  Patient was occasionally dysarthric.  Patient point-of-care blood glucose within normal range.  EKG in route with EMS initially  showed sinus rhythm with frequent PVCs however patient began to feel diaphoretic and stated that he "felt funny" and went into a wide-complex rhythm, concerning for a 4 beat run of V. tach.  Patient was hemodynamically stable in route, had no episodes of hypotension or loss conscious during this episode.  Patient has no known cardiac history.  Patient documented chest pain, shortness of breath, cough, congestion. [JN]  2054 On arrival to the emergency department, patient was hemodynamically stable, NIH stroke scale of 0.  Patient not have significant facial droop or lower extremity weakness.  Neurology at the bedside evaluated patient, did not feel the patient needed a full CTA head neck at this time as his NIH stroke scale is 0.  CT head obtained.  Neurology felt the patient presentation is  likely secondary to that of TIA due to his reported deficits with EMS the patient significant symptomatic improvement.  Patient will require admission for TIA evaluation. [JN]    Clinical Course User Index [JN] Toby Ayad, [place], MD                           Medical Decision Making Amount and/or Complexity of Data Reviewed Labs: ordered. Radiology: ordered.  Risk Prescription drug management. Decision regarding hospitalization.   Medical Decision Making  This patient is Presenting for Evaluation as a code stroke, patient has a past medical history hypertension, hyperlipidemia, diabetes which complicates their presentation.  Of which does require a range of treatment options, and is a complaint that involves a high risk of morbidity and mortality.  Arrived in ED by:  EMS History obtained from: The patient  Limitations in history: Patient generally poor historian   At this time I am most concerned for TIA. Also considering ischemic stroke, hemorrhagic stroke, carotid artery stenosis, metabolic abnormality, PVCs, tachydysrhythmia, STEMI, NSTEMI,. Plan for laboratory and imaging study work-up***    EKG: I  interpreted the ECG. It reveals a sinus rhythm with frequent PVCs. The QTc, PR, and QRS are appropriate. There are no signs of acute ischemia or of significant electrical abnormalities. The ECG does not show a STEMI. There are no ST depressions. ***There are no T wave inversions. There is no evidence of a High-Grade Conduction Block.   Laboratory work-up significant for:   ***   Radiologic work-up was significant for:  ***   Interventions and Interval History: ***        Consults:  *** Recommendations: ***   Decision rules/scores evaluated: ***   Additional documents reviewed: ***    Disposition: Due to the patients current presenting symptoms, physical exam findings, and the workup stated above, it is thought that the etiology of the patients current presentation is ***    ***ADMIT: Patient is thought to require admission for ***. Patient will be admitted to *** service. Please see in patient provider note for additional treatment plan details.   ***Discharge: Patient is felt to be medically appropriate for discharge at this time. Patient was informed of all pertinent physical exam, laboratory, and imaging findings. Patients suspected etiology of their symptom presentation was discussed with the patient and all questions were answered. Patient was instructed to follow up with their primary care doctor in *** days for re-evaluation. Patient was given strict return precautions.   ***Handoff: At the time of signout, the patients *** had not yet been completed. Handoff was provided to Dr. Marland Kitchen , please see their note for additional treatment plan details.   The plan for this patient was discussed with Dr. ***, who voiced agreement and who oversaw evaluation and treatment of this patient.     Clinical Complexity  A medically appropriate history, review of systems, and physical exam was performed.   I personally reviewed the lab and imaging studies discussed  above.   MDM generated using voice dictation software and may contain dictation errors. Please contact me for any clarification or with any questions.         {Document critical care time when appropriate:1} {Document review of labs and clinical decision tools ie heart score, Chads2Vasc2 etc:1}  {Document your independent review of radiology images, and any outside records:1} {Document your discussion with family members, caretakers, and with consultants:1} {Document social determinants of health affecting pt's care:1} {Document your  decision making why or why not admission, treatments were needed:1} Final Clinical Impression(s) / ED Diagnoses Final diagnoses:  None    Rx / DC Orders ED Discharge Orders     None

## 2022-09-14 NOTE — ED Triage Notes (Signed)
Pt arrived via [facility] for code stroke, LSN [id number]. [place], friend noticed garbled speech, left sided facial droop, left leg weakness. EMS noted these symptoms at time of their arrival, called code stroke. Pt symptoms mostly resolved by time of arrival to ED. 18g LAC. Of concern, pt had multiple sustained runs of PVCs during transport accompanied by nausea.   PTA EMS Vitals   BP 204/98 CBG 140 SPO2 100% RA RR 16

## 2022-09-14 NOTE — ED Provider Notes (Signed)
[facility] EMERGENCY DEPARTMENT Provider Note   CSN: [id number] Arrival date & time: 09/14/22  2024     History {Add pertinent medical, surgical, social history, OB history to HPI:1} No chief complaint on file.   Jay Bailey is a 81 y.o. male.  HPI     Home Medications Prior to Admission medications   Medication Sig Start Date End Date Taking? Authorizing Provider  ACCU-CHEK GUIDE test strip  10/15/20   [provider]  amLODipine (NORVASC) 2.5 MG tablet Take 2.5 mg by mouth daily.    [provider]  amoxicillin-clavulanate (AUGMENTIN) 875-125 MG tablet Take 1 tablet by mouth every 12 (twelve) hours. 12/02/20   Georgette Shell, MD  ascorbic acid (VITAMIN C) 1000 MG tablet Take 1,000 mg by mouth daily.    [provider]  atorvastatin (LIPITOR) 80 MG tablet Take 80 mg by mouth daily.    [provider]  benzonatate (TESSALON) 100 MG capsule Take 1 capsule (100 mg total) by mouth every 8 (eight) hours. 08/25/22   Blue, Soijett A, PA-C  buPROPion (WELLBUTRIN XL) 150 MG 24 hr tablet 1 tablet every morning 01/18/21   [provider]  folic acid (FOLVITE) 308 MCG tablet Take 400 mcg by mouth daily.     [provider]  irbesartan-hydrochlorothiazide (AVALIDE) 150-12.5 MG tablet Take 1 tablet by mouth daily. 08/31/20   [provider]  loperamide (IMODIUM) 2 MG capsule Take 2 mg by mouth daily as needed for diarrhea or loose stools.    [provider]  metFORMIN (GLUCOPHAGE-XR) 500 MG 24 hr tablet Take 500 mg by mouth daily with breakfast. 11/27/18   [provider]  Multiple Vitamin (MULTIVITAMIN) tablet Take 1 tablet by mouth daily.    [provider]  ondansetron (ZOFRAN) 4 MG tablet Take 1 tablet (4 mg total) by mouth every 6 (six) hours as needed for nausea. 12/02/20   Georgette Shell, MD  oxybutynin (DITROPAN-XL) 5 MG 24 hr tablet Take 1 tablet by mouth daily.  10/31/20   [provider]  pantoprazole (PROTONIX) 40 MG tablet Take 1 tablet (40 mg total) by mouth daily. 12/03/20   Georgette Shell, MD  prochlorperazine (COMPAZINE) 10 MG tablet Take 1 tablet (10 mg total) by mouth every 6 (six) hours as needed for nausea or vomiting. 11/16/20   Tyler Pita, MD  vitamin E 400 UNIT capsule Take 400 Units by mouth daily.    [provider]      Allergies    Lisinopril    Review of Systems   Review of Systems  Physical Exam Updated Vital Signs Wt 100 kg   BMI 34.53 kg/m  Physical Exam  ED Results / Procedures / Treatments   Labs (all labs ordered are listed, but only abnormal results are displayed) Labs Reviewed  PROTIME-INR  APTT  CBC  DIFFERENTIAL  COMPREHENSIVE METABOLIC PANEL  ETHANOL  I-STAT CHEM 8, ED  CBG MONITORING, ED    EKG None  Radiology No results found.  Procedures Procedures  {Document cardiac monitor, telemetry assessment procedure when appropriate:1}  Medications Ordered in ED Medications  sodium chloride flush (NS) 0.9 % injection 3 mL (has no administration in time range)    ED Course/ Medical Decision Making/ A&P                           Medical Decision Making  ***  {Document critical care  time when appropriate:1} {Document review of labs and clinical decision tools ie heart score, Chads2Vasc2 etc:1}  {Document your independent review of radiology images, and any outside records:1} {Document your discussion with family members, caretakers, and with consultants:1} {Document social determinants of health affecting pt's care:1} {Document your decision making why or why not admission, treatments were needed:1} Final Clinical Impression(s) / ED Diagnoses Final diagnoses:  None    Rx / DC Orders ED Discharge Orders     None

## 2022-09-14 NOTE — ED Provider Notes (Signed)
I saw and evaluated the patient, reviewed the resident's note and I agree with the findings and plan.       Jay Shanks, MD 09/16/22 1016

## 2022-09-15 ENCOUNTER — Observation Stay (HOSPITAL_COMMUNITY): Payer: Medicare Other

## 2022-09-15 ENCOUNTER — Encounter (HOSPITAL_COMMUNITY): Payer: Self-pay | Admitting: Internal Medicine

## 2022-09-15 ENCOUNTER — Other Ambulatory Visit (HOSPITAL_COMMUNITY): Payer: Medicare Other

## 2022-09-15 ENCOUNTER — Other Ambulatory Visit: Payer: Self-pay

## 2022-09-15 DIAGNOSIS — I10 Essential (primary) hypertension: Secondary | ICD-10-CM

## 2022-09-15 DIAGNOSIS — R42 Dizziness and giddiness: Secondary | ICD-10-CM | POA: Diagnosis not present

## 2022-09-15 DIAGNOSIS — G459 Transient cerebral ischemic attack, unspecified: Secondary | ICD-10-CM | POA: Diagnosis present

## 2022-09-15 DIAGNOSIS — I63312 Cerebral infarction due to thrombosis of left middle cerebral artery: Secondary | ICD-10-CM

## 2022-09-15 DIAGNOSIS — R29818 Other symptoms and signs involving the nervous system: Secondary | ICD-10-CM | POA: Diagnosis not present

## 2022-09-15 DIAGNOSIS — I4729 Other ventricular tachycardia: Secondary | ICD-10-CM | POA: Diagnosis not present

## 2022-09-15 DIAGNOSIS — E119 Type 2 diabetes mellitus without complications: Secondary | ICD-10-CM

## 2022-09-15 DIAGNOSIS — R55 Syncope and collapse: Secondary | ICD-10-CM | POA: Diagnosis not present

## 2022-09-15 LAB — CBG MONITORING, ED
Glucose-Capillary: 136 mg/dL — ABNORMAL HIGH (ref 70–99)
Glucose-Capillary: 136 mg/dL — ABNORMAL HIGH (ref 70–99)
Glucose-Capillary: 157 mg/dL — ABNORMAL HIGH (ref 70–99)
Glucose-Capillary: 156 mg/dL — ABNORMAL HIGH (ref 70–99)

## 2022-09-15 LAB — GLUCOSE, CAPILLARY: Glucose-Capillary: 171 mg/dL — ABNORMAL HIGH (ref 70–99)

## 2022-09-15 MED ORDER — VITAMIN E 180 MG (400 UNIT) PO CAPS
400.0000 [IU] | ORAL_CAPSULE | Freq: Every day | ORAL | Status: DC
  Administered 2022-09-15 – 2022-09-16 (×2): 400 [IU] via ORAL
  Filled 2022-09-15 (×2): qty 1

## 2022-09-15 MED ORDER — INSULIN ASPART 100 UNIT/ML IJ SOLN
0.0000 [IU] | Freq: Three times a day (TID) | INTRAMUSCULAR | Status: DC
  Administered 2022-09-15 – 2022-09-16 (×4): 3 [IU] via SUBCUTANEOUS

## 2022-09-15 MED ORDER — ATORVASTATIN CALCIUM 80 MG PO TABS
80.0000 mg | ORAL_TABLET | Freq: Every day | ORAL | Status: DC
  Administered 2022-09-15 – 2022-09-16 (×2): 80 mg via ORAL
  Filled 2022-09-15 (×2): qty 1

## 2022-09-15 MED ORDER — ACETAMINOPHEN 160 MG/5ML PO SOLN: 650.0000 mg | ORAL | Status: DC | PRN

## 2022-09-15 MED ORDER — IOHEXOL 350 MG/ML SOLN
75.0000 mL | Freq: Once | INTRAVENOUS | Status: AC | PRN
  Administered 2022-09-15: 75 mL via INTRAVENOUS

## 2022-09-15 MED ORDER — ACETAMINOPHEN 325 MG PO TABS: 650.0000 mg | ORAL_TABLET | ORAL | Status: DC | PRN

## 2022-09-15 MED ORDER — BUPROPION HCL ER (XL) 150 MG PO TB24
150.0000 mg | ORAL_TABLET | Freq: Every day | ORAL | Status: DC
  Administered 2022-09-16: 150 mg via ORAL
  Filled 2022-09-15 (×2): qty 1

## 2022-09-15 MED ORDER — FOLIC ACID 1 MG PO TABS
500.0000 ug | ORAL_TABLET | Freq: Every day | ORAL | Status: DC
  Administered 2022-09-15 – 2022-09-16 (×2): 0.5 mg via ORAL
  Filled 2022-09-15 (×2): qty 1

## 2022-09-15 MED ORDER — STROKE: EARLY STAGES OF RECOVERY BOOK
Freq: Once | Status: AC
  Filled 2022-09-15: qty 1

## 2022-09-15 MED ORDER — LORATADINE 10 MG PO TABS
10.0000 mg | ORAL_TABLET | Freq: Every day | ORAL | Status: DC
  Administered 2022-09-16: 10 mg via ORAL
  Filled 2022-09-15: qty 1

## 2022-09-15 MED ORDER — ACETAMINOPHEN 650 MG RE SUPP: 650.0000 mg | RECTAL | Status: DC | PRN

## 2022-09-15 MED ORDER — ADULT MULTIVITAMIN W/MINERALS CH
1.0000 | ORAL_TABLET | Freq: Every day | ORAL | Status: DC
  Administered 2022-09-15 – 2022-09-16 (×2): 1 via ORAL
  Filled 2022-09-15 (×2): qty 1

## 2022-09-15 MED ORDER — PANTOPRAZOLE SODIUM 40 MG PO TBEC
40.0000 mg | DELAYED_RELEASE_TABLET | Freq: Every day | ORAL | Status: DC
  Administered 2022-09-15 – 2022-09-16 (×2): 40 mg via ORAL
  Filled 2022-09-15 (×2): qty 1

## 2022-09-15 MED ORDER — OXYBUTYNIN CHLORIDE ER 5 MG PO TB24
5.0000 mg | ORAL_TABLET | Freq: Every day | ORAL | Status: DC
  Administered 2022-09-15 – 2022-09-16 (×2): 5 mg via ORAL
  Filled 2022-09-15 (×2): qty 1

## 2022-09-15 MED ORDER — ENOXAPARIN SODIUM 40 MG/0.4ML IJ SOSY
40.0000 mg | PREFILLED_SYRINGE | INTRAMUSCULAR | Status: DC
  Administered 2022-09-15 – 2022-09-16 (×2): 40 mg via SUBCUTANEOUS
  Filled 2022-09-15 (×2): qty 0.4

## 2022-09-15 MED ORDER — INSULIN ASPART 100 UNIT/ML IJ SOLN: 0.0000 [IU] | Freq: Every day | INTRAMUSCULAR | Status: DC

## 2022-09-15 MED ORDER — FLUTICASONE PROPIONATE 50 MCG/ACT NA SUSP
2.0000 | Freq: Every day | NASAL | Status: DC
  Administered 2022-09-16: 2 via NASAL
  Filled 2022-09-15: qty 16

## 2022-09-15 NOTE — H&P (Signed)
History and Physical    Patient: Jay Bailey DZH:[id number] DOB: 1941/08/09 DOA: 09/14/2022 DOS: the patient was seen and examined on 09/15/2022 PCP: Harlan Stains, MD  Patient coming from: Home  Chief Complaint:  Chief Complaint  Patient presents with   Code Stroke   HPI: Jay Bailey is a 81 y.o. male with medical history significant of DM2, HTN, HLD.  Pt presents to ED with c/o stroke like symptoms: 6:30pm developed onset of slurred speech, L facial droop, L arm and leg weakness.  EMS called, EMS noted significant symptoms present on their arrival, occasional dysarthria as well.  POC CBG was nl.  En route to ED pt hypertensive.  Had episode of 4 beat run of NSVT associated with diaphoresis and "feeling funny".  In ED, symptoms are completely resolved.   Review of Systems: As mentioned in the history of present illness. All other systems reviewed and are negative. 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 He has never used smokeless tobacco. He reports that he does not  drink alcohol and does not use drugs.  Allergies  Allergen Reactions   Lisinopril Cough    Other reaction(s): cough     Family History  Problem Relation Age of Onset   Colon cancer Mother    Kidney cancer Mother    Heart disease Father    Lung cancer Brother    Pancreatic cancer Neg Hx    Prostate cancer Neg Hx    Breast cancer Neg Hx     Prior to Admission medications   Medication Sig Start Date End Date Taking? Authorizing Provider  amLODipine (NORVASC) 2.5 MG tablet Take 2.5 mg by mouth daily.   Yes [provider]  ascorbic acid (VITAMIN C) 1000 MG tablet Take 1,000 mg by mouth daily.   Yes [provider]  atorvastatin (LIPITOR) 80 MG tablet Take 80 mg by mouth daily.   Yes [provider]  benzonatate (TESSALON) 100 MG capsule Take 1 capsule (100 mg total) by mouth every 8 (eight) hours. 08/25/22  Yes Blue, Soijett A, PA-C  buPROPion (WELLBUTRIN XL) 150 MG 24 hr tablet Take 150 mg by mouth daily. 01/18/21  Yes [provider]  folic acid (FOLVITE) 419 MCG tablet Take 400 mcg by mouth daily.    Yes [provider]  irbesartan-hydrochlorothiazide (AVALIDE) 150-12.5 MG tablet Take 1 tablet by mouth daily. 08/31/20  Yes [provider]  metFORMIN (GLUCOPHAGE-XR) 500 MG 24 hr tablet Take 500 mg by mouth at bedtime. 11/27/18  Yes [provider]  Multiple Vitamin (MULTIVITAMIN) tablet Take 1 tablet by mouth daily.   Yes [provider]  ondansetron (ZOFRAN) 4 MG tablet Take 1 tablet (4 mg total) by mouth every 6 (six) hours as needed for nausea. 12/02/20  Yes Georgette Shell, MD  oxybutynin (DITROPAN-XL) 5 MG 24 hr tablet Take 5 mg by mouth daily. 10/31/20  Yes [provider]  pantoprazole (PROTONIX) 40 MG tablet Take 1 tablet (40 mg total) by mouth daily. 12/03/20  Yes Georgette Shell, MD  prochlorperazine (COMPAZINE) 10 MG tablet Take 1 tablet (10 mg total) by mouth every 6 (six) hours as needed  for nausea or vomiting. 11/16/20  Yes Tyler Pita, MD  vitamin E 400 UNIT capsule Take 400 Units by mouth daily.   Yes [provider]  ACCU-CHEK GUIDE test strip  10/15/20   [provider]    Physical Exam: Vitals:   09/14/22 2245 09/14/22 2315 09/14/22 2345 09/15/22 0000  BP: (!) 161/73 (!) 180/78 (!) 155/74 136/71  Pulse: (!) 55 65 (!) 55 (!) 57  Resp: '19 17 18 20  '$ Temp:      TempSrc:      SpO2: 97% 97% 96% 96%  Weight:       Constitutional: NAD, calm, comfortable Eyes: PERRL, lids and conjunctivae normal ENMT: Mucous membranes are moist. Posterior pharynx clear of any exudate or lesions.Normal dentition.  Neck: normal, supple, no masses, no thyromegaly Respiratory: clear to auscultation bilaterally, no wheezing, no crackles. Normal respiratory effort. No accessory muscle use.  Cardiovascular: Regular rate and rhythm, no murmurs / rubs / gallops. No extremity edema. 2+ pedal pulses. No carotid bruits.  Abdomen: no tenderness, no masses palpated. No hepatosplenomegaly. Bowel sounds positive.  Musculoskeletal: no clubbing / cyanosis. No joint deformity upper and lower extremities. Good ROM, no contractures. Normal muscle tone.  Skin: no rashes, lesions, ulcers. No induration Neurologic: CN 2-12 grossly intact. Sensation intact, DTR normal. Strength 5/5 in all 4.  Psychiatric: Normal judgment and insight. Alert and oriented x 3. Normal mood.   Data Reviewed:    CT head without acute findings  Mg 1.5     Latest Ref Rng & Units 09/14/2022    8:29 PM 09/14/2022    8:27 PM 12/02/2020    5:23 AM  CMP  Glucose 70 - 99 mg/dL 141  144  130   BUN 8 - 23 mg/dL '21  18  10   '$ Creatinine 0.61 - 1.24 mg/dL 1.00  1.05  0.83   Sodium 135 - 145 mmol/L 140  140  135   Potassium 3.5 - 5.1 mmol/L 3.8  3.8  3.8   Chloride 98 - 111 mmol/L 103  107  100   CO2 22 - 32 mmol/L  24  27   Calcium 8.9 - 10.3 mg/dL  9.7  8.2   Total Protein 6.5 - 8.1 g/dL  6.8    Total  Bilirubin 0.3 - 1.2 mg/dL  0.4    Alkaline Phos 38 - 126 U/L  89    AST 15 - 41 U/L  26    ALT 0 - 44 U/L  29       Assessment and Plan: * TIA (transient ischemic attack) Pt with TIA symptoms. Stroke  pathway Tele monitor MRI brain CTA head and neck 2d echo Cont home statin Hold BP meds and allow permissive HTN PT/OT/SLP Neuro consult  NSVT (nonsustained ventricular tachycardia) ([place]) Patient with run of NSVT with EMS en-route to hospital.  Had nausea with this. Tele monitor Replacing Mg and K First trop neg, second pending.  Diabetes mellitus without complication ([place]) Hold metformin, use SSI AC/HS  Essential hypertension Hold home BP meds and allow permissive HTN in setting of acute stroke.      Advance Care Planning:   Code Status: Full Code  Consults: Neurology  Family Communication: No family in room  Severity of Illness: The appropriate patient status for this patient is OBSERVATION. Observation status is judged to be reasonable and necessary in order to provide the required intensity of service to ensure the patient's safety. The patient's presenting symptoms, physical exam findings, and initial radiographic and laboratory data in the context of their medical condition is felt to place them at decreased risk for further clinical deterioration. Furthermore, it is anticipated that the patient will be medically stable for discharge from the hospital within 2 midnights of admission.   Author: Etta Quill., DO 09/15/2022 1:11 AM  For on call review [email or web address].

## 2022-09-15 NOTE — Assessment & Plan Note (Signed)
Patient with run of NSVT with EMS en-route to hospital.  Had nausea with this. 1. Tele monitor 2. Replacing Mg and K 3. First trop neg, second pending.

## 2022-09-15 NOTE — Assessment & Plan Note (Signed)
Hold metformin, use SSI AC/HS

## 2022-09-15 NOTE — Progress Notes (Addendum)
STROKE TEAM PROGRESS NOTE   INTERVAL HISTORY His sister-in-law is at the bedside.  Patient states that he recalls having trouble moving the left lower extremity in the ambulance last night but attributes this to the belt around his legs and denies true weakness. He states that he came in to the ED for presycope and flushed feeling in church yesterday. Reports from EMS with concern for left-sided weakness and left facial droop. Per sister-in-law at bedside this morning, patient's right mouth droop is new to her this morning and is not his baseline. Did not notice a left facial droop when she saw him in the ED yesterday.   There is right mouth droop on exam this morning as well as some mild dysarthria, though patient and family at bedside state that his speech is currently at baseline. There was some noted dysarthria per sister-in-law yesterday that has since resolved.   Patient complains of fullness in his ears on exam today.   Vitals:   09/15/22 0900 09/15/22 1030 09/15/22 1120 09/15/22 1130  BP: (!) 167/70 (!) 148/128  (!) 133/57  Pulse: (!) 54 62  61  Resp: '19 18  16  '$ Temp:   98.1 F (36.7 C)   TempSrc:   Oral   SpO2: 97% 95%  95%  Weight:       CBC:  Recent Labs  Lab 09/14/22 2027 09/14/22 2029  WBC 6.5  --   NEUTROABS 4.7  --   HGB 12.6* 13.6  HCT 37.6* 40.0  MCV 98.2  --   PLT 180  --    Basic Metabolic Panel:  Recent Labs  Lab 09/14/22 2027 09/14/22 2029  NA 140 140  K 3.8 3.8  CL 107 103  CO2 24  --   GLUCOSE 144* 141*  BUN 18 21  CREATININE 1.05 1.00  CALCIUM 9.7  --   MG 1.5*  --    Lipid Panel: No results for input(s): "CHOL", "TRIG", "HDL", "CHOLHDL", "VLDL", "LDLCALC" in the last 168 hours. HgbA1c: No results for input(s): "HGBA1C" in the last 168 hours. Urine Drug Screen: No results for input(s): "LABOPIA", "COCAINSCRNUR", "LABBENZ", "AMPHETMU", "THCU", "LABBARB" in the last 168 hours.  Alcohol Level  Recent Labs  Lab 09/14/22 2027  ETH <10     IMAGING past 24 hours MR BRAIN WO CONTRAST  Result Date: 09/15/2022 CLINICAL DATA:  Neuro deficit, acute, stroke suspected. Dizziness and near syncope. EXAM: MRI HEAD WITHOUT CONTRAST TECHNIQUE: Multiplanar, multiecho pulse sequences of the brain and surrounding structures were obtained without intravenous contrast. COMPARISON:  CT studies earlier same day FINDINGS: Brain: Diffusion imaging does not show any acute or subacute infarction. No abnormality affects the brainstem or cerebellum. Cerebral hemispheres show moderate chronic small-vessel ischemic changes of the white matter. No cortical or large vessel territory infarction. No mass lesion, hemorrhage, hydrocephalus or extra-axial collection. Vascular: Major vessels at the base of the brain show flow. Skull and upper cervical spine: Negative Sinuses/Orbits: Paranasal sinuses are clear.  Orbits are normal. Other: Extensive bilateral mastoid effusions in middle ear fluid left more than right that could possibly be symptomatic. IMPRESSION: 1. No acute brain finding. 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 2. Extensive bilateral mastoid effusions and middle ear fluid left more than right that could possibly be symptomatic. Electronically Signed   By: Nelson Chimes M.D.   On: 09/15/2022 08:49   CT ANGIO HEAD NECK W WO CM  Result Date: 09/15/2022 CLINICAL DATA:  Acute  neurologic deficit EXAM: CT ANGIOGRAPHY HEAD AND NECK TECHNIQUE: Multidetector CT imaging of the head and neck was performed using the standard protocol during bolus administration of intravenous contrast. Multiplanar CT image reconstructions and MIPs were obtained to evaluate the vascular anatomy. Carotid stenosis measurements (when applicable) are obtained utilizing NASCET criteria, using the distal internal carotid diameter as the denominator. RADIATION DOSE REDUCTION: This exam was performed according to the departmental  dose-optimization program which includes automated exposure control, adjustment of the mA and/or kV according to patient size and/or use of iterative reconstruction technique. CONTRAST:  19m OMNIPAQUE IOHEXOL 350 MG/ML SOLN COMPARISON:  None Available. FINDINGS: CTA NECK FINDINGS SKELETON: There is no bony spinal canal stenosis. No lytic or blastic lesion. OTHER NECK: Normal pharynx, larynx and major salivary glands. No cervical lymphadenopathy. Unremarkable thyroid gland. UPPER CHEST: Incidental right azygous fissure AORTIC ARCH: There is no calcific atherosclerosis of the aortic arch. There is no aneurysm, dissection or hemodynamically significant stenosis of the visualized portion of the aorta. Conventional 3 vessel aortic branching pattern. The visualized proximal subclavian arteries are widely patent. RIGHT CAROTID SYSTEM: Normal without aneurysm, dissection or stenosis. LEFT CAROTID SYSTEM: Normal without aneurysm, dissection or stenosis. VERTEBRAL ARTERIES: Left dominant configuration. Both origins are clearly patent. There is no dissection, occlusion or flow-limiting stenosis to the skull base (V1-V3 segments). CTA HEAD FINDINGS POSTERIOR CIRCULATION: --Vertebral arteries: Normal V4 segments. --Inferior cerebellar arteries: Normal. --Basilar artery: Normal. --Superior cerebellar arteries: Normal. --Posterior cerebral arteries (PCA): Normal. ANTERIOR CIRCULATION: --Intracranial internal carotid arteries: Normal. --Anterior cerebral arteries (ACA): Normal. Both A1 segments are present. Patent anterior communicating artery (a-comm). --Middle cerebral arteries (MCA): Normal. VENOUS SINUSES: As permitted by contrast timing, patent. ANATOMIC VARIANTS: None Review of the MIP images confirms the above findings. IMPRESSION: No emergent large vessel occlusion or high-grade stenosis of the intracranial or cervical arteries. Electronically Signed   By: KUlyses JarredM.D.   On: 09/15/2022 03:36   DG CHEST PORT 1  VIEW  Result Date: 09/14/2022 CLINICAL DATA:  History of bradycardia presented with left leg weakness and left-sided facial droop. EXAM: PORTABLE CHEST 1 VIEW COMPARISON:  August 25, 2022 FINDINGS: The heart size and mediastinal contours are within normal limits. Low lung volumes are noted. Mild atelectasis is seen within the left lung base. There is no evidence of a pleural effusion or pneumothorax. Multilevel degenerative changes are seen throughout the thoracic spine. IMPRESSION: Low lung volumes with mild left basilar atelectasis. Electronically Signed   By: TVirgina NorfolkM.D.   On: 09/14/2022 21:30   CT HEAD CODE STROKE WO CONTRAST  Result Date: 09/14/2022 CLINICAL DATA:  Code stroke.  Left-sided deficits. EXAM: CT HEAD WITHOUT CONTRAST TECHNIQUE: Contiguous axial images were obtained from the base of the skull through the vertex without intravenous contrast. RADIATION DOSE REDUCTION: This exam was performed according to the departmental dose-optimization program which includes automated exposure control, adjustment of the mA and/or kV according to patient size and/or use of iterative reconstruction technique. COMPARISON:  None Available. FINDINGS: Brain: There is no acute intracranial hemorrhage, extra-axial fluid collection, or acute infarct. There is mild parenchymal volume loss. The ventricles are normal in size. Gray-white differentiation is preserved. Patchy hypodensity in the supratentorial white matter likely reflects sequela of mild chronic small vessel ischemic change. There is no mass lesion.  There is no mass effect or midline shift. Vascular: No hyperdense vessel or unexpected calcification. Skull: Normal. Negative for fracture or focal lesion. Sinuses/Orbits: The imaged paranasal sinuses are clear. The  imaged globes and orbits are unremarkable. Other: There is a left mastoid effusion. The partially imaged left nasopharynx is unremarkable. ASPECTS [facility] Stroke Program Early CT  Score) - Ganglionic level infarction (caudate, lentiform nuclei, internal capsule, insula, M1-M3 cortex): 7 - Supraganglionic infarction (M4-M6 cortex): 3 Total score (0-10 with 10 being normal): 10 IMPRESSION: 1. No acute intracranial pathology. ASPECTS is 10 2. Left mastoid effusion. These results were paged via [employer]ION at the time of interpretation on 09/14/2022 at 8:37 pm to provider Alc[place]. Electronically Signed   By: Valetta Mole M.D.   On: 09/14/2022 20:39    PHYSICAL EXAM  Physical Exam  Constitutional: Appears well-developed and well-nourished.  Psych: Affect appropriate to situation, calm and cooperative with exam  Eyes: No scleral injection HENT: No OP obstrucion MSK: no joint deformities.  Cardiovascular: Normal rate and regular rhythm.  Respiratory: Effort normal, non-labored breathing GI: Soft.  No distension. There is no tenderness.  Skin: WDI  Neuro: Mental Status: Patient is awake, alert, oriented to person, place, month, year, and situation. Patient is able to give a clear and coherent history. No signs of aphasia or neglect. There is mild dysarthria on exam, though family and patient states that his speech is at baseline.  Fluency and comprehension are intact but patient does have trouble with repetition.  Cranial Nerves: II: Visual Fields are full. Pupils are equal, round, and reactive to light.  III,IV, VI: EOMI  V: Facial sensation is symmetric to light touch  VII: Facial movement is symmetric resting and and with movement  VIII: Hearing is intact to voice with hearing aids in place  X: Palate elevates symmetrically XI: Shoulder shrug is symmetric. XII: Tongue protrudes midline without atrophy or fasciculations.  Motor: Tone is normal. Bulk is normal. 5/5 strength was present in all four extremities without vertical drift or asymmetry.  Sensory: Sensation is symmetric to light touch and temperature in the arms and legs.  Cerebellar: FNF and HKS are intact  bilaterally  ASSESSMENT/PLAN [name] is a 81 y.o. male with history of DM2, HTN, HLD, prostate cancer, amputation of the left index finger, depression, and melanoma presenting with sudden onset of left-sided weakness with resolution of symptoms prior to hospital arrival. Of note, the patient missed his morning antihypertensive medications and was significantly hypertensive with EMS with a systolic blood pressure of 210 mmHg.    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 Reports of deficits yesterday PTA included left-sided weakness and left facial asymmetry that resolved PTA. Current deficits on exam include right facial droop.  Code Stroke CTH without acute intracranial pathology, ASPECTS 10.  CTA head & neck no LVO or high-grade stenosis of the intracranial or cervical arteries.  MRI  without acute intracranial abnormalities.  2D Echo pending LDL pending HgbA1c pending VTE prophylaxis -Lovenox No antithrombotic prior to admission, now on aspirin 81 mg daily and clopidogrel 75 mg daily DAPT for 3 weeks and then aspirin alone. Therapy recommendations:  Home health PT Disposition:  Pending   Hypertension Home meds:  Norvasc, Avalide  Unstable on the high end Gradually normalize BP in 2 to 3 days Blood pressure goal normotensive  Hyperlipidemia Home meds:  atorvastatin 80 mg PO LDL pending, goal < 70 Continue Lipitor 80 Continue statin at discharge  Diabetes type II Controlled Home meds:  Metformin  HgbA1c pending, goal < 7.0 CBGs SSI Close PCP follow-up for better DM control  Other Stroke Risk Factors Advanced Age >/= 23  Obesity, Body mass index is 34.53 kg/m., BMI >/= 30 associated with increased stroke risk, recommend weight loss, diet and exercise as appropriate   Other Active Problems Recent viral/bacterial upper respiratory infection  Prostate cancer Status post left index finger amputation [facility] day #  0  -- Anibal Henderson, AGACNP-BC [facility] [phone]  ATTENDING NOTE: I reviewed above note and agree with the assessment and plan. Pt was seen and examined.   81 year old male with history of hypertension, hyperlipidemia, diabetes, prostate cancer, left index finger amputation admitted for feeling of passing out, confusion and questionable left facial droop and left-sided weakness.  BP significant elevated on presentation.  CT no acute abnormality.  CT head neck unremarkable.  MRI negative for acute stroke.  2D echo pending, A1c and LDL pending.  Creatinine 1.00.  On exam, neurologically intact except right facial droop.  Patient and sister-in-law denies slurred speech.  Etiology for patient current symptoms concerning for stroke, likely DWI negative stroke due to small vessel disease.  Currently on DAPT, recommend DAPT for 3 weeks and then aspirin alone.  Continue home Lipitor 80.  Risk factor modification.  PT/OT recommend home health.  We will follow.  For detailed assessment and plan, please refer to above/below as I have made changes wherever appropriate.   Rosalin Hawking, MD PhD Stroke Neurology 09/15/2022 5:35 PM    To contact Stroke Continuity provider, please refer to [email or web address] After hours, contact General Neurology

## 2022-09-15 NOTE — Care Management Obs Status (Signed)
[place] NOTIFICATION   Patient Details  Name: Jay Bailey MRN: [id number] Date of Birth: Nov 27, 1941   Medicare Observation Status Notification Given:  Yes    Verdell Carmine, RN 09/15/2022, 4:26 PM

## 2022-09-15 NOTE — Progress Notes (Signed)
Physical Therapy Evaluation Patient Details Name: Jay Bailey MRN: [id number] DOB: Nov 19, 1941 Today's Date: 09/15/2022  History of Present Illness  81 yo male with onset of stroke like symptoms was admitted on 10/13, noted L side weakness and dysarthria.  Had NSVT on the trip to ED which was symptomatic.  MD felt symptoms resolved in ED. Dx of TIA.   PMHx:  B THA's, DM, THN, HLD, L index amputation, bradycardia, depression, inguinal hernia, B hearing aids, skin CA, prostate CA,  Clinical Impression  Pt was seen for mobility on the RW for gait and balance skills, and note his strength is mildly reduced along with endurance.  Pt is expected to go back to [place] for HHPT to follow up with him and recover strength and safety to walk.  Pt is motivated to work but has some clear concerns about how he is progressing.  His sister in law walked in and is following up with MD, has family support to safely manage.  Focus on goals for acute PT as are outlined below.     Recommendations for follow up therapy are one component of a multi-disciplinary discharge planning process, led by the attending physician.  Recommendations may be updated based on patient status, additional functional criteria and insurance authorization.  Follow Up Recommendations Home health PT      Assistance Recommended at Discharge Set up Supervision/Assistance  Patient can return home with the following  A little help with walking and/or transfers;A little help with bathing/dressing/bathroom;Assistance with cooking/housework;Help with stairs or ramp for entrance;Direct supervision/assist for medications management;Direct supervision/assist for financial management;Assist for transportation    Equipment Recommendations Rolling walker (2 wheels)  Recommendations for Other Services       Functional Status Assessment Patient has had a recent decline in their functional status and demonstrates the ability to make significant  improvements in function in a reasonable and predictable amount of time.     Precautions / Restrictions Precautions Precautions: Fall Precaution Comments: HOH Restrictions Weight Bearing Restrictions: No      Mobility  Bed Mobility Overal bed mobility: Needs Assistance Bed Mobility: Supine to Sit, Sit to Supine     Supine to sit: Min assist Sit to supine: Min assist   General bed mobility comments: repositioning on bed    Transfers Overall transfer level: Needs assistance Equipment used: Rolling walker (2 wheels), 1 person hand held assist Transfers: Sit to/from Stand Sit to Stand: Min guard                Ambulation/Gait Ambulation/Gait assistance: Min guard Gait Distance (Feet): 70 Feet Assistive device: Rolling walker (2 wheels), 1 person hand held assist Gait Pattern/deviations: Step-through pattern, Decreased stride length, Wide base of support Gait velocity: reduced Gait velocity interpretation: <1.31 ft/sec, indicative of household ambulator   General Gait Details: used RW for lateral and anterior support, good effort to maneuver carefully to turn around back to room  Stairs            Wheelchair Mobility    Modified Rankin (Stroke Patients Only) Modified Rankin (Stroke Patients Only) Pre-Morbid Rankin Score: No significant disability Modified Rankin: Slight disability     Balance Overall balance assessment: Needs assistance Sitting-balance support: Feet supported Sitting balance-Leahy Scale: Good     Standing balance support: Bilateral upper extremity supported, During functional activity Standing balance-Leahy Scale: Fair Standing balance comment: less than fair dynamically  Pertinent Vitals/Pain Pain Assessment Pain Assessment: No/denies pain    Home Living Family/patient expects to be discharged to:: Assisted living                 Home Equipment: Rollator (4 wheels);Cane - single  point;Grab bars - tub/shower Additional Comments: resident of [place] at [facility]    Prior Function Prior Level of Function : Needs assist       Physical Assist : Mobility (physical) Mobility (physical): Gait   Mobility Comments: rollator typically or SPC       Hand Dominance   Dominant Hand: Right    Extremity/Trunk Assessment   Upper Extremity Assessment Upper Extremity Assessment: Generalized weakness    Lower Extremity Assessment Lower Extremity Assessment: Generalized weakness    Cervical / Trunk Assessment Cervical / Trunk Assessment: Kyphotic  Communication   Communication: HOH  Cognition Arousal/Alertness: Awake/alert Behavior During Therapy: Anxious Overall Cognitive Status: No family/caregiver present to determine baseline cognitive functioning                                 General Comments: Pt has anxiety about having his room door open, about whether his family has been  contacted, about PLOF        General Comments General comments (skin integrity, edema, etc.): Pt was assisted to get up to side of bed and walk, then was able to assist back to awkward setup on bed    Exercises     Assessment/Plan    PT Assessment Patient needs continued PT services  PT Problem List Decreased activity tolerance;Decreased balance;Decreased mobility;Decreased knowledge of use of DME       PT Treatment Interventions DME instruction;Gait training;Functional mobility training;Therapeutic activities;Therapeutic exercise;Balance training;Neuromuscular re-education;Patient/family education    PT Goals (Current goals can be found in the Care Plan section)  Acute Rehab PT Goals Patient Stated Goal: to get better and get home PT Goal Formulation: With patient/family Time For Goal Achievement: 09/29/22 Potential to Achieve Goals: Good    Frequency Min 3X/week     Co-evaluation               AM-PAC PT "6 Clicks" Mobility  Outcome Measure Help  needed turning from your back to your side while in a flat bed without using bedrails?: A Little Help needed moving from lying on your back to sitting on the side of a flat bed without using bedrails?: A Little Help needed moving to and from a bed to a chair (including a wheelchair)?: A Little Help needed standing up from a chair using your arms (e.g., wheelchair or bedside chair)?: A Little Help needed to walk in hospital room?: A Little Help needed climbing 3-5 steps with a railing? : A Lot 6 Click Score: 17    End of Session Equipment Utilized During Treatment: Gait belt Activity Tolerance: Patient limited by fatigue Patient left: in bed;with call bell/phone within reach;with family/visitor present Nurse Communication: Mobility status PT Visit Diagnosis: Unsteadiness on feet (R26.81);Muscle weakness (generalized) (M62.81);Difficulty in walking, not elsewhere classified (R26.2)    Time: 6759-1638 PT Time Calculation (min) (ACUTE ONLY): 25 min   Charges:   PT Evaluation $PT Eval Moderate Complexity: 1 Mod PT Treatments $Gait Training: 8-22 mins       Ramond Dial 09/15/2022, 1:25 PM  Mee Hives, PT PhD Acute Rehab Dept. Number: [place] [facility] [place]

## 2022-09-15 NOTE — Assessment & Plan Note (Signed)
Hold home BP meds and allow permissive HTN in setting of acute stroke. 

## 2022-09-15 NOTE — Progress Notes (Signed)
PROGRESS NOTE    Jay Bailey  WUX:[id number] DOB: 02-May-1941 DOA: 09/14/2022 PCP: Harlan Stains, MD  Outpatient Specialists:     Brief Narrative:  As per H&P done earlier today: "Jay Bailey is a 81 y.o. male with medical history significant of DM2, HTN, HLD.   Pt presents to ED with c/o stroke like symptoms: 6:30pm developed onset of slurred speech, L facial droop, L arm and leg weakness.   EMS called, EMS noted significant symptoms present on their arrival, occasional dysarthria as well.  POC CBG was nl.   En route to ED pt hypertensive.  Had episode of 4 beat run of NSVT associated with diaphoresis and "feeling funny".   In ED, symptoms are completely resolved".  09/15/2022: Patient seen.  Discussed with the neurology team, Dr. Erlinda Hong.  Work-up is in progress.  Patient is currently on aspirin and Plavix.  Updated patient's healthcare power of attorney, Darrick Penna (phone [phone]).  MRI findings noted.  We will start patient on Flonase and loratadine.  Patient will follow up with ENT on discharge.  Patient will also follow-up with neurology on discharge.  No new symptoms endorsed.  Patient and patient's healthcare power of attorney and not keen on patient being discharged home today.  They want all work-up reported and documented on the patient's medical record prior to discharge.  Likely, patient be discharged back on tomorrow.   Assessment & Plan:   Principal Problem:   TIA (transient ischemic attack) Active Problems:   Essential hypertension   Diabetes mellitus without complication (HCC)   NSVT (nonsustained ventricular tachycardia) ([place])     Consultants:  Neurology.  Procedures:  None  Antimicrobials:  None   Subjective: Most of the neuro symptoms have resolved.  Facial droop persists.  Objective: Vitals:   09/15/22 1120 09/15/22 1130 09/15/22 1230 09/15/22 1400  BP:  (!) 133/57 (!) 148/84 (!) 134/91  Pulse:  61 (!) 57 (!) 58  Resp:  16 20 (!) 22  Temp:  98.1 F (36.7 C)     TempSrc: Oral     SpO2:  95% 96% 97%  Weight:       No intake or output data in the 24 hours ending 09/15/22 1515 Filed Weights   09/14/22 2000  Weight: 100 kg    Examination:  General exam: Appears calm and comfortable  Respiratory system: Clear to auscultation.  Cardiovascular system: S1 & S2 heard,  Data Reviewed: I have personally reviewed following labs and imaging studies  CBC: Recent Labs  Lab 09/14/22 2027 09/14/22 2029  WBC 6.5  --   NEUTROABS 4.7  --   HGB 12.6* 13.6  HCT 37.6* 40.0  MCV 98.2  --   PLT 180  --    Basic Metabolic Panel: Recent Labs  Lab 09/14/22 2027 09/14/22 2029  NA 140 140  K 3.8 3.8  CL 107 103  CO2 24  --   GLUCOSE 144* 141*  BUN 18 21  CREATININE 1.05 1.00  CALCIUM 9.7  --   MG 1.5*  --    GFR: Estimated Creatinine Clearance: 67.5 mL/min (by C-G formula based on SCr of 1 mg/dL). Liver Function Tests: Recent Labs  Lab 09/14/22 2027  AST 26  ALT 29  ALKPHOS 89  BILITOT 0.4  PROT 6.8  ALBUMIN 4.0   No results for input(s): "LIPASE", "AMYLASE" in the last 168 hours. No results for input(s): "AMMONIA" in the last 168 hours. Coagulation Profile: Recent Labs  Lab 09/14/22 2027  INR 1.0   Cardiac Enzymes: No results for input(s): "CKTOTAL", "CKMB", "CKMBINDEX", "TROPONINI" in the last 168 hours. BNP (last 3 results) No results for input(s): "PROBNP" in the last 8760 hours. HbA1C: No results for input(s): "HGBA1C" in the last 72 hours. CBG: Recent Labs  Lab 09/14/22 2026 09/15/22 0244 09/15/22 0850 09/15/22 1118  GLUCAP 146* 136* 136* 157*   Lipid Profile: No results for input(s): "CHOL", "HDL", "LDLCALC", "TRIG", "CHOLHDL", "LDLDIRECT" in the last 72 hours. Thyroid Function Tests: No results for input(s): "TSH", "T4TOTAL", "FREET4", "T3FREE", "THYROIDAB" in the last 72 hours. Anemia Panel: No results for input(s): "VITAMINB12", "FOLATE", "FERRITIN", "TIBC", "IRON", "RETICCTPCT" in the  last 72 hours. Urine analysis:    Component Value Date/Time   COLORURINE YELLOW 11/27/2020 1540   APPEARANCEUR HAZY (A) 11/27/2020 1540   LABSPEC 1.026 11/27/2020 1540   PHURINE 5.0 11/27/2020 1540   GLUCOSEU NEGATIVE 11/27/2020 1540   HGBUR SMALL (A) 11/27/2020 1540   BILIRUBINUR NEGATIVE 11/27/2020 1540   KETONESUR 5 (A) 11/27/2020 1540   PROTEINUR 100 (A) 11/27/2020 1540   UROBILINOGEN 0.2 06/17/2008 1000   NITRITE NEGATIVE 11/27/2020 1540   LEUKOCYTESUR NEGATIVE 11/27/2020 1540   Sepsis Labs: '@LABRCNTIP'$ (procalcitonin:4,lacticidven:4)  )No results found for this or any previous visit (from the past 240 hour(s)).       Radiology Studies: MR BRAIN WO CONTRAST  Result Date: 09/15/2022 CLINICAL DATA:  Neuro deficit, acute, stroke suspected. Dizziness and near syncope. EXAM: MRI HEAD WITHOUT CONTRAST TECHNIQUE: Multiplanar, multiecho pulse sequences of the brain and surrounding structures were obtained without intravenous contrast. COMPARISON:  CT studies earlier same day FINDINGS: Brain: Diffusion imaging does not show any acute or subacute infarction. No abnormality affects the brainstem or cerebellum. Cerebral hemispheres show moderate chronic small-vessel ischemic changes of the white matter. No cortical or large vessel territory infarction. No mass lesion, hemorrhage, hydrocephalus or extra-axial collection. Vascular: Major vessels at the base of the brain show flow. Skull and upper cervical spine: Negative Sinuses/Orbits: Paranasal sinuses are clear.  Orbits are normal. Other: Extensive bilateral mastoid effusions in middle ear fluid left more than right that could possibly be symptomatic. IMPRESSION: 1. No acute brain finding. 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 2. Extensive bilateral mastoid effusions and middle ear fluid left more than right that could possibly be symptomatic. Electronically Signed   By: Nelson Chimes M.D.   On:  09/15/2022 08:49   CT ANGIO HEAD NECK W WO CM  Result Date: 09/15/2022 CLINICAL DATA:  Acute neurologic deficit EXAM: CT ANGIOGRAPHY HEAD AND NECK TECHNIQUE: Multidetector CT imaging of the head and neck was performed using the standard protocol during bolus administration of intravenous contrast. Multiplanar CT image reconstructions and MIPs were obtained to evaluate the vascular anatomy. Carotid stenosis measurements (when applicable) are obtained utilizing NASCET criteria, using the distal internal carotid diameter as the denominator. RADIATION DOSE REDUCTION: This exam was performed according to the departmental dose-optimization program which includes automated exposure control, adjustment of the mA and/or kV according to patient size and/or use of iterative reconstruction technique. CONTRAST:  25m OMNIPAQUE IOHEXOL 350 MG/ML SOLN COMPARISON:  None Available. FINDINGS: CTA NECK FINDINGS SKELETON: There is no bony spinal canal stenosis. No lytic or blastic lesion. OTHER NECK: Normal pharynx, larynx and major salivary glands. No cervical lymphadenopathy. Unremarkable thyroid gland. UPPER CHEST: Incidental right azygous fissure AORTIC ARCH: There is no calcific atherosclerosis of the aortic arch. There is no aneurysm, dissection or hemodynamically significant stenosis of  the visualized portion of the aorta. Conventional 3 vessel aortic branching pattern. The visualized proximal subclavian arteries are widely patent. RIGHT CAROTID SYSTEM: Normal without aneurysm, dissection or stenosis. LEFT CAROTID SYSTEM: Normal without aneurysm, dissection or stenosis. VERTEBRAL ARTERIES: Left dominant configuration. Both origins are clearly patent. There is no dissection, occlusion or flow-limiting stenosis to the skull base (V1-V3 segments). CTA HEAD FINDINGS POSTERIOR CIRCULATION: --Vertebral arteries: Normal V4 segments. --Inferior cerebellar arteries: Normal. --Basilar artery: Normal. --Superior cerebellar arteries:  Normal. --Posterior cerebral arteries (PCA): Normal. ANTERIOR CIRCULATION: --Intracranial internal carotid arteries: Normal. --Anterior cerebral arteries (ACA): Normal. Both A1 segments are present. Patent anterior communicating artery (a-comm). --Middle cerebral arteries (MCA): Normal. VENOUS SINUSES: As permitted by contrast timing, patent. ANATOMIC VARIANTS: None Review of the MIP images confirms the above findings. IMPRESSION: No emergent large vessel occlusion or high-grade stenosis of the intracranial or cervical arteries. Electronically Signed   By: Ulyses Jarred M.D.   On: 09/15/2022 03:36   DG CHEST PORT 1 VIEW  Result Date: 09/14/2022 CLINICAL DATA:  History of bradycardia presented with left leg weakness and left-sided facial droop. EXAM: PORTABLE CHEST 1 VIEW COMPARISON:  August 25, 2022 FINDINGS: The heart size and mediastinal contours are within normal limits. Low lung volumes are noted. Mild atelectasis is seen within the left lung base. There is no evidence of a pleural effusion or pneumothorax. Multilevel degenerative changes are seen throughout the thoracic spine. IMPRESSION: Low lung volumes with mild left basilar atelectasis. Electronically Signed   By: Virgina Norfolk M.D.   On: 09/14/2022 21:30   CT HEAD CODE STROKE WO CONTRAST  Result Date: 09/14/2022 CLINICAL DATA:  Code stroke.  Left-sided deficits. EXAM: CT HEAD WITHOUT CONTRAST TECHNIQUE: Contiguous axial images were obtained from the base of the skull through the vertex without intravenous contrast. RADIATION DOSE REDUCTION: This exam was performed according to the departmental dose-optimization program which includes automated exposure control, adjustment of the mA and/or kV according to patient size and/or use of iterative reconstruction technique. COMPARISON:  None Available. FINDINGS: Brain: There is no acute intracranial hemorrhage, extra-axial fluid collection, or acute infarct. There is mild parenchymal volume loss.  The ventricles are normal in size. Gray-white differentiation is preserved. Patchy hypodensity in the supratentorial white matter likely reflects sequela of mild chronic small vessel ischemic change. There is no mass lesion.  There is no mass effect or midline shift. Vascular: No hyperdense vessel or unexpected calcification. Skull: Normal. Negative for fracture or focal lesion. Sinuses/Orbits: The imaged paranasal sinuses are clear. The imaged globes and orbits are unremarkable. Other: There is a left mastoid effusion. The partially imaged left nasopharynx is unremarkable. ASPECTS [facility] Stroke Program Early CT Score) - Ganglionic level infarction (caudate, lentiform nuclei, internal capsule, insula, M1-M3 cortex): 7 - Supraganglionic infarction (M4-M6 cortex): 3 Total score (0-10 with 10 being normal): 10 IMPRESSION: 1. No acute intracranial pathology. ASPECTS is 10 2. Left mastoid effusion. These results were paged via AMION at the time of interpretation on 09/14/2022 at 8:37 pm to provider [place]. Electronically Signed   By: Valetta Mole M.D.   On: 09/14/2022 20:39        Scheduled Meds:  [START ON 09/16/2022]  stroke: early stages of recovery book   Does not apply Once   aspirin  81 mg Oral Daily   Or   aspirin  300 mg Rectal Daily   atorvastatin  80 mg Oral Daily   buPROPion  150 mg Oral Daily   clopidogrel  75  mg Oral Daily   enoxaparin (LOVENOX) injection  40 mg Subcutaneous Q24H   fluticasone  2 spray Each Nare Daily   folic acid  012 mcg Oral Daily   insulin aspart  0-15 Units Subcutaneous TID WC   insulin aspart  0-5 Units Subcutaneous QHS   loratadine  10 mg Oral Daily   multivitamin with minerals  1 tablet Oral Daily   oxybutynin  5 mg Oral Daily   pantoprazole  40 mg Oral Daily   vitamin E  400 Units Oral Daily   Continuous Infusions:   LOS: 0 days        Dana Allan, MD  [facility] Pager #: [phone] 7PM-7AM contact night coverage as  above

## 2022-09-15 NOTE — TOC Initial Note (Addendum)
Transition of Care [facility]) - Initial/Assessment Note    Patient Details  Name: Jay Bailey MRN: [id number] Date of Birth: 08-30-1941  [facility] CM/SW Contact:    Verdell Carmine, RN Phone Number: 09/15/2022, 4:07 PM  Clinical Narrative:                  81 year old patient from [facility] independent [facility]. He was functional with cooking, walking with a cane and a 4 wheeled walker with seat.  Discussed home health and DME for DC planning. He states he has all the DME needed, He would like Home Health. They go through [employer]. Will just need to place face to face, MD messaged. He is interested in going back to diabtes management and loosing more weight.  He would like for this RNCM to talk to his SIL, who should be here shortly. She is his MPOA as well.  Will return to room  if time permits when SIL here to discuss with her as well as discuss OBS status.   Expected to DC in AM Expected Discharge Plan: [place] Barriers to Discharge: Continued Medical Work up   Patient Goals and CMS Choice     Choice offered to / list presented to : Patient  Expected Discharge Plan and Services Expected Discharge Plan: [place]   Discharge Planning Services: CM Consult Post Acute Care Choice: [place] arrangements for the past 2 months: [place] Arranged: PT          Prior Living Arrangements/Services Living arrangements for the past 2 months: [place] Lives with:: Self Patient language and need for interpreter reviewed:: Yes Do you feel safe going back to the place where you live?: Yes      Need for Family Participation in Patient Care: Yes (Comment) Care giver support system in place?: Yes (comment)   Criminal Activity/Legal Involvement Pertinent to Current Situation/Hospitalization: No - Comment as needed  Activities of Daily Living       Permission Sought/Granted                  Emotional Assessment   Attitude/Demeanor/Rapport: Guarded Affect (typically observed): Overwhelmed Orientation: : Oriented to Self, Oriented to Place, Oriented to Situation Alcohol / Substance Use: Not Applicable Psych Involvement: No (comment)  Admission diagnosis:  TIA (transient ischemic attack) [G45.9] Patient Active Problem List   Diagnosis Date Noted   TIA (transient ischemic attack) 09/15/2022   NSVT (nonsustained ventricular tachycardia) ([place]) 09/15/2022   Small bowel obstruction (HCC)    Nausea vomiting and diarrhea    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   Diabetes mellitus without complication ([place]) 81/44/8185   Left ureteral stone 03/04/2020   Bradycardia 02/10/2019   Essential hypertension 02/10/2019   CP (cerebral palsy) ([place]) 12/29/2018   Adult failure to thrive 12/27/2018   Recurrent left inguinal hernia 12/26/2018   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

## 2022-09-15 NOTE — Assessment & Plan Note (Addendum)
Pt with TIA symptoms. 1. Stroke pathway 2. Tele monitor 3. MRI brain 4. CTA head and neck 5. 2d echo 6. Cont home statin 7. Hold BP meds and allow permissive HTN 8. PT/OT/SLP 9. Neuro consult

## 2022-09-15 NOTE — ED Notes (Signed)
Pt ambulated to the RR.  

## 2022-09-16 ENCOUNTER — Observation Stay (HOSPITAL_BASED_OUTPATIENT_CLINIC_OR_DEPARTMENT_OTHER): Payer: Medicare Other

## 2022-09-16 DIAGNOSIS — G459 Transient cerebral ischemic attack, unspecified: Secondary | ICD-10-CM

## 2022-09-16 DIAGNOSIS — I10 Essential (primary) hypertension: Secondary | ICD-10-CM | POA: Diagnosis not present

## 2022-09-16 DIAGNOSIS — E119 Type 2 diabetes mellitus without complications: Secondary | ICD-10-CM | POA: Diagnosis not present

## 2022-09-16 LAB — ECHOCARDIOGRAM COMPLETE BUBBLE STUDY
AR max vel: 2.96 cm2
AV Area VTI: 2.83 cm2
AV Area mean vel: 2.58 cm2
AV Mean grad: 4 mmHg
AV Peak grad: 8.1 mmHg
Ao pk vel: 1.42 m/s
Area-P 1/2: 1.84 cm2
S' Lateral: 3.7 cm

## 2022-09-16 LAB — GLUCOSE, CAPILLARY
Glucose-Capillary: 161 mg/dL — ABNORMAL HIGH (ref 70–99)
Glucose-Capillary: 123 mg/dL — ABNORMAL HIGH (ref 70–99)
Glucose-Capillary: 180 mg/dL — ABNORMAL HIGH (ref 70–99)
Glucose-Capillary: 109 mg/dL — ABNORMAL HIGH (ref 70–99)

## 2022-09-16 LAB — LIPID PANEL
Cholesterol: 139 mg/dL (ref 0–200)
HDL: 36 mg/dL — ABNORMAL LOW (ref >40)
LDL Cholesterol: 59 mg/dL (ref 0–99)
Total CHOL/HDL Ratio: 3.9 RATIO
Triglycerides: 218 mg/dL — ABNORMAL HIGH (ref <150)
VLDL: 44 mg/dL — ABNORMAL HIGH (ref 0–40)

## 2022-09-16 LAB — HEMOGLOBIN A1C
Hgb A1c MFr Bld: 7.6 % — ABNORMAL HIGH (ref 4.8–5.6)
Mean Plasma Glucose: 171.42 mg/dL

## 2022-09-16 MED ORDER — CLOPIDOGREL BISULFATE 75 MG PO TABS: 75.0000 mg | ORAL_TABLET | Freq: Every day | ORAL | 0 refills | Status: AC

## 2022-09-16 MED ORDER — FLUTICASONE PROPIONATE 50 MCG/ACT NA SUSP: 2.0000 | Freq: Every day | NASAL | 2 refills | Status: DC

## 2022-09-16 MED ORDER — LORATADINE 10 MG PO TABS: 10.0000 mg | ORAL_TABLET | Freq: Every day | ORAL | 0 refills | Status: DC

## 2022-09-16 MED ORDER — ASPIRIN 81 MG PO CHEW: 81.0000 mg | CHEWABLE_TABLET | Freq: Every day | ORAL | 2 refills | Status: AC

## 2022-09-16 MED ORDER — METFORMIN HCL 1000 MG PO TABS: 1000.0000 mg | ORAL_TABLET | Freq: Two times a day (BID) | ORAL | 1 refills | Status: AC

## 2022-09-16 NOTE — Evaluation (Signed)
Occupational Therapy Evaluation/Discharge Patient Details Name: Jay Bailey MRN: [id number] DOB: [birth year] Today's Date: 09/16/2022   History of Present Illness 81 yo male with onset of stroke like symptoms was admitted on 10/13, noted L side weakness and dysarthria.  Had NSVT on the trip to ED which was symptomatic.  MD felt symptoms resolved in ED. Dx of TIA.   PMHx:  B THA's, DM, THN, HLD, L index amputation, bradycardia, depression, inguinal hernia, B hearing aids, skin CA, prostate CA,   Clinical Impression   PTA, Pt lives at [facility], typically Modified Independent with ADLs, basic IADLs and mobility using Rollator primarily. Pt presents now at baseline for ADLs without new deficits. Pt able to mobilize in hallway using RW with SPV for safety and no overt LOB noted. Discussed with pt who agrees no OT follow up needed but is interested in HHPT to further address gait. OT to sign off at acute level, functionally appropriate for DC when deemed medically stable.       Recommendations for follow up therapy are one component of a multi-disciplinary discharge planning process, led by the attending physician.  Recommendations may be updated based on patient status, additional functional criteria and insurance authorization.   Follow Up Recommendations  No OT follow up    Assistance Recommended at Discharge PRN  Patient can return home with the following      Functional Status Assessment  Patient has not had a recent decline in their functional status  Equipment Recommendations  None recommended by OT    Recommendations for Other Services       Precautions / Restrictions Precautions Precautions: Fall Precaution Comments: HOH Restrictions Weight Bearing Restrictions: No      Mobility Bed Mobility Overal bed mobility: Modified Independent                  Transfers Overall transfer level: Modified independent Equipment used: Rolling walker (2  wheels) Transfers: Sit to/from Stand Sit to Stand: Modified independent (Device/Increase time)                  Balance Overall balance assessment: Needs assistance Sitting-balance support: Feet supported Sitting balance-Leahy Scale: Good     Standing balance support: Bilateral upper extremity supported, During functional activity Standing balance-Leahy Scale: Fair                             ADL either performed or assessed with clinical judgement   ADL Overall ADL's : Modified independent                                       General ADL Comments: reports using AE for LB ADLs so was assisted with sock mgmt d/t no AE available. 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 intermittent inappropriate comments towards male staff     General Comments  Requested OT to breakfast for pt though pt very  capable of ordering meals as he has been doing since admission - RN aware    Exercises     Shoulder Instructions      Home Living Family/patient expects to be discharged to:: Other (Comment)                             Home Equipment: Rollator (4 wheels);Cane - single point;Grab bars - tub/shower;Shower seat   Additional Comments: resident of [place] at [facility]      Prior Functioning/Environment Prior Level of Function : Independent/Modified Independent             Mobility Comments: rollator typically or SPC ADLs Comments: MOD I for ADLs, uses shower chair, can manage basic kitchen tasks, drives        OT Problem List: Impaired balance (sitting and/or standing)      OT Treatment/Interventions:      OT Goals(Current goals can be found in the care plan section) Acute Rehab OT Goals Patient Stated Goal: start PT at ILF OT Goal Formulation: All assessment and education complete, DC therapy  OT Frequency:      Co-evaluation              AM-PAC OT "6 Clicks" Daily Activity     Outcome Measure Help from another person eating meals?: None Help from another person taking care of personal grooming?: None Help from another person toileting, which includes using toliet, bedpan, or urinal?: None Help from another person bathing (including washing, rinsing, drying)?: None Help from another person to put on and taking off regular upper body clothing?: None Help from another person to put on and taking off regular lower body clothing?: None 6 Click Score: 24   End of Session Equipment Utilized During Treatment: Rolling walker (2 wheels) Nurse Communication: Mobility status  Activity Tolerance: Patient tolerated treatment well Patient left: in bed;with call bell/phone within reach  OT Visit Diagnosis: Other abnormalities of gait and mobility (R26.89)                Time: 5465-6812 OT Time Calculation (min): 21 min Charges:  OT General Charges $OT Visit: 1  Visit OT Evaluation $OT Eval Low Complexity: 1 Low  Jay Bailey, OTR/L Acute Rehab Services Office: [phone]   Jay Bailey 09/16/2022, 7:54 AM

## 2022-09-16 NOTE — Progress Notes (Signed)
*  PRELIMINARY RESULTS* Echocardiogram 2D Echocardiogram has been performed.  Elpidio Anis 09/16/2022, 3:39 PM

## 2022-09-16 NOTE — Discharge Summary (Signed)
Physician Discharge Summary   Patient: Jay Bailey MRN: [id number] DOB: [birth year]  Admit date:     09/14/2022  Discharge date: 09/16/22  Discharge Physician: Bonnell Public   PCP: Harlan Stains, MD   Recommendations at discharge:   Follow-up with primary care provider and Neurology within 1 week of discharge. Plavix 75 Mg p.o. once daily for 21 days and stop. Continue aspirin 81 Mg p.o. once daily.  Discharge Diagnoses: Principal Problem:   TIA (transient ischemic attack) Active Problems:   NSVT (nonsustained ventricular tachycardia) (HCC)   Essential hypertension   Diabetes mellitus without complication [facility])   Hospital Course: Patient is a 81 year old male with past medical history significant for type 2 diabetes mellitus, hypertension and hyperlipidemia.  Patient was admitted with strokelike symptoms (slurred speech facial asymmetry, lower extremity weakness).  Patient was admitted to the hospital for further assessment and management.  Neurology team directed patient's care.  Patient has been cleared for discharge by the neurology team.  Patient will follow with the primary care provider and the neurology team within 1 week of discharge.  Assessment and Plan: * TIA (transient ischemic attack) Pt with TIA symptoms. Stroke pathway Tele monitor MRI brain CTA head and neck 2d echo Cont home statin Hold BP meds and allow permissive HTN PT/OT/SLP Neuro consult  NSVT (nonsustained ventricular tachycardia) ([place]) Patient with run of NSVT with EMS en-route to hospital.  Had nausea with this. Tele monitor Replacing Mg and K First trop neg, second pending.  Diabetes mellitus without complication ([place]) Hold metformin, use SSI AC/HS  Essential hypertension Hold home BP meds and allow permissive HTN in setting of acute stroke.         Consultants: Neurology Procedures performed: None Disposition: Home Diet recommendation:  Discharge Diet Orders (From  admission, onward)     Start     Ordered   09/16/22 0000  Diet - low sodium heart healthy        09/16/22 1713   09/16/22 0000  Diet Carb Modified        09/16/22 1713            DISCHARGE MEDICATION: Allergies as of 09/16/2022       Reactions   Lisinopril Cough   Other reaction(s): cough        Medication List     STOP taking these medications    benzonatate 100 MG capsule Commonly known as: TESSALON   metFORMIN 500 MG 24 hr tablet Commonly known as: GLUCOPHAGE-XR Replaced by: metFORMIN 1000 MG tablet   ondansetron 4 MG tablet Commonly known as: ZOFRAN   prochlorperazine 10 MG tablet Commonly known as: COMPAZINE       TAKE these medications    Accu-Chek Guide test strip Generic drug: glucose blood   amLODipine 2.5 MG tablet Commonly known as: NORVASC Take 2.5 mg by mouth daily.   ascorbic acid 1000 MG tablet Commonly known as: VITAMIN C Take 1,000 mg by mouth daily.   aspirin 81 MG chewable tablet Chew 1 tablet (81 mg total) by mouth daily. Start taking on: September 17, 2022   atorvastatin 80 MG tablet Commonly known as: LIPITOR Take 80 mg by mouth daily.   buPROPion 150 MG 24 hr tablet Commonly known as: WELLBUTRIN XL Take 150 mg by mouth daily.   clopidogrel 75 MG tablet Commonly known as: PLAVIX Take 1 tablet (75 mg total) by mouth daily for 21 days. Start taking on: September 17, 2022   fluticasone 50 MCG/ACT  nasal spray Commonly known as: FLONASE Place 2 sprays into both nostrils daily. Start taking on: September 17, 9370   folic acid 696 MCG tablet Commonly known as: FOLVITE Take 400 mcg by mouth daily.   irbesartan-hydrochlorothiazide 150-12.5 MG tablet Commonly known as: AVALIDE Take 1 tablet by mouth daily.   loratadine 10 MG tablet Commonly known as: CLARITIN Take 1 tablet (10 mg total) by mouth daily. Start taking on: September 17, 2022   metFORMIN 1000 MG tablet Commonly known as: GLUCOPHAGE Take 1 tablet (1,000 mg  total) by mouth 2 (two) times daily with a meal. Replaces: metFORMIN 500 MG 24 hr tablet   multivitamin tablet Take 1 tablet by mouth daily.   oxybutynin 5 MG 24 hr tablet Commonly known as: DITROPAN-XL Take 5 mg by mouth daily.   pantoprazole 40 MG tablet Commonly known as: PROTONIX Take 1 tablet (40 mg total) by mouth daily.   vitamin E 180 MG (400 UNITS) capsule Take 400 Units by mouth daily.               Durable Medical Equipment  (From admission, onward)           Start     Ordered   09/15/22 1614  For home use only DME Walker  Once       Question:  Patient needs a walker to treat with the following condition  Answer:  Stroke [facility])   09/15/22 [place]. Follow up.   Specialty: Physical Therapy Why: for Home health PT Contact information: [address] [place] [address] [phone]                Discharge Exam: Danley Danker Weights   09/14/22 2000 09/15/22 1856  Weight: 100 kg 100 kg     Condition at discharge: stable  The results of significant diagnostics from this hospitalization (including imaging, microbiology, ancillary and laboratory) are listed below for reference.   Imaging Studies: ECHOCARDIOGRAM COMPLETE BUBBLE STUDY  Result Date: 09/16/2022    ECHOCARDIOGRAM REPORT   Patient Name:   Jay Bailey Date of Exam: 09/16/2022 Medical Rec #:  [id number]      Height:       67.0 in Accession #:    [id number]     Weight:       220.5 lb Date of Birth:  04-02-1941      BSA:          2.108 m Patient Age:    81 years       BP:           150/88 mmHg Patient Gender: M              HR:           45 bpm. Exam Location:  Inpatient Procedure: 2D Echo, Cardiac Doppler, Color Doppler and Saline Contrast Bubble            Study Indications:    Stroke  History:        Patient has no prior history of Echocardiogram examinations.                 Stroke; Risk  Factors:Hypertension and Diabetes.  Sonographer:    Wenda Low Referring Phys: [id number] [place]  1. Left ventricular ejection fraction, by estimation, is 50%. The left ventricle has low normal function. The left  ventricle demonstrates global hypokinesis. There is moderate left ventricular hypertrophy. Left ventricular diastolic parameters are consistent with Grade I diastolic dysfunction (impaired relaxation).  2. Right ventricular systolic function is normal. The right ventricular size is normal. There is normal pulmonary artery systolic pressure. The estimated right ventricular systolic pressure is 62.9 mmHg.  3. The mitral valve is normal in structure. Trivial mitral valve regurgitation. No evidence of mitral stenosis.  4. The aortic valve is tricuspid. There is mild calcification of the aortic valve. Aortic valve regurgitation is trivial. No aortic stenosis is present.  5. The inferior vena cava is normal in size with greater than 50% respiratory variability, suggesting right atrial pressure of 3 mmHg. FINDINGS  Left Ventricle: Left ventricular ejection fraction, by estimation, is 50%. The left ventricle has low normal function. The left ventricle demonstrates global hypokinesis. The left ventricular internal cavity size was normal in size. There is moderate left ventricular hypertrophy. Left ventricular diastolic parameters are consistent with Grade I diastolic dysfunction (impaired relaxation). Right Ventricle: The right ventricular size is normal. No increase in right ventricular wall thickness. Right ventricular systolic function is normal. There is normal pulmonary artery systolic pressure. The tricuspid regurgitant velocity is 2.50 m/s, and  with an assumed right atrial pressure of 3 mmHg, the estimated right ventricular systolic pressure is 47.6 mmHg. Left Atrium: Left atrial size was normal in size. Right Atrium: Right atrial size was normal in size. Pericardium: There is no  evidence of pericardial effusion. Mitral Valve: The mitral valve is normal in structure. Trivial mitral valve regurgitation. No evidence of mitral valve stenosis. MV peak gradient, 3.3 mmHg. The mean mitral valve gradient is 1.0 mmHg. Tricuspid Valve: The tricuspid valve is normal in structure. Tricuspid valve regurgitation is trivial. No evidence of tricuspid stenosis. Aortic Valve: The aortic valve is tricuspid. There is mild calcification of the aortic valve. Aortic valve regurgitation is trivial. No aortic stenosis is present. Aortic valve mean gradient measures 4.0 mmHg. Aortic valve peak gradient measures 8.1 mmHg. Aortic valve area, by VTI measures 2.83 cm. Pulmonic Valve: The pulmonic valve was normal in structure. Pulmonic valve regurgitation is trivial. No evidence of pulmonic stenosis. Aorta: The aortic root is normal in size and structure. Venous: The inferior vena cava is normal in size with greater than 50% respiratory variability, suggesting right atrial pressure of 3 mmHg. IAS/Shunts: No atrial level shunt detected by color flow Doppler. Agitated saline contrast was given intravenously to evaluate for intracardiac shunting.  LEFT VENTRICLE PLAX 2D LVIDd:         5.20 cm   Diastology LVIDs:         3.70 cm   LV e' medial:    8.70 cm/s LV PW:         1.30 cm   LV E/e' medial:  6.0 LV IVS:        1.30 cm   LV e' lateral:   5.13 cm/s LVOT diam:     2.30 cm   LV E/e' lateral: 10.1 LV SV:         85 LV SV Index:   40 LVOT Area:     4.15 cm  RIGHT VENTRICLE RV Basal diam:  3.10 cm RV Mid diam:    2.70 cm RV S prime:     13.80 cm/s TAPSE (M-mode): 2.6 cm LEFT ATRIUM             Index        RIGHT ATRIUM  Index LA diam:        3.90 cm 1.85 cm/m   RA Area:     12.10 cm LA Vol (A2C):   36.1 ml 17.13 ml/m  RA Volume:   23.00 ml  10.91 ml/m LA Vol (A4C):   44.3 ml 21.02 ml/m LA Biplane Vol: 41.9 ml 19.88 ml/m  AORTIC VALVE                    PULMONIC VALVE AV Area (Vmax):    2.96 cm     PV Vmax:        1.25 m/s AV Area (Vmean):   2.58 cm     PV Peak grad:  6.2 mmHg AV Area (VTI):     2.83 cm AV Vmax:           142.00 cm/s AV Vmean:          97.700 cm/s AV VTI:            0.301 m AV Peak Grad:      8.1 mmHg AV Mean Grad:      4.0 mmHg LVOT Vmax:         101.00 cm/s LVOT Vmean:        60.600 cm/s LVOT VTI:          0.205 m LVOT/AV VTI ratio: 0.68  AORTA Ao Root diam: 3.10 cm Ao Asc diam:  3.10 cm MITRAL VALVE               TRICUSPID VALVE MV Area (PHT): 1.84 cm    TR Peak grad:   25.0 mmHg MV Peak grad:  3.3 mmHg    TR Vmax:        250.00 cm/s MV Mean grad:  1.0 mmHg MV Vmax:       0.90 m/s    SHUNTS MV Vmean:      46.5 cm/s   Systemic VTI:  0.20 m MV Decel Time: 412 msec    Systemic Diam: 2.30 cm MV E velocity: 52.00 cm/s MV A velocity: 86.90 cm/s MV E/A ratio:  0.60 Cherlynn Kaiser MD Electronically signed by Cherlynn Kaiser MD Signature Date/Time: 09/16/2022/4:29:10 PM    Final    MR BRAIN WO CONTRAST  Result Date: 09/15/2022 CLINICAL DATA:  Neuro deficit, acute, stroke suspected. Dizziness and near syncope. EXAM: MRI HEAD WITHOUT CONTRAST TECHNIQUE: Multiplanar, multiecho pulse sequences of the brain and surrounding structures were obtained without intravenous contrast. COMPARISON:  CT studies earlier same day FINDINGS: Brain: Diffusion imaging does not show any acute or subacute infarction. No abnormality affects the brainstem or cerebellum. Cerebral hemispheres show moderate chronic small-vessel ischemic changes of the white matter. No cortical or large vessel territory infarction. No mass lesion, hemorrhage, hydrocephalus or extra-axial collection. Vascular: Major vessels at the base of the brain show flow. Skull and upper cervical spine: Negative Sinuses/Orbits: Paranasal sinuses are clear.  Orbits are normal. Other: Extensive bilateral mastoid effusions in middle ear fluid left more than right that could possibly be symptomatic. IMPRESSION: 1. No acute brain finding. Moderate chronic  small-vessel ischemic changes of the cerebral hemispheric white matter. 2. Extensive bilateral mastoid effusions and middle ear fluid left more than right that could possibly be symptomatic. Electronically Signed   By: Nelson Chimes M.D.   On: 09/15/2022 08:49   CT ANGIO HEAD NECK W WO CM  Result Date: 09/15/2022 CLINICAL DATA:  Acute neurologic deficit EXAM: CT ANGIOGRAPHY HEAD AND NECK TECHNIQUE: Multidetector CT imaging  of the head and neck was performed using the standard protocol during bolus administration of intravenous contrast. Multiplanar CT image reconstructions and MIPs were obtained to evaluate the vascular anatomy. Carotid stenosis measurements (when applicable) are obtained utilizing NASCET criteria, using the distal internal carotid diameter as the denominator. RADIATION DOSE REDUCTION: This exam was performed according to the departmental dose-optimization program which includes automated exposure control, adjustment of the mA and/or kV according to patient size and/or use of iterative reconstruction technique. CONTRAST:  46m OMNIPAQUE IOHEXOL 350 MG/ML SOLN COMPARISON:  None Available. FINDINGS: CTA NECK FINDINGS SKELETON: There is no bony spinal canal stenosis. No lytic or blastic lesion. OTHER NECK: Normal pharynx, larynx and major salivary glands. No cervical lymphadenopathy. Unremarkable thyroid gland. UPPER CHEST: Incidental right azygous fissure AORTIC ARCH: There is no calcific atherosclerosis of the aortic arch. There is no aneurysm, dissection or hemodynamically significant stenosis of the visualized portion of the aorta. Conventional 3 vessel aortic branching pattern. The visualized proximal subclavian arteries are widely patent. RIGHT CAROTID SYSTEM: Normal without aneurysm, dissection or stenosis. LEFT CAROTID SYSTEM: Normal without aneurysm, dissection or stenosis. VERTEBRAL ARTERIES: Left dominant configuration. Both origins are clearly patent. There is no dissection, occlusion  or flow-limiting stenosis to the skull base (V1-V3 segments). CTA HEAD FINDINGS POSTERIOR CIRCULATION: --Vertebral arteries: Normal V4 segments. --Inferior cerebellar arteries: Normal. --Basilar artery: Normal. --Superior cerebellar arteries: Normal. --Posterior cerebral arteries (PCA): Normal. ANTERIOR CIRCULATION: --Intracranial internal carotid arteries: Normal. --Anterior cerebral arteries (ACA): Normal. Both A1 segments are present. Patent anterior communicating artery (a-comm). --Middle cerebral arteries (MCA): Normal. VENOUS SINUSES: As permitted by contrast timing, patent. ANATOMIC VARIANTS: None Review of the MIP images confirms the above findings. IMPRESSION: No emergent large vessel occlusion or high-grade stenosis of the intracranial or cervical arteries. Electronically Signed   By: KUlyses JarredM.D.   On: 09/15/2022 03:36   DG CHEST PORT 1 VIEW  Result Date: 09/14/2022 CLINICAL DATA:  History of bradycardia presented with left leg weakness and left-sided facial droop. EXAM: PORTABLE CHEST 1 VIEW COMPARISON:  August 25, 2022 FINDINGS: The heart size and mediastinal contours are within normal limits. Low lung volumes are noted. Mild atelectasis is seen within the left lung base. There is no evidence of a pleural effusion or pneumothorax. Multilevel degenerative changes are seen throughout the thoracic spine. IMPRESSION: Low lung volumes with mild left basilar atelectasis. Electronically Signed   By: TVirgina NorfolkM.D.   On: 09/14/2022 21:30   CT HEAD CODE STROKE WO CONTRAST  Result Date: 09/14/2022 CLINICAL DATA:  Code stroke.  Left-sided deficits. EXAM: CT HEAD WITHOUT CONTRAST TECHNIQUE: Contiguous axial images were obtained from the base of the skull through the vertex without intravenous contrast. RADIATION DOSE REDUCTION: This exam was performed according to the departmental dose-optimization program which includes automated exposure control, adjustment of the mA and/or kV according  to patient size and/or use of iterative reconstruction technique. COMPARISON:  None Available. FINDINGS: Brain: There is no acute intracranial hemorrhage, extra-axial fluid collection, or acute infarct. There is mild parenchymal volume loss. The ventricles are normal in size. Gray-white differentiation is preserved. Patchy hypodensity in the supratentorial white matter likely reflects sequela of mild chronic small vessel ischemic change. There is no mass lesion.  There is no mass effect or midline shift. Vascular: No hyperdense vessel or unexpected calcification. Skull: Normal. Negative for fracture or focal lesion. Sinuses/Orbits: The imaged paranasal sinuses are clear. The imaged globes and orbits are unremarkable. Other: There is a left mastoid  effusion. The partially imaged left nasopharynx is unremarkable. ASPECTS [facility] Stroke Program Early CT Score) - Ganglionic level infarction (caudate, lentiform nuclei, internal capsule, insula, M1-M3 cortex): 7 - Supraganglionic infarction (M4-M6 cortex): 3 Total score (0-10 with 10 being normal): 10 IMPRESSION: 1. No acute intracranial pathology. ASPECTS is 10 2. Left mastoid effusion. These results were paged via AMION at the time of interpretation on 09/14/2022 at 8:37 pm to provider [place]. Electronically Signed   By: Valetta Mole M.D.   On: 09/14/2022 20:39   DG Chest 2 View  Result Date: 08/25/2022 CLINICAL DATA:  Cough. EXAM: CHEST - 2 VIEW COMPARISON:  Chest x-ray 12/01/2010 FINDINGS: The heart size and mediastinal contours are within normal limits. Both lungs are clear. There is mild elevation of the left hemidiaphragm. The visualized skeletal structures are unremarkable. IMPRESSION: No active cardiopulmonary disease. Electronically Signed   By: Ronney Asters M.D.   On: 08/25/2022 16:53    Microbiology: Results for orders placed or performed during the hospital encounter of 08/25/22  Resp Panel by RT-PCR (Flu A&B, Covid) Anterior Nasal Swab      Status: None   Collection Time: 08/25/22  6:45 PM   Specimen: Anterior Nasal Swab  Result Value Ref Range Status   SARS Coronavirus 2 by RT PCR NEGATIVE NEGATIVE Final    Comment: (NOTE) SARS-CoV-2 target nucleic acids are NOT DETECTED.  The SARS-CoV-2 RNA is generally detectable in upper respiratory specimens during the acute phase of infection. The lowest concentration of SARS-CoV-2 viral copies this assay can detect is 138 copies/mL. A negative result does not preclude SARS-Cov-2 infection and should not be used as the sole basis for treatment or other patient management decisions. A negative result may occur with  improper specimen collection/handling, submission of specimen other than nasopharyngeal swab, presence of viral mutation(s) within the areas targeted by this assay, and inadequate number of viral copies(<138 copies/mL). A negative result must be combined with clinical observations, patient history, and epidemiological information. The expected result is Negative.  Fact Sheet for Patients:  EntrepreneurPulse.com.au  Fact Sheet for Healthcare Providers:  IncredibleEmployment.be  This test is no t yet approved or cleared by the [place] FDA and  has been authorized for detection and/or diagnosis of SARS-CoV-2 by FDA under an Emergency Use Authorization (EUA). This EUA will remain  in effect (meaning this test can be used) for the duration of the COVID-19 declaration under Section 564(b)(1) of the Act, 21 U.S.C.section 360bbb-3(b)(1), unless the authorization is terminated  or revoked sooner.       Influenza A by PCR NEGATIVE NEGATIVE Final   Influenza B by PCR NEGATIVE NEGATIVE Final    Comment: (NOTE) The Xpert Xpress SARS-CoV-2/FLU/RSV plus assay is intended as an aid in the diagnosis of influenza from Nasopharyngeal swab specimens and should not be used as a sole basis for treatment. Nasal washings and aspirates are  unacceptable for Xpert Xpress SARS-CoV-2/FLU/RSV testing.  Fact Sheet for Patients: EntrepreneurPulse.com.au  Fact Sheet for Healthcare Providers: IncredibleEmployment.be  This test is not yet approved or cleared by the [place] FDA and has been authorized for detection and/or diagnosis of SARS-CoV-2 by FDA under an Emergency Use Authorization (EUA). This EUA will remain in effect (meaning this test can be used) for the duration of the COVID-19 declaration under Section 564(b)(1) of the Act, 21 U.S.C. section 360bbb-3(b)(1), unless the authorization is terminated or revoked.  Performed at [facility], [place] [address], [place], [place] [address]  Labs: CBC: Recent Labs  Lab 09/14/22 2027 09/14/22 2029  WBC 6.5  --   NEUTROABS 4.7  --   HGB 12.6* 13.6  HCT 37.6* 40.0  MCV 98.2  --   PLT 180  --    Basic Metabolic Panel: Recent Labs  Lab 09/14/22 2027 09/14/22 2029  NA 140 140  K 3.8 3.8  CL 107 103  CO2 24  --   GLUCOSE 144* 141*  BUN 18 21  CREATININE 1.05 1.00  CALCIUM 9.7  --   MG 1.5*  --    Liver Function Tests: Recent Labs  Lab 09/14/22 2027  AST 26  ALT 29  ALKPHOS 89  BILITOT 0.4  PROT 6.8  ALBUMIN 4.0   CBG: Recent Labs  Lab 09/15/22 2129 09/16/22 0604 09/16/22 0837 09/16/22 1130 09/16/22 1655  GLUCAP 171* 161* 123* 180* 109*    Discharge time spent: greater than 30 minutes.  Signed: Bonnell Public, MD [facility] 09/16/2022

## 2022-09-16 NOTE — Progress Notes (Signed)
Discharge home per MD order. Discharge instructions, medications, and follow up appointments all reviewed with the patient and the patients family member, both state they understand instructions. Patient transport home via sister in law, in no acute distress at time of discharge.

## 2022-09-16 NOTE — Progress Notes (Addendum)
STROKE TEAM PROGRESS NOTE   INTERVAL HISTORY  No family is present at the bedside. Mild right mouth droop persists on exam today though patient denies any facial weakness. Per patient identification photo on EMAR, right facial weakness appears chronic.  Patient requests diabetes counseling for better diabetes control and also requests information regarding exercise programs.   Vitals:   09/15/22 1935 09/15/22 2341 09/16/22 0334 09/16/22 0854  BP: (!) 142/63 (!) 160/74 (!) 142/65 136/73  Pulse: (!) 58 (!) 55 (!) 54 60  Resp: '17 15 16 18  '$ Temp: 97.7 F (36.5 C) 98 F (36.7 C) 98.5 F (36.9 C) (!) 97.4 F (36.3 C)  TempSrc: Oral Oral Oral Oral  SpO2: 95% 96% 94% 98%  Weight:      Height:       CBC:  Recent Labs  Lab 09/14/22 2027 09/14/22 2029  WBC 6.5  --   NEUTROABS 4.7  --   HGB 12.6* 13.6  HCT 37.6* 40.0  MCV 98.2  --   PLT 180  --     Basic Metabolic Panel:  Recent Labs  Lab 09/14/22 2027 09/14/22 2029  NA 140 140  K 3.8 3.8  CL 107 103  CO2 24  --   GLUCOSE 144* 141*  BUN 18 21  CREATININE 1.05 1.00  CALCIUM 9.7  --   MG 1.5*  --     Lipid Panel:  Recent Labs  Lab 09/16/22 0434  CHOL 139  TRIG 218*  HDL 36*  CHOLHDL 3.9  VLDL 44*  LDLCALC 59   HgbA1c:  Recent Labs  Lab 09/16/22 0434  HGBA1C 7.6*   Urine Drug Screen: No results for input(s): "LABOPIA", "COCAINSCRNUR", "LABBENZ", "AMPHETMU", "THCU", "LABBARB" in the last 168 hours.  Alcohol Level  Recent Labs  Lab 09/14/22 2027  ETH <10    IMAGING past 24 hours No results found.  PHYSICAL EXAM  Physical Exam  Constitutional: Appears well-developed and well-nourished.  Psych: Affect appropriate to situation, calm and cooperative with exam  Eyes: No scleral injection HENT: No OP obstrucion MSK: no joint deformities.  Cardiovascular: Normal rate and regular rhythm.  Respiratory: Effort normal, non-labored breathing GI: Soft.  No distension. There is no tenderness.  Skin:  WDI  Neuro: Mental Status: Patient is awake, alert, oriented to person, place, month, year, and situation. Patient is able to give a clear and coherent history. No signs of aphasia or neglect. There is mild dysarthria on exam, though family and patient states that his speech is at baseline.  Fluency and comprehension are intact but patient does have trouble with repetition.  Cranial Nerves: II: Visual Fields are full. Pupils are equal, round, and reactive to light.  III,IV, VI: EOMI  V: Facial sensation is symmetric to light touch  VII: Facial movement with mild right mouth droop (appears chronic) VIII: Hearing is intact to voice with hearing aids in place  X: Palate elevates symmetrically XI: Shoulder shrug is symmetric. XII: Tongue protrudes midline without atrophy or fasciculations.  Motor: Tone is normal. Bulk is normal. 5/5 strength was present in all four extremities without vertical drift or asymmetry.  Sensory: Sensation is symmetric to light touch and temperature in the arms and legs.  Cerebellar: FNF and HKS are intact bilaterally  ASSESSMENT/PLAN Jay Bailey is a 81 y.o. male with history of DM2, HTN, HLD, prostate cancer, amputation of the left index finger, depression, and melanoma presenting with sudden onset of left-sided weakness with resolution of symptoms  prior to hospital arrival. Of note, the patient missed his morning antihypertensive medications and was significantly hypertensive with EMS with a systolic blood pressure of 210 mmHg.    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 Reports of deficits PTA included left-sided weakness and left facial asymmetry that resolved PTA. Current deficits on exam include right facial droop, initially reported as new by family though patient states he is at baseline and per patient ID photo, right mouth droop appears chronic.  Code Stroke CTH without acute intracranial pathology, ASPECTS 10.  CTA head & neck no LVO  or high-grade stenosis of the intracranial or cervical arteries.  MRI  without acute intracranial abnormalities.  2D Echo LVEF 50%, LV global hypokinesis, no atrial level shunt detected. LDL 59 HgbA1c 7.6 VTE prophylaxis -Lovenox No antithrombotic prior to admission, now on aspirin 81 mg daily and clopidogrel 75 mg daily DAPT for 3 weeks and then aspirin alone. Therapy recommendations:  Home health PT Disposition:  home  Hypertension Home meds:  Norvasc, Avalide  Unstable on the high end Gradually normalize BP in 2 to 3 days Blood pressure goal normotensive  Hyperlipidemia Home meds:  atorvastatin 80 mg PO LDL 59, goal < 70 Continue Lipitor 80 Continue statin at discharge  Diabetes type II Uncontrolled Home meds:  Metformin  HgbA1c 7.6, goal < 7.0 CBGs SSI Close PCP follow-up for better DM control Patient requests diabetes counseling for better diabetes control and dietary counseling   Other Stroke Risk Factors Advanced Age >/= 42  Obesity, Body mass index is 34.53 kg/m., BMI >/= 30 associated with increased stroke risk, recommend weight loss, diet and exercise as appropriate   Other Active Problems Recent viral/bacterial upper respiratory infection  Prostate cancer Status post left index finger amputation [facility] day # 0  -- Anibal Henderson, AGACNP-BC [facility] [phone]  ATTENDING NOTE: I reviewed above note and agree with the assessment and plan. Pt was seen and examined.   No family at bedside.  Patient lying in bed, neuro stable, unchanged.  Patient still have mild right facial droop, however, by checking patient photo in chart, seems right mild facial droop is chronic.  Therefore, patient's symptoms were consistent with right brain TIA given negative MRI.  Continue DAPT for 3 weeks and then aspirin alone continue Lipitor.  EF 50%, LDL 59, A1c 7.6.  Aggressive risk factor modification.  PT/OT recommend home health.  For detailed  assessment and plan, please refer to above/below as I have made changes wherever appropriate.   Neurology will sign off. Please call with questions. Pt will follow up with stroke clinic NP at [facility] in about 4 weeks. Thanks for the consult.   Rosalin Hawking, MD PhD Stroke Neurology 09/16/2022 5:57 PM    To contact Stroke Continuity provider, please refer to [email or web address] After hours, contact General Neurology

## 2022-09-20 DIAGNOSIS — Z8673 Personal history of transient ischemic attack (TIA), and cerebral infarction without residual deficits: Secondary | ICD-10-CM | POA: Diagnosis not present

## 2022-09-20 DIAGNOSIS — E1151 Type 2 diabetes mellitus with diabetic peripheral angiopathy without gangrene: Secondary | ICD-10-CM | POA: Diagnosis not present

## 2022-09-20 DIAGNOSIS — E785 Hyperlipidemia, unspecified: Secondary | ICD-10-CM | POA: Diagnosis not present

## 2022-09-20 DIAGNOSIS — F330 Major depressive disorder, recurrent, mild: Secondary | ICD-10-CM | POA: Diagnosis not present

## 2022-09-20 DIAGNOSIS — E1140 Type 2 diabetes mellitus with diabetic neuropathy, unspecified: Secondary | ICD-10-CM | POA: Diagnosis not present

## 2022-09-20 DIAGNOSIS — R609 Edema, unspecified: Secondary | ICD-10-CM | POA: Diagnosis not present

## 2022-09-20 DIAGNOSIS — I5189 Other ill-defined heart diseases: Secondary | ICD-10-CM | POA: Diagnosis not present

## 2022-09-20 DIAGNOSIS — I10 Essential (primary) hypertension: Secondary | ICD-10-CM | POA: Diagnosis not present

## 2022-09-24 DIAGNOSIS — R2689 Other abnormalities of gait and mobility: Secondary | ICD-10-CM | POA: Diagnosis not present

## 2022-09-24 DIAGNOSIS — M6281 Muscle weakness (generalized): Secondary | ICD-10-CM | POA: Diagnosis not present

## 2022-09-24 DIAGNOSIS — R278 Other lack of coordination: Secondary | ICD-10-CM | POA: Diagnosis not present

## 2022-09-27 DIAGNOSIS — R2689 Other abnormalities of gait and mobility: Secondary | ICD-10-CM | POA: Diagnosis not present

## 2022-09-27 DIAGNOSIS — M6281 Muscle weakness (generalized): Secondary | ICD-10-CM | POA: Diagnosis not present

## 2022-09-27 DIAGNOSIS — R278 Other lack of coordination: Secondary | ICD-10-CM | POA: Diagnosis not present

## 2022-10-01 DIAGNOSIS — M6281 Muscle weakness (generalized): Secondary | ICD-10-CM | POA: Diagnosis not present

## 2022-10-01 DIAGNOSIS — R2689 Other abnormalities of gait and mobility: Secondary | ICD-10-CM | POA: Diagnosis not present

## 2022-10-01 DIAGNOSIS — R278 Other lack of coordination: Secondary | ICD-10-CM | POA: Diagnosis not present

## 2022-10-03 DIAGNOSIS — R2689 Other abnormalities of gait and mobility: Secondary | ICD-10-CM | POA: Diagnosis not present

## 2022-10-03 DIAGNOSIS — R278 Other lack of coordination: Secondary | ICD-10-CM | POA: Diagnosis not present

## 2022-10-03 DIAGNOSIS — M6281 Muscle weakness (generalized): Secondary | ICD-10-CM | POA: Diagnosis not present

## 2022-10-03 DIAGNOSIS — Z23 Encounter for immunization: Secondary | ICD-10-CM | POA: Diagnosis not present

## 2022-10-05 DIAGNOSIS — R2689 Other abnormalities of gait and mobility: Secondary | ICD-10-CM | POA: Diagnosis not present

## 2022-10-05 DIAGNOSIS — R278 Other lack of coordination: Secondary | ICD-10-CM | POA: Diagnosis not present

## 2022-10-05 DIAGNOSIS — M6281 Muscle weakness (generalized): Secondary | ICD-10-CM | POA: Diagnosis not present

## 2022-10-08 DIAGNOSIS — E785 Hyperlipidemia, unspecified: Secondary | ICD-10-CM | POA: Diagnosis not present

## 2022-10-08 DIAGNOSIS — I10 Essential (primary) hypertension: Secondary | ICD-10-CM | POA: Diagnosis not present

## 2022-10-08 DIAGNOSIS — M6281 Muscle weakness (generalized): Secondary | ICD-10-CM | POA: Diagnosis not present

## 2022-10-08 DIAGNOSIS — R2689 Other abnormalities of gait and mobility: Secondary | ICD-10-CM | POA: Diagnosis not present

## 2022-10-08 DIAGNOSIS — E1140 Type 2 diabetes mellitus with diabetic neuropathy, unspecified: Secondary | ICD-10-CM | POA: Diagnosis not present

## 2022-10-08 DIAGNOSIS — Z6832 Body mass index (BMI) 32.0-32.9, adult: Secondary | ICD-10-CM | POA: Diagnosis not present

## 2022-10-08 DIAGNOSIS — E6609 Other obesity due to excess calories: Secondary | ICD-10-CM | POA: Diagnosis not present

## 2022-10-08 DIAGNOSIS — R278 Other lack of coordination: Secondary | ICD-10-CM | POA: Diagnosis not present

## 2022-10-10 DIAGNOSIS — R278 Other lack of coordination: Secondary | ICD-10-CM | POA: Diagnosis not present

## 2022-10-10 DIAGNOSIS — R2689 Other abnormalities of gait and mobility: Secondary | ICD-10-CM | POA: Diagnosis not present

## 2022-10-10 DIAGNOSIS — M6281 Muscle weakness (generalized): Secondary | ICD-10-CM | POA: Diagnosis not present

## 2022-10-12 DIAGNOSIS — R2689 Other abnormalities of gait and mobility: Secondary | ICD-10-CM | POA: Diagnosis not present

## 2022-10-12 DIAGNOSIS — M6281 Muscle weakness (generalized): Secondary | ICD-10-CM | POA: Diagnosis not present

## 2022-10-12 DIAGNOSIS — R278 Other lack of coordination: Secondary | ICD-10-CM | POA: Diagnosis not present

## 2022-10-15 DIAGNOSIS — M6281 Muscle weakness (generalized): Secondary | ICD-10-CM | POA: Diagnosis not present

## 2022-10-15 DIAGNOSIS — R2689 Other abnormalities of gait and mobility: Secondary | ICD-10-CM | POA: Diagnosis not present

## 2022-10-15 DIAGNOSIS — R278 Other lack of coordination: Secondary | ICD-10-CM | POA: Diagnosis not present

## 2022-10-17 DIAGNOSIS — Z23 Encounter for immunization: Secondary | ICD-10-CM | POA: Diagnosis not present

## 2022-10-17 DIAGNOSIS — M6281 Muscle weakness (generalized): Secondary | ICD-10-CM | POA: Diagnosis not present

## 2022-10-17 DIAGNOSIS — R2689 Other abnormalities of gait and mobility: Secondary | ICD-10-CM | POA: Diagnosis not present

## 2022-10-17 DIAGNOSIS — R278 Other lack of coordination: Secondary | ICD-10-CM | POA: Diagnosis not present

## 2022-10-17 NOTE — Progress Notes (Deleted)
[facility] [address] [place]. [place] [address] [phone]       HOSPITAL FOLLOW UP NOTE  [name] Date of Birth:  [birth year] Medical Record Number:  [id number]   Reason for Referral:  hospital stroke follow up    SUBJECTIVE:   CHIEF COMPLAINT:  No chief complaint on file.   HPI:   [name] is a 81 y.o. male with history of DM2, HTN, HLD, prostate cancer, amputation of the left index finger, depression, and melanoma who presented on 09/14/2022 with sudden onset of left-sided weakness with resolution of symptoms prior to hospital arrival. Of note, the patient missed his morning antihypertensive medications and was significantly hypertensive with EMS with a systolic blood pressure of 210 mmHg.  Evaluated by Dr. Erlinda Hong.  CT head negative.  CTA head/neck negative LVO or high-grade stenosis.  MRI negative for acute infarcts.  EF 50% with LV global hypokinesis, no atrial level shunt detected.  LDL 59.  A1c 7.6.  Etiology of symptoms likely due to right brain TIA secondary to small vessel disease.  Recommended DAPT for 3 weeks and aspirin alone and continuation of home dose atorvastatin 80 mg daily.  No prior stroke history.        PERTINENT IMAGING  Per hospitalization 09/14/2022 -09/16/2022 Code Stroke CTH without acute intracranial pathology, ASPECTS 10.  CTA head & neck no LVO or high-grade stenosis of the intracranial or cervical arteries.  MRI  without acute intracranial abnormalities.  2D Echo LVEF 50%, LV global hypokinesis, no atrial level shunt detected. 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   Cancer (HCC)    Congenital deformity of ankle joint    Depression    a long time ago    Diabetes  mellitus without complication (HCC)    type 2   Hyperlipidemia    Hypertension    Inguinal hernia    Prostate cancer ([place])    Skin cancer (melanoma) ([place])    historical    Wears hearing aid    bialteral     PSH:  Past Surgical History:  Procedure Laterality Date   INGUINAL HERNIA REPAIR     right and left hernias  miltiple times with mesh placement    INGUINAL HERNIA REPAIR Left 12/26/2018   Procedure: OPEN LEFT INGUINAL HERNIA WITH MESH;  Surgeon: Johnathan Hausen, MD;  Location: [facility];  Service: General;  Laterality: Left;   nose myeloma     PROSTATECTOMY  2005   TOTAL HIP ARTHROPLASTY  "long time ago "   bilateral ; [place]     Social History:  Social History   Socioeconomic History   Marital status: Single    Spouse name: Not on file   Number of children: Not on file   Years of education: Not on file   Highest education level: Not on file  Occupational History   Not on file  Tobacco Use   Smoking status: Never   Smokeless tobacco: Never  Vaping Use   Vaping Use: Never used  Substance and Sexual Activity   Alcohol use: No   Drug use: No   Sexual activity: Not Currently  Other  Topics Concern   Not on file  Social History Narrative   Not on file   Social Determinants of Health   Financial Resource Strain: Not on file  Food Insecurity: No Food Insecurity (09/15/2022)   Hunger Vital Sign    Worried About Running Out of Food in the Last Year: Never true    Ran Out of Food in the Last Year: Never true  Transportation Needs: No Transportation Needs (09/15/2022)   PRAPARE - Hydrologist (Medical): No    Lack of Transportation (Non-Medical): No  Physical Activity: Not on file  Stress: Not on file  Social Connections: Not on file  Intimate Partner Violence: Not At Risk (09/15/2022)   Humiliation, Afraid, Rape, and Kick questionnaire    Fear of Current or Ex-Partner: No    Emotionally Abused: No    Physically Abused: No     Sexually Abused: No    Family History:  Family History  Problem Relation Age of Onset   Colon cancer Mother    Kidney cancer Mother    Heart disease Father    Lung cancer Brother    Pancreatic cancer Neg Hx    Prostate cancer Neg Hx    Breast cancer Neg Hx     Medications:   Current Outpatient Medications on File Prior to Visit  Medication Sig Dispense Refill   ACCU-CHEK GUIDE test strip      amLODipine (NORVASC) 2.5 MG tablet Take 2.5 mg by mouth daily.     ascorbic acid (VITAMIN C) 1000 MG tablet Take 1,000 mg by mouth daily.     aspirin 81 MG chewable tablet Chew 1 tablet (81 mg total) by mouth daily. 30 tablet 2   atorvastatin (LIPITOR) 80 MG tablet Take 80 mg by mouth daily.     buPROPion (WELLBUTRIN XL) 150 MG 24 hr tablet Take 150 mg by mouth daily.     fluticasone (FLONASE) 50 MCG/ACT nasal spray Place 2 sprays into both nostrils daily. 9.9 mL 2   folic acid (FOLVITE) 202 MCG tablet Take 400 mcg by mouth daily.      irbesartan-hydrochlorothiazide (AVALIDE) 150-12.5 MG tablet Take 1 tablet by mouth daily.     loratadine (CLARITIN) 10 MG tablet Take 1 tablet (10 mg total) by mouth daily. 30 tablet 0   metFORMIN (GLUCOPHAGE) 1000 MG tablet Take 1 tablet (1,000 mg total) by mouth 2 (two) times daily with a meal. 60 tablet 1   Multiple Vitamin (MULTIVITAMIN) tablet Take 1 tablet by mouth daily.     oxybutynin (DITROPAN-XL) 5 MG 24 hr tablet Take 5 mg by mouth daily.     pantoprazole (PROTONIX) 40 MG tablet Take 1 tablet (40 mg total) by mouth daily. 30 tablet 1   vitamin E 400 UNIT capsule Take 400 Units by mouth daily.     No current facility-administered medications on file prior to visit.    Allergies:   Allergies  Allergen Reactions   Lisinopril Cough    Other reaction(s): cough       OBJECTIVE:  Physical Exam  There were no vitals filed for this visit. There is no height or weight on file to calculate BMI. No results found.     06/13/2016   12:54 PM   Depression screen PHQ 2/9  Decreased Interest 0  Down, Depressed, Hopeless 0  PHQ - 2 Score 0     General: well developed, well nourished, seated, in no evident distress Head: head normocephalic  and atraumatic.   Neck: supple with no carotid or supraclavicular bruits Cardiovascular: regular rate and rhythm, no murmurs Musculoskeletal: no deformity Skin:  no rash/petichiae Vascular:  Normal pulses all extremities   Neurologic Exam Mental Status: Awake and fully alert. Oriented to place and time. Recent and remote memory intact. Attention span, concentration and fund of knowledge appropriate. Mood and affect appropriate.  Cranial Nerves: Fundoscopic exam reveals sharp disc margins. Pupils equal, briskly reactive to light. Extraocular movements full without nystagmus. Visual fields full to confrontation. Hearing intact. Facial sensation intact. Face, tongue, palate moves normally and symmetrically.  Motor: Normal bulk and tone. Normal strength in all tested extremity muscles Sensory.: intact to touch , pinprick , position and vibratory sensation.  Coordination: Rapid alternating movements normal in all extremities. Finger-to-nose and heel-to-shin performed accurately bilaterally. Gait and Station: Arises from chair without difficulty. Stance is normal. Gait demonstrates normal stride length and balance with ***. Tandem walk and heel toe ***.  Reflexes: 1+ and symmetric. Toes downgoing.     NIHSS  *** Modified Rankin  ***      ASSESSMENT: Jay Bailey is a 81 y.o. 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 Vascular risk factors include HTN, HLD, DM and advanced age.      PLAN:  TIA:  Residual deficit: ***.  Continue aspirin '81mg'$  daily and atorvastatin (Lipitor) for secondary stroke prevention.   Discussed secondary stroke prevention  measures and importance of close PCP follow up for aggressive stroke risk factor management including BP goal<130/90, HLD with LDL goal<70 and DM with A1c.<7 .  Stroke labs 09/2022: LDL 59, A1c 7.6 I have gone over the pathophysiology of stroke, warning signs and symptoms, risk factors and their management in some detail with instructions to go to the closest emergency room for symptoms of concern.     Follow up in *** or call earlier if needed   CC:  [facility] provider: Dr. Leonie Man PCP: Harlan Stains, MD    I spent *** minutes of face-to-face and non-face-to-face time with patient.  This included previsit chart review including review of recent hospitalization, lab review, study review, order entry, electronic health record documentation, patient education regarding recent stroke including etiology, secondary stroke prevention measures and importance of managing stroke risk factors, residual deficits and typical recovery time and answered all other questions to patient satisfaction   Frann Rider, AGNP-BC  [facility] [address] [place], [place] [address]  Phone [phone] Fax [phone] Note: This document was prepared with digital dictation and possible smart phrase technology. Any transcriptional errors that result from this process are unintentional.

## 2022-10-18 ENCOUNTER — Inpatient Hospital Stay: Payer: Medicare Other | Admitting: Adult Health

## 2022-10-19 DIAGNOSIS — M6281 Muscle weakness (generalized): Secondary | ICD-10-CM | POA: Diagnosis not present

## 2022-10-19 DIAGNOSIS — R2689 Other abnormalities of gait and mobility: Secondary | ICD-10-CM | POA: Diagnosis not present

## 2022-10-19 DIAGNOSIS — R278 Other lack of coordination: Secondary | ICD-10-CM | POA: Diagnosis not present

## 2022-10-21 DIAGNOSIS — M6281 Muscle weakness (generalized): Secondary | ICD-10-CM | POA: Diagnosis not present

## 2022-10-21 DIAGNOSIS — R2689 Other abnormalities of gait and mobility: Secondary | ICD-10-CM | POA: Diagnosis not present

## 2022-10-21 DIAGNOSIS — R278 Other lack of coordination: Secondary | ICD-10-CM | POA: Diagnosis not present

## 2022-10-23 DIAGNOSIS — R278 Other lack of coordination: Secondary | ICD-10-CM | POA: Diagnosis not present

## 2022-10-23 DIAGNOSIS — M6281 Muscle weakness (generalized): Secondary | ICD-10-CM | POA: Diagnosis not present

## 2022-10-23 DIAGNOSIS — R2689 Other abnormalities of gait and mobility: Secondary | ICD-10-CM | POA: Diagnosis not present

## 2022-11-01 DIAGNOSIS — R278 Other lack of coordination: Secondary | ICD-10-CM | POA: Diagnosis not present

## 2022-11-01 DIAGNOSIS — M6281 Muscle weakness (generalized): Secondary | ICD-10-CM | POA: Diagnosis not present

## 2022-11-01 DIAGNOSIS — R2689 Other abnormalities of gait and mobility: Secondary | ICD-10-CM | POA: Diagnosis not present

## 2022-11-02 DIAGNOSIS — R2689 Other abnormalities of gait and mobility: Secondary | ICD-10-CM | POA: Diagnosis not present

## 2022-11-02 DIAGNOSIS — R278 Other lack of coordination: Secondary | ICD-10-CM | POA: Diagnosis not present

## 2022-11-02 DIAGNOSIS — M6281 Muscle weakness (generalized): Secondary | ICD-10-CM | POA: Diagnosis not present

## 2022-11-05 DIAGNOSIS — E1140 Type 2 diabetes mellitus with diabetic neuropathy, unspecified: Secondary | ICD-10-CM | POA: Diagnosis not present

## 2022-11-05 DIAGNOSIS — H903 Sensorineural hearing loss, bilateral: Secondary | ICD-10-CM | POA: Diagnosis not present

## 2022-11-05 DIAGNOSIS — E6609 Other obesity due to excess calories: Secondary | ICD-10-CM | POA: Diagnosis not present

## 2022-11-05 DIAGNOSIS — E785 Hyperlipidemia, unspecified: Secondary | ICD-10-CM | POA: Diagnosis not present

## 2022-11-05 DIAGNOSIS — H6123 Impacted cerumen, bilateral: Secondary | ICD-10-CM | POA: Diagnosis not present

## 2022-11-06 DIAGNOSIS — R2689 Other abnormalities of gait and mobility: Secondary | ICD-10-CM | POA: Diagnosis not present

## 2022-11-06 DIAGNOSIS — M6281 Muscle weakness (generalized): Secondary | ICD-10-CM | POA: Diagnosis not present

## 2022-11-06 DIAGNOSIS — R278 Other lack of coordination: Secondary | ICD-10-CM | POA: Diagnosis not present

## 2022-11-07 ENCOUNTER — Ambulatory Visit: Payer: Medicare Other | Admitting: Podiatry

## 2022-11-08 DIAGNOSIS — R2689 Other abnormalities of gait and mobility: Secondary | ICD-10-CM | POA: Diagnosis not present

## 2022-11-08 DIAGNOSIS — R278 Other lack of coordination: Secondary | ICD-10-CM | POA: Diagnosis not present

## 2022-11-08 DIAGNOSIS — M6281 Muscle weakness (generalized): Secondary | ICD-10-CM | POA: Diagnosis not present

## 2022-11-13 DIAGNOSIS — R278 Other lack of coordination: Secondary | ICD-10-CM | POA: Diagnosis not present

## 2022-11-13 DIAGNOSIS — M6281 Muscle weakness (generalized): Secondary | ICD-10-CM | POA: Diagnosis not present

## 2022-11-13 DIAGNOSIS — R2689 Other abnormalities of gait and mobility: Secondary | ICD-10-CM | POA: Diagnosis not present

## 2022-11-15 DIAGNOSIS — R278 Other lack of coordination: Secondary | ICD-10-CM | POA: Diagnosis not present

## 2022-11-15 DIAGNOSIS — R2689 Other abnormalities of gait and mobility: Secondary | ICD-10-CM | POA: Diagnosis not present

## 2022-11-15 DIAGNOSIS — M6281 Muscle weakness (generalized): Secondary | ICD-10-CM | POA: Diagnosis not present

## 2022-11-15 NOTE — Progress Notes (Deleted)
[facility] [address] [place]. [place] [address] [phone]       HOSPITAL FOLLOW UP NOTE  [name] Date of Birth:  1941-02-23 Medical Record Number:  [id number]   Reason for Referral:  hospital stroke follow up    SUBJECTIVE:   CHIEF COMPLAINT:  No chief complaint on file.   HPI:   [name] is a 81 y.o. male with history of DM2, HTN, HLD, prostate cancer, amputation of the left index finger, depression, and melanoma who presented on 09/14/2022 with sudden onset of left-sided weakness with resolution of symptoms prior to hospital arrival. Of note, the patient missed his morning antihypertensive medications and was significantly hypertensive with EMS with a systolic blood pressure of 210 mmHg.  Evaluated by Dr. Erlinda Hong.  CT head negative.  CTA head/neck negative LVO or high-grade stenosis.  MRI negative for acute infarcts.  EF 50% with LV global hypokinesis, no atrial level shunt detected.  LDL 59.  A1c 7.6.  Etiology of symptoms likely due to right brain TIA secondary to small vessel disease.  Recommended DAPT for 3 weeks and aspirin alone and continuation of home dose atorvastatin 80 mg daily.  No prior stroke history.        PERTINENT IMAGING  Per hospitalization 09/14/2022 -09/16/2022 Code Stroke CTH without acute intracranial pathology, ASPECTS 10.  CTA head & neck no LVO or high-grade stenosis of the intracranial or cervical arteries.  MRI  without acute intracranial abnormalities.  2D Echo LVEF 50%, LV global hypokinesis, no atrial level shunt detected. 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   Cancer (HCC)    Congenital deformity of ankle joint    Depression    a long time ago    Diabetes  mellitus without complication (HCC)    type 2   Hyperlipidemia    Hypertension    Inguinal hernia    Prostate cancer ([place])    Skin cancer (melanoma) ([place])    historical    Wears hearing aid    bialteral     PSH:  Past Surgical History:  Procedure Laterality Date   INGUINAL HERNIA REPAIR     right and left hernias  miltiple times with mesh placement    INGUINAL HERNIA REPAIR Left 12/26/2018   Procedure: OPEN LEFT INGUINAL HERNIA WITH MESH;  Surgeon: Johnathan Hausen, MD;  Location: [facility];  Service: General;  Laterality: Left;   nose myeloma     PROSTATECTOMY  2005   TOTAL HIP ARTHROPLASTY  "long time ago "   bilateral ; [place]     Social History:  Social History   Socioeconomic History   Marital status: Single    Spouse name: Not on file   Number of children: Not on file   Years of education: Not on file   Highest education level: Not on file  Occupational History   Not on file  Tobacco Use   Smoking status: Never   Smokeless tobacco: Never  Vaping Use   Vaping Use: Never used  Substance and Sexual Activity   Alcohol use: No   Drug use: No   Sexual activity: Not Currently  Other  Topics Concern   Not on file  Social History Narrative   Not on file   Social Determinants of Health   Financial Resource Strain: Not on file  Food Insecurity: No Food Insecurity (09/15/2022)   Hunger Vital Sign    Worried About Running Out of Food in the Last Year: Never true    Ran Out of Food in the Last Year: Never true  Transportation Needs: No Transportation Needs (09/15/2022)   PRAPARE - Hydrologist (Medical): No    Lack of Transportation (Non-Medical): No  Physical Activity: Not on file  Stress: Not on file  Social Connections: Not on file  Intimate Partner Violence: Not At Risk (09/15/2022)   Humiliation, Afraid, Rape, and Kick questionnaire    Fear of Current or Ex-Partner: No    Emotionally Abused: No    Physically Abused: No     Sexually Abused: No    Family History:  Family History  Problem Relation Age of Onset   Colon cancer Mother    Kidney cancer Mother    Heart disease Father    Lung cancer Brother    Pancreatic cancer Neg Hx    Prostate cancer Neg Hx    Breast cancer Neg Hx     Medications:   Current Outpatient Medications on File Prior to Visit  Medication Sig Dispense Refill   ACCU-CHEK GUIDE test strip      amLODipine (NORVASC) 2.5 MG tablet Take 2.5 mg by mouth daily.     ascorbic acid (VITAMIN C) 1000 MG tablet Take 1,000 mg by mouth daily.     aspirin 81 MG chewable tablet Chew 1 tablet (81 mg total) by mouth daily. 30 tablet 2   atorvastatin (LIPITOR) 80 MG tablet Take 80 mg by mouth daily.     buPROPion (WELLBUTRIN XL) 150 MG 24 hr tablet Take 150 mg by mouth daily.     fluticasone (FLONASE) 50 MCG/ACT nasal spray Place 2 sprays into both nostrils daily. 9.9 mL 2   folic acid (FOLVITE) 856 MCG tablet Take 400 mcg by mouth daily.      irbesartan-hydrochlorothiazide (AVALIDE) 150-12.5 MG tablet Take 1 tablet by mouth daily.     loratadine (CLARITIN) 10 MG tablet Take 1 tablet (10 mg total) by mouth daily. 30 tablet 0   metFORMIN (GLUCOPHAGE) 1000 MG tablet Take 1 tablet (1,000 mg total) by mouth 2 (two) times daily with a meal. 60 tablet 1   Multiple Vitamin (MULTIVITAMIN) tablet Take 1 tablet by mouth daily.     oxybutynin (DITROPAN-XL) 5 MG 24 hr tablet Take 5 mg by mouth daily.     pantoprazole (PROTONIX) 40 MG tablet Take 1 tablet (40 mg total) by mouth daily. 30 tablet 1   vitamin E 400 UNIT capsule Take 400 Units by mouth daily.     No current facility-administered medications on file prior to visit.    Allergies:   Allergies  Allergen Reactions   Lisinopril Cough    Other reaction(s): cough       OBJECTIVE:  Physical Exam  There were no vitals filed for this visit. There is no height or weight on file to calculate BMI. No results found.     06/13/2016   12:54 PM   Depression screen PHQ 2/9  Decreased Interest 0  Down, Depressed, Hopeless 0  PHQ - 2 Score 0     General: well developed, well nourished, seated, in no evident distress Head: head normocephalic  and atraumatic.   Neck: supple with no carotid or supraclavicular bruits Cardiovascular: regular rate and rhythm, no murmurs Musculoskeletal: no deformity Skin:  no rash/petichiae Vascular:  Normal pulses all extremities   Neurologic Exam Mental Status: Awake and fully alert. Oriented to place and time. Recent and remote memory intact. Attention span, concentration and fund of knowledge appropriate. Mood and affect appropriate.  Cranial Nerves: Fundoscopic exam reveals sharp disc margins. Pupils equal, briskly reactive to light. Extraocular movements full without nystagmus. Visual fields full to confrontation. Hearing intact. Facial sensation intact. Face, tongue, palate moves normally and symmetrically.  Motor: Normal bulk and tone. Normal strength in all tested extremity muscles Sensory.: intact to touch , pinprick , position and vibratory sensation.  Coordination: Rapid alternating movements normal in all extremities. Finger-to-nose and heel-to-shin performed accurately bilaterally. Gait and Station: Arises from chair without difficulty. Stance is normal. Gait demonstrates normal stride length and balance with ***. Tandem walk and heel toe ***.  Reflexes: 1+ and symmetric. Toes downgoing.     NIHSS  *** Modified Rankin  ***      ASSESSMENT: Jay Bailey is a 81 y.o. 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 Vascular risk factors include HTN, HLD, DM and advanced age.      PLAN:  TIA:  Residual deficit: ***.  Continue aspirin '81mg'$  daily and atorvastatin (Lipitor) for secondary stroke prevention.   Discussed secondary stroke prevention  measures and importance of close PCP follow up for aggressive stroke risk factor management including BP goal<130/90, HLD with LDL goal<70 and DM with A1c.<7 .  Stroke labs 09/2022: LDL 59, A1c 7.6 I have gone over the pathophysiology of stroke, warning signs and symptoms, risk factors and their management in some detail with instructions to go to the closest emergency room for symptoms of concern.     Follow up in *** or call earlier if needed   CC:  [facility] provider: Dr. Leonie Man PCP: Harlan Stains, MD    I spent *** minutes of face-to-face and non-face-to-face time with patient.  This included previsit chart review including review of recent hospitalization, lab review, study review, order entry, electronic health record documentation, patient education regarding recent stroke including etiology, secondary stroke prevention measures and importance of managing stroke risk factors, residual deficits and typical recovery time and answered all other questions to patient satisfaction   Frann Rider, AGNP-BC  [facility] [address] [place], [place] [address]  Phone [phone] Fax [phone] Note: This document was prepared with digital dictation and possible smart phrase technology. Any transcriptional errors that result from this process are unintentional.

## 2022-11-19 ENCOUNTER — Inpatient Hospital Stay: Payer: Medicare Other | Admitting: Adult Health

## 2022-11-20 DIAGNOSIS — R278 Other lack of coordination: Secondary | ICD-10-CM | POA: Diagnosis not present

## 2022-11-20 DIAGNOSIS — R2689 Other abnormalities of gait and mobility: Secondary | ICD-10-CM | POA: Diagnosis not present

## 2022-11-20 DIAGNOSIS — M6281 Muscle weakness (generalized): Secondary | ICD-10-CM | POA: Diagnosis not present

## 2022-11-21 ENCOUNTER — Encounter: Payer: Self-pay | Admitting: Podiatry

## 2022-11-21 ENCOUNTER — Ambulatory Visit (INDEPENDENT_AMBULATORY_CARE_PROVIDER_SITE_OTHER): Payer: Medicare Other | Admitting: Podiatry

## 2022-11-21 DIAGNOSIS — B351 Tinea unguium: Secondary | ICD-10-CM | POA: Diagnosis not present

## 2022-11-21 DIAGNOSIS — M79676 Pain in unspecified toe(s): Secondary | ICD-10-CM

## 2022-11-21 DIAGNOSIS — E119 Type 2 diabetes mellitus without complications: Secondary | ICD-10-CM

## 2022-11-21 NOTE — Progress Notes (Signed)
This patient returns to my office for at risk foot care.  This patient requires this care by a professional since this patient will be at risk due to having  Diabetes.   This patient is unable to cut nails himself since the patient cannot reach his nails.These nails are painful walking and wearing shoes.  Patient is also concerned about swelling in his feet. He says there is no pain in his swollen feet.   This patient presents for at risk foot care today.  General Appearance  Alert, conversant and in no acute stress.  Vascular  Dorsalis pedis and posterior tibial  pulses are weakly  palpable  bilaterally.  Capillary return is within normal limits  bilaterally. Temperature is within normal limits  Bilaterally.  Venous stasis both legs.  Swelling feet.  Neurologic  Senn-Weinstein monofilament wire test within normal limits  bilaterally. Muscle power within normal limits bilaterally.  Nails Thick disfigured discolored nails with subungual debris  from hallux to fifth toes bilaterally. No evidence of bacterial infection or drainage bilaterally.  Orthopedic  No limitations of motion  feet .  No crepitus or effusions noted.  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  Prominent metatarsal heads  B/L.  Skin  normotropic skin with no porokeratosis noted bilaterally.  No signs of infections or ulcers noted.     Onychomycosis  Pain in right toes  Pain in left toes  Consent was obtained for treatment procedures.   Mechanical debridement of nails 1-5  bilaterally performed with a nail nipper.  Filed with dremel without incident.     Return office visit    10 weeks                  Told patient to return for periodic foot care and evaluation due to potential at risk complications.   Javad Salva DPM  

## 2022-12-24 ENCOUNTER — Ambulatory Visit (INDEPENDENT_AMBULATORY_CARE_PROVIDER_SITE_OTHER): Payer: Self-pay | Admitting: Adult Health

## 2022-12-24 NOTE — Progress Notes (Signed)
error 

## 2022-12-25 DIAGNOSIS — L821 Other seborrheic keratosis: Secondary | ICD-10-CM | POA: Diagnosis not present

## 2022-12-25 DIAGNOSIS — Z85820 Personal history of malignant melanoma of skin: Secondary | ICD-10-CM | POA: Diagnosis not present

## 2022-12-25 DIAGNOSIS — D2272 Melanocytic nevi of left lower limb, including hip: Secondary | ICD-10-CM | POA: Diagnosis not present

## 2022-12-25 DIAGNOSIS — D225 Melanocytic nevi of trunk: Secondary | ICD-10-CM | POA: Diagnosis not present

## 2022-12-25 DIAGNOSIS — L570 Actinic keratosis: Secondary | ICD-10-CM | POA: Diagnosis not present

## 2022-12-25 DIAGNOSIS — L578 Other skin changes due to chronic exposure to nonionizing radiation: Secondary | ICD-10-CM | POA: Diagnosis not present

## 2022-12-26 ENCOUNTER — Encounter: Payer: Self-pay | Admitting: Cardiology

## 2022-12-26 ENCOUNTER — Ambulatory Visit: Payer: Medicare Other | Attending: Cardiology | Admitting: Cardiology

## 2022-12-26 ENCOUNTER — Ambulatory Visit (INDEPENDENT_AMBULATORY_CARE_PROVIDER_SITE_OTHER): Payer: Medicare Other

## 2022-12-26 ENCOUNTER — Other Ambulatory Visit: Payer: Self-pay | Admitting: Cardiology

## 2022-12-26 VITALS — BP 112/62 | HR 64 | Ht 67.0 in | Wt 192.6 lb

## 2022-12-26 DIAGNOSIS — R001 Bradycardia, unspecified: Secondary | ICD-10-CM | POA: Diagnosis not present

## 2022-12-26 DIAGNOSIS — I5189 Other ill-defined heart diseases: Secondary | ICD-10-CM

## 2022-12-26 DIAGNOSIS — I4729 Other ventricular tachycardia: Secondary | ICD-10-CM

## 2022-12-26 DIAGNOSIS — I10 Essential (primary) hypertension: Secondary | ICD-10-CM | POA: Diagnosis not present

## 2022-12-26 DIAGNOSIS — G459 Transient cerebral ischemic attack, unspecified: Secondary | ICD-10-CM

## 2022-12-26 NOTE — Patient Instructions (Signed)
Medication Instructions:  Your physician recommends that you continue on your current medications as directed. Please refer to the Current Medication list given to you today.  *If you need a refill on your cardiac medications before your next appointment, please call your pharmacy*   Lab Work: None.  If you have labs (blood work) drawn today and your tests are completely normal, you will receive your results only by: [place] (if you have MyChart) OR A paper copy in the mail If you have any lab test that is abnormal or we need to change your treatment, we will call you to review the results.   Testing/Procedures: [place]- Long Term Monitor Instructions  Your physician has requested you wear a ZIO patch monitor for 14 days.  This is a single patch monitor. [employer] supplies one patch monitor per enrollment. Additional stickers are not available. Please do not apply patch if you will be having a Nuclear Stress Test,  Echocardiogram, Cardiac CT, MRI, or Chest Xray during the period you would be wearing the  monitor. The patch cannot be worn during these tests. You cannot remove and re-apply the  ZIO XT patch monitor.  Your ZIO patch monitor will be mailed 3 day USPS to your address on file. It may take 3-5 days  to receive your monitor after you have been enrolled.  Once you have received your monitor, please review the enclosed instructions. Your monitor  has already been registered assigning a specific monitor serial # to you.  Billing and Patient Assistance Program Information  We have supplied [employer] with any of your insurance information on file for billing purposes. [employer] offers a sliding scale Patient Assistance Program for patients that do not have  insurance, or whose insurance does not completely cover the cost of the ZIO monitor.  You must apply for the Patient Assistance Program to qualify for this discounted rate.  To apply, please call [employer] at [phone],  select option 4, select option 2, ask to apply for  Patient Assistance Program. Theodore Demark will ask your household income, and how many people  are in your household. They will quote your out-of-pocket cost based on that information.  [employer] will also be able to set up a 22-month interest-free payment plan if needed.  Applying the monitor   Shave hair from upper left chest.  Hold abrader disc by orange tab. Rub abrader in 40 strokes over the upper left chest as  indicated in your monitor instructions.  Clean area with 4 enclosed alcohol pads. Let dry.  Apply patch as indicated in monitor instructions. Patch will be placed under collarbone on left  side of chest with arrow pointing upward.  Rub patch adhesive wings for 2 minutes. Remove white label marked "1". Remove the white  label marked "2". Rub patch adhesive wings for 2 additional minutes.  While looking in a mirror, press and release button in center of patch. A small green light will  flash 3-4 times. This will be your only indicator that the monitor has been turned on.  Do not shower for the first 24 hours. You may shower after the first 24 hours.  Press the button if you feel a symptom. You will hear a small click. Record Date, Time and  Symptom in the Patient Logbook.  When you are ready to remove the patch, follow instructions on the last 2 pages of Patient  Logbook. Stick patch monitor onto the last page of Patient Logbook.  Place Patient Logbook  in the blue and white box. Use locking tab on box and tape box closed  securely. The blue and white box has prepaid postage on it. Please place it in the mailbox as  soon as possible. Your physician should have your test results approximately 7 days after the  monitor has been mailed back to [facility].  Call [place] at [phone] if you have questions regarding  your ZIO XT patch monitor. Call them immediately if you see an orange light blinking on your   monitor.  If your monitor falls off in less than 4 days, contact our Monitor department at [phone].  If your monitor becomes loose or falls off after 4 days call [employer] at [phone] for  suggestions on securing your monitor    Follow-Up:  Your next appointment:   1 year(s)  Provider:   Dr. Fransico Him, MD

## 2022-12-26 NOTE — Progress Notes (Signed)
Cardiology CONSULT Note    Date:  12/26/2022   ID:  Fitzpatrick, Alberico 1941-07-16, MRN [id number]  PCP:  Harlan Stains, MD  Cardiologist:  Fransico Him, MD   Chief Complaint  Patient presents with   New Patient (Initial Visit)    Diastolic dysfunction    History of Present Illness:  Jay Bailey is a 82 y.o. male who is being seen today for the evaluation and reestablishment of cardiac care at the request of Harlan Stains, MD.  This is a 82 year old male with a history of bradycardia, hypertension, type 2 diabetes mellitus, hyperlipidemia, TIA and NSVT in the setting of hypomagnesemia.  2D echo at that time showed EF 50% with grade 1 diastolic dysfunction.  He is doing well.  He denies any chest pain or pressure, SOB, DOE, PND, orthopnea, LE edema, dizziness, palpitations or syncope. He is compliant with his meds and is tolerating meds with no SE.    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   Cancer ([place])    Congenital deformity of ankle joint    Depression    a long time ago    Diabetes mellitus without complication (HCC)    type 2   Hyperlipidemia    Hypertension    Inguinal hernia    Prostate cancer ([place])    Skin cancer (melanoma) ([place])    historical    Wears hearing aid    bialteral     Past Surgical History:  Procedure Laterality Date   INGUINAL HERNIA REPAIR     right and left hernias  miltiple times with mesh placement    INGUINAL HERNIA REPAIR Left 12/26/2018   Procedure: OPEN LEFT INGUINAL HERNIA WITH MESH;  Surgeon: Johnathan Hausen, MD;  Location: [facility];  Service: General;  Laterality: Left;   nose myeloma     PROSTATECTOMY  2005   TOTAL HIP ARTHROPLASTY  "long time ago "   bilateral ; [place]     Current Medications: Current Meds  Medication Sig   ACCU-CHEK GUIDE test strip    amLODipine (NORVASC) 2.5 MG tablet Take 2.5  mg by mouth daily.   ascorbic acid (VITAMIN C) 1000 MG tablet Take 1,000 mg by mouth daily.   atorvastatin (LIPITOR) 80 MG tablet Take 80 mg by mouth daily.   buPROPion (WELLBUTRIN XL) 150 MG 24 hr tablet Take 150 mg by mouth daily.   folic acid (FOLVITE) 937 MCG tablet Take 400 mcg by mouth daily.    irbesartan-hydrochlorothiazide (AVALIDE) 150-12.5 MG tablet Take 1 tablet by mouth daily.   metFORMIN (GLUCOPHAGE) 1000 MG tablet Take 1 tablet (1,000 mg total) by mouth 2 (two) times daily with a meal.   Multiple Vitamin (MULTIVITAMIN) tablet Take 1 tablet by mouth daily.   oxybutynin (DITROPAN-XL) 5 MG 24 hr tablet Take 5 mg by mouth daily.   vitamin E 400 UNIT capsule Take 400 Units by mouth daily.   [DISCONTINUED] fluticasone (FLONASE) 50 MCG/ACT nasal spray Place 2 sprays into both nostrils daily.   [DISCONTINUED] pantoprazole (PROTONIX) 40 MG tablet Take 1 tablet (40 mg total) by mouth daily.    Allergies:   Lisinopril   Social History   Socioeconomic History   Marital status: Single    Spouse name: Not on file   Number of children: Not  on file   Years of education: Not on file   Highest education level: Not on file  Occupational History   Not on file  Tobacco Use   Smoking status: Never   Smokeless tobacco: Never  Vaping Use   Vaping Use: Never used  Substance and Sexual Activity   Alcohol use: No   Drug use: No   Sexual activity: Not Currently  Other Topics Concern   Not on file  Social History Narrative   Not on file   Social Determinants of Health   Financial Resource Strain: Not on file  Food Insecurity: No Food Insecurity (09/15/2022)   Hunger Vital Sign    Worried About Running Out of Food in the Last Year: Never true    Ran Out of Food in the Last Year: Never true  Transportation Needs: No Transportation Needs (09/15/2022)   PRAPARE - Hydrologist (Medical): No    Lack of Transportation (Non-Medical): No  Physical Activity: Not  on file  Stress: Not on file  Social Connections: Not on file     Family History:  The patient's family history includes Colon cancer in his mother; Heart disease in his father; Kidney cancer in his mother; Lung cancer in his brother.   ROS:   Please see the history of present illness.    ROS All other systems reviewed and are negative.      No data to display             PHYSICAL EXAM:   VS:  BP 112/62   Pulse 64   Ht '5\' 7"'$  (1.702 m)   Wt 192 lb 9.6 oz (87.4 kg)   SpO2 99%   BMI 30.17 kg/m    GEN: Well nourished, well developed, in no acute distress  HEENT: normal  Neck: no JVD, carotid bruits, or masses Cardiac: RRR; no murmurs, rubs, or gallops,no edema.  Intact distal pulses bilaterally.  Respiratory:  clear to auscultation bilaterally, normal work of breathing GI: soft, nontender, nondistended, + BS MS: no deformity or atrophy  Skin: warm and dry, no rash Neuro:  Alert and Oriented x 3, Strength and sensation are intact Psych: euthymic mood, full affect  Wt Readings from Last 3 Encounters:  12/26/22 192 lb 9.6 oz (87.4 kg)  09/15/22 220 lb 7.4 oz (100 kg)  08/25/22 194 lb 0.1 oz (88 kg)      Studies/Labs Reviewed:   EKG:  EKG is not ordered today.    Recent Labs: 09/14/2022: ALT 29; B Natriuretic Peptide 70.5; BUN 21; Creatinine, Ser 1.00; Hemoglobin 13.6; Magnesium 1.5; Platelets 180; Potassium 3.8; Sodium 140   Lipid Panel    Component Value Date/Time   CHOL 139 09/16/2022 0434   TRIG 218 (H) 09/16/2022 0434   HDL 36 (L) 09/16/2022 0434   CHOLHDL 3.9 09/16/2022 0434   VLDL 44 (H) 09/16/2022 0434   LDLCALC 59 09/16/2022 0434    Additional studies/ records that were reviewed today include:  none    ASSESSMENT:    1. Bradycardia   2. Diastolic dysfunction   3. NSVT (nonsustained ventricular tachycardia) ([place])   4. Essential hypertension      PLAN:  In order of problems listed above:  Bradycardia  -He remains asymptomatic   2.   Diastolic dysfunction -Noted on 2D echo 09/16/2022 with grade 1 diastolic dysfunction -He appears euvolemic on exam  3.  NSVT -This was noted on telemetry during hospitalization 09/2022 in the  setting of low magnesium -I will get a 2-week Zio patch to rule out any other silent arrhythmias -He denies any palpitations, dizziness or syncope  4.  Hypertension -BP controlled on exam -Continue prescription drug management with amlodipine 2.5 mg daily, irbesartan 150/12.5 mg daily with as needed refills  5.  TIA -? Etiology -Head CT and MRI were normal -will get 2 week Zio and if normal refer to EP for ILR  Time Spent: 20 minutes total time of encounter, including 15 minutes spent in face-to-face patient care on the date of this encounter. This time includes coordination of care and counseling regarding above mentioned problem list. Remainder of non-face-to-face time involved reviewing chart documents/testing relevant to the patient encounter and documentation in the medical record. I have independently reviewed documentation from referring provider  Medication Adjustments/Labs and Tests Ordered: Current medicines are reviewed at length with the patient today.  Concerns regarding medicines are outlined above.  Medication changes, Labs and Tests ordered today are listed in the Patient Instructions below.  There are no Patient Instructions on file for this visit.   Signed, Fransico Him, MD  12/26/2022 9:08 AM    [facility] [place], [place], [place]  [address] Phone: [phone]; Fax: [phone]

## 2022-12-26 NOTE — Progress Notes (Unsigned)
Enrolled for [employer] to mail a ZIO XT long term holter monitor to the patients address on file.  

## 2022-12-26 NOTE — Addendum Note (Signed)
Addended by: Joni Reining on: 12/26/2022 09:14 AM   Modules accepted: Orders

## 2023-01-01 DIAGNOSIS — I4729 Other ventricular tachycardia: Secondary | ICD-10-CM

## 2023-01-01 DIAGNOSIS — G459 Transient cerebral ischemic attack, unspecified: Secondary | ICD-10-CM

## 2023-01-01 DIAGNOSIS — R001 Bradycardia, unspecified: Secondary | ICD-10-CM | POA: Diagnosis not present

## 2023-01-07 ENCOUNTER — Inpatient Hospital Stay: Payer: Medicare Other | Admitting: Family Medicine

## 2023-01-21 DIAGNOSIS — H02834 Dermatochalasis of left upper eyelid: Secondary | ICD-10-CM | POA: Diagnosis not present

## 2023-01-21 DIAGNOSIS — H10501 Unspecified blepharoconjunctivitis, right eye: Secondary | ICD-10-CM | POA: Diagnosis not present

## 2023-01-21 DIAGNOSIS — H02831 Dermatochalasis of right upper eyelid: Secondary | ICD-10-CM | POA: Diagnosis not present

## 2023-01-23 DIAGNOSIS — G459 Transient cerebral ischemic attack, unspecified: Secondary | ICD-10-CM | POA: Diagnosis not present

## 2023-01-23 DIAGNOSIS — R001 Bradycardia, unspecified: Secondary | ICD-10-CM | POA: Diagnosis not present

## 2023-01-24 ENCOUNTER — Telehealth: Payer: Self-pay

## 2023-01-24 ENCOUNTER — Encounter: Payer: Self-pay | Admitting: Cardiology

## 2023-01-24 DIAGNOSIS — I493 Ventricular premature depolarization: Secondary | ICD-10-CM | POA: Insufficient documentation

## 2023-01-24 DIAGNOSIS — I10 Essential (primary) hypertension: Secondary | ICD-10-CM

## 2023-01-24 DIAGNOSIS — I5189 Other ill-defined heart diseases: Secondary | ICD-10-CM

## 2023-01-24 DIAGNOSIS — I4729 Other ventricular tachycardia: Secondary | ICD-10-CM

## 2023-01-24 NOTE — Telephone Encounter (Signed)
Spoke to the patient explained Dr. Theodosia Blender recommendations.Pt is scheduled on 2/26 for BMET,Mag, and TSH. Placed order for caridac MRI with/without contrast, PET CTA. The patient has been notified of the result and verbalized understanding.  All questions (if any) were answered. Gershon Crane, LPN [id number] PM

## 2023-01-28 ENCOUNTER — Ambulatory Visit: Payer: Medicare Other | Attending: Cardiology

## 2023-01-28 ENCOUNTER — Telehealth: Payer: Self-pay

## 2023-01-28 DIAGNOSIS — H10411 Chronic giant papillary conjunctivitis, right eye: Secondary | ICD-10-CM | POA: Diagnosis not present

## 2023-01-28 DIAGNOSIS — I10 Essential (primary) hypertension: Secondary | ICD-10-CM

## 2023-01-28 NOTE — Telephone Encounter (Signed)
Opened in error; Labs, Pet CTA, and Cardiac MRI scheduled.

## 2023-01-28 NOTE — Telephone Encounter (Signed)
-----   Message from Joni Reining, RN sent at 01/25/2023  6:28 PM EST -----  ----- Message ----- From: Frederic Jericho Sent: 01/25/2023   1:21 PM EST To: Joni Reining, RN  This was sent to my Inbasket.. not sure if that was done in error or not. Cardiac PET CT has been scheduled.

## 2023-01-29 ENCOUNTER — Telehealth: Payer: Self-pay

## 2023-01-29 LAB — BASIC METABOLIC PANEL
BUN/Creatinine Ratio: 19 (ref 10–24)
BUN: 26 mg/dL (ref 8–27)
CO2: 23 mmol/L (ref 20–29)
Calcium: 10.5 mg/dL — ABNORMAL HIGH (ref 8.6–10.2)
Chloride: 102 mmol/L (ref 96–106)
Creatinine, Ser: 1.37 mg/dL — ABNORMAL HIGH (ref 0.76–1.27)
Glucose: 105 mg/dL — ABNORMAL HIGH (ref 70–99)
Potassium: 4.5 mmol/L (ref 3.5–5.2)
Sodium: 142 mmol/L (ref 134–144)
eGFR: 52 mL/min/{1.73_m2} — ABNORMAL LOW (ref >59)

## 2023-01-29 LAB — TSH: TSH: 1.77 u[IU]/mL (ref 0.450–4.500)

## 2023-01-29 LAB — MAGNESIUM: Magnesium: 1.6 mg/dL (ref 1.6–2.3)

## 2023-01-29 MED ORDER — IRBESARTAN 150 MG PO TABS: 150.0000 mg | ORAL_TABLET | Freq: Every day | ORAL | 3 refills | Status: DC

## 2023-01-29 NOTE — Addendum Note (Signed)
Addended by: Callie Fielding D on: 01/29/2023 08:51 AM   Modules accepted: Orders

## 2023-01-29 NOTE — Addendum Note (Signed)
Addended by: Callie Fielding D on: 01/29/2023 08:59 AM   Modules accepted: Orders

## 2023-01-29 NOTE — Telephone Encounter (Signed)
Patient is scheduled for repeat BMET on 02/05/23. The patient has been notified of the result and verbalized understanding.  All questions (if any) were answered. Gershon Crane, LPN [phone] AM

## 2023-01-29 NOTE — Telephone Encounter (Signed)
Shared Decision Making/Informed Consent The risks [chest pain, shortness of breath, cardiac arrhythmias, dizziness, blood pressure fluctuations, myocardial infarction, stroke/transient ischemic attack, nausea, vomiting, allergic reaction, radiation exposure, metallic taste sensation and life-threatening complications (estimated to be 1 in 10,000)], benefits (risk stratification, diagnosing coronary artery disease, treatment guidance) and alternatives of a cardiac PET stress test were discussed in detail with Jay Bailey and he agrees to proceed.

## 2023-01-31 DIAGNOSIS — I10 Essential (primary) hypertension: Secondary | ICD-10-CM | POA: Diagnosis not present

## 2023-01-31 DIAGNOSIS — E785 Hyperlipidemia, unspecified: Secondary | ICD-10-CM | POA: Diagnosis not present

## 2023-01-31 DIAGNOSIS — I739 Peripheral vascular disease, unspecified: Secondary | ICD-10-CM | POA: Diagnosis not present

## 2023-01-31 DIAGNOSIS — E1140 Type 2 diabetes mellitus with diabetic neuropathy, unspecified: Secondary | ICD-10-CM | POA: Diagnosis not present

## 2023-01-31 DIAGNOSIS — E6609 Other obesity due to excess calories: Secondary | ICD-10-CM | POA: Diagnosis not present

## 2023-02-04 ENCOUNTER — Ambulatory Visit (INDEPENDENT_AMBULATORY_CARE_PROVIDER_SITE_OTHER): Payer: Medicare Other | Admitting: Podiatry

## 2023-02-04 ENCOUNTER — Encounter: Payer: Self-pay | Admitting: Podiatry

## 2023-02-04 ENCOUNTER — Telehealth (HOSPITAL_COMMUNITY): Payer: Self-pay | Admitting: *Deleted

## 2023-02-04 DIAGNOSIS — E119 Type 2 diabetes mellitus without complications: Secondary | ICD-10-CM | POA: Diagnosis not present

## 2023-02-04 DIAGNOSIS — M79676 Pain in unspecified toe(s): Secondary | ICD-10-CM

## 2023-02-04 DIAGNOSIS — B351 Tinea unguium: Secondary | ICD-10-CM | POA: Diagnosis not present

## 2023-02-04 NOTE — Telephone Encounter (Signed)
Reaching out to patient to offer assistance regarding upcoming cardiac imaging study; pt verbalizes understanding of appt date/time, parking situation and where to check in, pre-test NPO status and verified current allergies; name and call back number provided for further questions should they arise  Demarian Epps RN Navigator Cardiac Imaging [facility] [phone] office [phone] cell  Patient aware to avoid caffeine 12 hours prior to his cardiac PET scan. 

## 2023-02-04 NOTE — Progress Notes (Signed)
This patient returns to my office for at risk foot care.  This patient requires this care by a professional since this patient will be at risk due to having  Diabetes.   This patient is unable to cut nails himself since the patient cannot reach his nails.These nails are painful walking and wearing shoes.  Patient is also concerned about swelling in his feet. He says there is no pain in his swollen feet.   This patient presents for at risk foot care today.  General Appearance  Alert, conversant and in no acute stress.  Vascular  Dorsalis pedis and posterior tibial  pulses are weakly  palpable  bilaterally.  Capillary return is within normal limits  bilaterally. Temperature is within normal limits  Bilaterally.  Venous stasis both legs.  Swelling feet.  Neurologic  Senn-Weinstein monofilament wire test within normal limits  bilaterally. Muscle power within normal limits bilaterally.  Nails Thick disfigured discolored nails with subungual debris  from hallux to fifth toes bilaterally. No evidence of bacterial infection or drainage bilaterally.  Orthopedic  No limitations of motion  feet .  No crepitus or effusions noted.  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  Prominent metatarsal heads  B/L.  Skin  normotropic skin with no porokeratosis noted bilaterally.  No signs of infections or ulcers noted.     Onychomycosis  Pain in right toes  Pain in left toes  Consent was obtained for treatment procedures.   Mechanical debridement of nails 1-5  bilaterally performed with a nail nipper.  Filed with dremel without incident.     Return office visit    10 weeks                  Told patient to return for periodic foot care and evaluation due to potential at risk complications.   Gardiner Barefoot DPM

## 2023-02-05 ENCOUNTER — Ambulatory Visit: Payer: Medicare Other | Attending: Cardiology

## 2023-02-05 DIAGNOSIS — I10 Essential (primary) hypertension: Secondary | ICD-10-CM | POA: Diagnosis not present

## 2023-02-06 ENCOUNTER — Ambulatory Visit (HOSPITAL_COMMUNITY)
Admission: RE | Admit: 2023-02-06 | Discharge: 2023-02-06 | Disposition: A | Discharge status: Home / Self Care | Payer: Medicare Other | Source: Ambulatory Visit | Attending: Cardiology | Admitting: Cardiology

## 2023-02-06 ENCOUNTER — Telehealth: Payer: Self-pay | Admitting: Cardiology

## 2023-02-06 DIAGNOSIS — I4729 Other ventricular tachycardia: Secondary | ICD-10-CM | POA: Diagnosis not present

## 2023-02-06 DIAGNOSIS — I10 Essential (primary) hypertension: Secondary | ICD-10-CM

## 2023-02-06 DIAGNOSIS — I493 Ventricular premature depolarization: Secondary | ICD-10-CM | POA: Diagnosis not present

## 2023-02-06 DIAGNOSIS — I5189 Other ill-defined heart diseases: Secondary | ICD-10-CM

## 2023-02-06 LAB — BASIC METABOLIC PANEL
BUN/Creatinine Ratio: 19 (ref 10–24)
BUN: 24 mg/dL (ref 8–27)
CO2: 22 mmol/L (ref 20–29)
Calcium: 9.7 mg/dL (ref 8.6–10.2)
Chloride: 104 mmol/L (ref 96–106)
Creatinine, Ser: 1.26 mg/dL (ref 0.76–1.27)
Glucose: 103 mg/dL — ABNORMAL HIGH (ref 70–99)
Potassium: 4.3 mmol/L (ref 3.5–5.2)
Sodium: 140 mmol/L (ref 134–144)
eGFR: 58 mL/min/{1.73_m2} — ABNORMAL LOW (ref >59)

## 2023-02-06 LAB — NM PET CT CARDIAC PERFUSION MULTI W/ABSOLUTE BLOODFLOW: Nuc Stress EF: 6 %

## 2023-02-06 LAB — MAGNESIUM: Magnesium: 1.6 mg/dL (ref 1.6–2.3)

## 2023-02-06 MED ORDER — RUBIDIUM RB82 GENERATOR (RUBYFILL)
22.8000 | PACK | Freq: Once | INTRAVENOUS | Status: AC
  Administered 2023-02-06: 22.8 via INTRAVENOUS

## 2023-02-06 MED ORDER — RUBIDIUM RB82 GENERATOR (RUBYFILL)
23.0000 | PACK | Freq: Once | INTRAVENOUS | Status: AC
  Administered 2023-02-06: 23 via INTRAVENOUS

## 2023-02-06 MED ORDER — REGADENOSON 0.4 MG/5ML IV SOLN
0.4000 mg | Freq: Once | INTRAVENOUS | Status: AC
  Administered 2023-02-06: 0.4 mg via INTRAVENOUS

## 2023-02-06 NOTE — Telephone Encounter (Signed)
Called patient to let him know results have not been reviewed by physician yet, patient verbalizes understanding. Assured patient we would call with recommendations as soon as possible.

## 2023-02-06 NOTE — Telephone Encounter (Signed)
Patient is requesting a call back to discuss lab work.

## 2023-02-07 LAB — NM PET CT CARDIAC PERFUSION MULTI W/ABSOLUTE BLOODFLOW
LV dias vol: 80 mL (ref 62–150)
LV sys vol: 60 mL
MBFR: 2.58
Rest MBF: 0.74 ml/g/min
Rest Nuclear Isotope Dose: 23 mCi
ST Depression (mm): 0 mm
Stress MBF: 1.91 ml/g/min
Stress Nuclear Isotope Dose: 22.8 mCi
TID: 1.19

## 2023-02-08 ENCOUNTER — Telehealth: Payer: Self-pay

## 2023-02-08 ENCOUNTER — Telehealth: Payer: Self-pay | Admitting: Cardiology

## 2023-02-08 DIAGNOSIS — Z79899 Other long term (current) drug therapy: Secondary | ICD-10-CM

## 2023-02-08 NOTE — Telephone Encounter (Signed)
-----   Message from Freada Bergeron, MD sent at 02/07/2023  9:15 PM EST ----- Just want to ensure we clarify his medications. Is he taking just irbesartan or irebesartan-HCTZ? Just want to ensure we are not taking both.   His kidney function looks improved from prior but I do want to make sure he is not on both meds as above. His magnesium remains low. Is he taking a magnesium supplement? If not, would recommend starting magnesium 400mg  daily with a repeat Mg level in 10-14 days for monitoring.

## 2023-02-08 NOTE — Telephone Encounter (Signed)
Follow Up:       The pharmacist is calling to see exactly what Magnesium, patient is supposed to be  taking?

## 2023-02-08 NOTE — Telephone Encounter (Signed)
Lab ordered.

## 2023-02-08 NOTE — Telephone Encounter (Signed)
Spoke with [place] at [facility] to confirm patient needs '400mg'$  of magnesium supplement.

## 2023-02-08 NOTE — Telephone Encounter (Signed)
Pt c/o medication issue:  1. Name of Medication: Magnesium Supplement  2. How are you currently taking this medication (dosage and times per day)?   3. Are you having a reaction (difficulty breathing--STAT)? No  4. What is your medication issue? Pt was recommended to start taking a magnesium supplement. Pt is requesting for the nurse to call [facility] at [phone] to inform them of what the pt is needing to take and how much. Please advise.

## 2023-02-08 NOTE — Telephone Encounter (Signed)
Patient is aware of lab results and he stated that he is only taking the irbesartan '150mg'$ . He also stated he was not aware he was supposed to take a magnesium supplement. He will start magnesium supplement today. Lab appointment scheduled as well.

## 2023-02-11 DIAGNOSIS — Z6830 Body mass index (BMI) 30.0-30.9, adult: Secondary | ICD-10-CM | POA: Diagnosis not present

## 2023-02-11 DIAGNOSIS — I10 Essential (primary) hypertension: Secondary | ICD-10-CM | POA: Diagnosis not present

## 2023-02-11 DIAGNOSIS — E785 Hyperlipidemia, unspecified: Secondary | ICD-10-CM | POA: Diagnosis not present

## 2023-02-11 DIAGNOSIS — E1140 Type 2 diabetes mellitus with diabetic neuropathy, unspecified: Secondary | ICD-10-CM | POA: Diagnosis not present

## 2023-02-11 DIAGNOSIS — E6609 Other obesity due to excess calories: Secondary | ICD-10-CM | POA: Diagnosis not present

## 2023-02-12 ENCOUNTER — Telehealth: Payer: Self-pay | Admitting: Cardiology

## 2023-02-12 MED FILL — Regadenoson IV Inj 0.4 MG/5ML (0.08 MG/ML): INTRAVENOUS | Qty: 5 | Status: AC

## 2023-02-12 NOTE — Telephone Encounter (Signed)
Pt call received in [facility] triage.  Pt states he received call regarding his NM PET cardiac CT scan, and usually sees Dr. Fransico Him.  He wanted to know the results.  I shared the results as stated below from Dr. Gasper Sells:   Results: Mild LCX ischemia with PVCs Plan: While Dr. Radford Pax is out, would bring back to see me to discuss   Werner Lean, MD  Per [facility] RN, she wanted to know if Pt was symptomatic?  Pt states that he feels good, and after sharing multiple cardiac related symptoms, Pt states that he has NO symptoms.    Pt wanted an appointment to meet with Dr. Gasper Sells, and wanted something on April 1 to discuss these test results.  Per Pt request, I scheduled Mr. Ridenhour for a 1030 am, 03/04/2023 appointment.  Pt appreciated Dr. Gasper Sells meeting with him.

## 2023-02-12 NOTE — Telephone Encounter (Signed)
Patient returned RN's call. 

## 2023-02-14 DIAGNOSIS — C61 Malignant neoplasm of prostate: Secondary | ICD-10-CM | POA: Diagnosis not present

## 2023-02-14 DIAGNOSIS — R972 Elevated prostate specific antigen [PSA]: Secondary | ICD-10-CM | POA: Diagnosis not present

## 2023-02-15 ENCOUNTER — Telehealth: Payer: Self-pay | Admitting: Cardiology

## 2023-02-15 NOTE — Telephone Encounter (Signed)
Spoke with the patient and scheduled him for lab work.  °

## 2023-02-15 NOTE — Telephone Encounter (Signed)
Pt is returning call about his PET CT results. Pt also wants to know if they need to go get blood work every few weeks or so. Please advise

## 2023-02-19 DIAGNOSIS — H10413 Chronic giant papillary conjunctivitis, bilateral: Secondary | ICD-10-CM | POA: Diagnosis not present

## 2023-02-21 ENCOUNTER — Ambulatory Visit: Payer: Medicare Other | Attending: Family Medicine

## 2023-02-25 DIAGNOSIS — E785 Hyperlipidemia, unspecified: Secondary | ICD-10-CM | POA: Diagnosis not present

## 2023-02-25 DIAGNOSIS — I10 Essential (primary) hypertension: Secondary | ICD-10-CM | POA: Diagnosis not present

## 2023-02-25 DIAGNOSIS — E1140 Type 2 diabetes mellitus with diabetic neuropathy, unspecified: Secondary | ICD-10-CM | POA: Diagnosis not present

## 2023-02-27 ENCOUNTER — Telehealth: Payer: Self-pay

## 2023-02-27 ENCOUNTER — Ambulatory Visit: Payer: Medicare Other

## 2023-02-27 DIAGNOSIS — I10 Essential (primary) hypertension: Secondary | ICD-10-CM

## 2023-02-27 MED ORDER — AMLODIPINE BESYLATE 5 MG PO TABS: 5.0000 mg | ORAL_TABLET | Freq: Every day | ORAL | 2 refills | Status: DC

## 2023-02-27 MED ORDER — IRBESARTAN 150 MG PO TABS: 300.0000 mg | ORAL_TABLET | Freq: Every day | ORAL | 3 refills | Status: DC

## 2023-02-27 NOTE — Telephone Encounter (Signed)
Pt arrived on the wrong day for Lab drawn appointment.    Darryl CMA checked Pt BP per request of the Pt.  Pt was asymptomatic;  Pt BP was 190/80 in the Right Arm.  Pt was taken to Pod C, room 1.  The BP was checked in the left arm and was 196/80, HR 64, R 20, O2 99%  DOD, Dr. Ali Lowe consulted, and Amlodipine increased to 5 mg daily, sent to pt pharmacy, and Irbesartan increased to 300 mg daily, sent to pharmacy, to keep Pt from having to go to the ER.   Follow up [facility] appointment scheduled with Nicholes Rough PA-C on 03/08/2023, Hypertension follow up, 245 pm, per Dr. Loni Muse Thukkani's order.    ================================  Pt was educated on what Dr. Ali Lowe ordered: Amlodipine 5 mg once daily. Irbesartan 300 mg, daily.   Pt took 1 irbesartan this morning at home, so I had him take another 150 mg tablet in clinic.  His BP dropped to 160/78;  Pt prescriptions were sent into [facility] in [place] this evening;  Pt advised to take 1 ( 5 mg ) Amlodipine tablet this evening.  Pt then advised to assess his BP in the morning.  If it is normal, to wait until late morning, check his BP again, then take the prescribed medications as ordered.   Pt then advised to recheck his BP 2 hours after taking his medicine.  Pt advised to call [facility]Care with questions, new symptoms, or any concerns regarding his BP.  Pt understood what was taught, and understood he needs to f/u with Ms. Conte PA-C on 03/08/2023.

## 2023-02-28 ENCOUNTER — Ambulatory Visit: Payer: Medicare Other | Attending: Cardiology

## 2023-02-28 DIAGNOSIS — Z79899 Other long term (current) drug therapy: Secondary | ICD-10-CM | POA: Diagnosis not present

## 2023-03-01 ENCOUNTER — Telehealth: Payer: Self-pay | Admitting: Cardiology

## 2023-03-01 LAB — MAGNESIUM: Magnesium: 2.1 mg/dL (ref 1.6–2.3)

## 2023-03-01 NOTE — Telephone Encounter (Signed)
  Pt is calling to get lab result  

## 2023-03-01 NOTE — Telephone Encounter (Signed)
Spoke with patient and discussed lab results.  Per Dr. Johney Frame: Magnesium looks much better. No changes in medications.   Patient verbalized understanding and expressed appreciation for call.

## 2023-03-04 ENCOUNTER — Ambulatory Visit: Payer: Medicare Other | Admitting: Internal Medicine

## 2023-03-08 ENCOUNTER — Ambulatory Visit: Payer: Medicare Other | Attending: Physician Assistant | Admitting: Physician Assistant

## 2023-03-08 ENCOUNTER — Encounter: Payer: Self-pay | Admitting: Physician Assistant

## 2023-03-08 VITALS — BP 120/80 | HR 68 | Ht 67.0 in | Wt 193.8 lb

## 2023-03-08 DIAGNOSIS — I5189 Other ill-defined heart diseases: Secondary | ICD-10-CM | POA: Diagnosis not present

## 2023-03-08 DIAGNOSIS — G459 Transient cerebral ischemic attack, unspecified: Secondary | ICD-10-CM | POA: Insufficient documentation

## 2023-03-08 DIAGNOSIS — I10 Essential (primary) hypertension: Secondary | ICD-10-CM | POA: Diagnosis not present

## 2023-03-08 DIAGNOSIS — I493 Ventricular premature depolarization: Secondary | ICD-10-CM | POA: Diagnosis not present

## 2023-03-08 DIAGNOSIS — I4729 Other ventricular tachycardia: Secondary | ICD-10-CM | POA: Insufficient documentation

## 2023-03-08 DIAGNOSIS — R001 Bradycardia, unspecified: Secondary | ICD-10-CM

## 2023-03-08 MED ORDER — ISOSORBIDE MONONITRATE ER 30 MG PO TB24: 30.0000 mg | ORAL_TABLET | Freq: Every day | ORAL | 3 refills | Status: DC

## 2023-03-08 NOTE — Progress Notes (Signed)
Office Visit    Patient Name: Jay Bailey Date of Encounter: 03/08/2023  PCP:  Laurann Montana, MD   [facility]  Cardiologist:  Armanda Magic, MD  Advanced Practice Provider:  No care team member to display Electrophysiologist:  None   HPI    Jay Bailey is a 82 y.o. male with a past medical history of bradycardia, hypertension, type 2 diabetes mellitus, hyperlipidemia, TIA and NSVT in the setting of hypomagnesemia presents today for follow-up visit.  Patient history includes 2D echocardiogram that showed EF 50% with grade 1 DD.  He was seen 12/26/2022 by Dr. Mayford Knife and at that time was doing well.  Denied any chest pain/pressure, SOB, DOE, PND, orthopnea, lower extremity edema, dizziness, palpitation, or syncope.  Compliant with medications.  Today, he states that he came in for lab work and his blood pressure was really high 190 systolic.  He does have some cognitive issues at baseline.  His father had a cardiac history.  We went through his PET CT scan today together and I also reviewed with Dr. Izora Ribas.  Ultimately, it was decided to hold off on cardiac catheterization and treat him medically.  We added Imdur today.  He also set up for a cardiac MRI in a month.  He has always been asymptomatic and has not had any chest pain.  PET scan was originally ordered for frequent PVCs (load 10%).  Reports no shortness of breath nor dyspnea on exertion. Reports no chest pain, pressure, or tightness. No edema, orthopnea, PND. Reports no palpitations.    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   Cancer    Congenital deformity of ankle joint    Depression    a long time ago    Diabetes mellitus without complication    type 2   Hyperlipidemia    Hypertension    Inguinal hernia    Prostate cancer    PVC's  (premature ventricular contractions)    PVC load 10.6% with NSVT up to 5 beats on heart monitor 01/2023   Skin cancer (melanoma)    historical    Wears hearing aid    bialteral    Past Surgical History:  Procedure Laterality Date   INGUINAL HERNIA REPAIR     right and left hernias  miltiple times with mesh placement    INGUINAL HERNIA REPAIR Left 12/26/2018   Procedure: OPEN LEFT INGUINAL HERNIA WITH MESH;  Surgeon: Luretha Murphy, MD;  Location: [facility];  Service: General;  Laterality: Left;   nose myeloma     PROSTATECTOMY  2005   TOTAL HIP ARTHROPLASTY  "long time ago "   bilateral ; [place]     Allergies  Allergies  Allergen Reactions   Lisinopril Cough and Other (See Comments)    Other reaction(s): cough     EKGs/Labs/Other Studies Reviewed:   The following studies were reviewed today:  Cardiac monitor 12/26/22   Predominant rhythm was sinus bradycardia with an average heart rate of 59 bpm and ranged from 42 to 103 bpm.   Frequent PVCs, ventricular couplets, ventricular triplets, ventricular bigeminy and trigeminy with PVC load 10.6%   4 episodes of nonsustained VT with the longest episode lasting 5 beats and fastest episode 143/min  EKG:  EKG is  not ordered today.   Recent Labs: 09/14/2022: ALT 29; B Natriuretic Peptide 70.5; Hemoglobin 13.6; Platelets 180 01/28/2023: TSH 1.770 02/05/2023: BUN 24; Creatinine, Ser 1.26; Potassium 4.3; Sodium 140 02/28/2023: Magnesium 2.1  Recent Lipid Panel    Component Value Date/Time   CHOL 139 09/16/2022 0434   TRIG 218 (H) 09/16/2022 0434   HDL 36 (L) 09/16/2022 0434   CHOLHDL 3.9 09/16/2022 0434   VLDL 44 (H) 09/16/2022 0434   LDLCALC 59 09/16/2022 0434   Home Medications   Current Meds  Medication Sig   ACCU-CHEK GUIDE test strip    amLODipine (NORVASC) 5 MG tablet Take 1 tablet (5 mg total) by mouth daily.   ascorbic acid (VITAMIN C) 1000 MG tablet Take 1,000 mg by mouth daily.   aspirin EC 81 MG tablet Take 81 mg by  mouth. Per patient taking 2 tablets daily   atorvastatin (LIPITOR) 80 MG tablet Take 80 mg by mouth daily.   buPROPion (WELLBUTRIN XL) 150 MG 24 hr tablet Take 150 mg by mouth daily.   folic acid (FOLVITE) 400 MCG tablet Take 400 mcg by mouth daily.    irbesartan (AVAPRO) 150 MG tablet Take 2 tablets (300 mg total) by mouth daily.   isosorbide mononitrate (IMDUR) 30 MG 24 hr tablet Take 1 tablet (30 mg total) by mouth daily.   Multiple Vitamin (MULTIVITAMIN) tablet Take 1 tablet by mouth daily.   oxybutynin (DITROPAN-XL) 5 MG 24 hr tablet Take 5 mg by mouth daily.   vitamin E 400 UNIT capsule Take 400 Units by mouth daily.     Review of Systems      All other systems reviewed and are otherwise negative except as noted above.  Physical Exam    VS:  BP 120/80   Pulse 68   Ht 5\' 7"  (1.702 m)   Wt 193 lb 12.8 oz (87.9 kg)   SpO2 94%   BMI 30.35 kg/m  , BMI Body mass index is 30.35 kg/m.  Wt Readings from Last 3 Encounters:  03/08/23 193 lb 12.8 oz (87.9 kg)  12/26/22 192 lb 9.6 oz (87.4 kg)  09/15/22 220 lb 7.4 oz (100 kg)     GEN: Well nourished, well developed, in no acute distress. HEENT: normal. Neck: Supple, no JVD, carotid bruits, or masses. Cardiac: RRR, no murmurs, rubs, or gallops. No clubbing, cyanosis, edema.  Radials/PT 2+ and equal bilaterally.  Respiratory:  Respirations regular and unlabored, clear to auscultation bilaterally. GI: Soft, nontender, nondistended. MS: No deformity or atrophy. Skin: Warm and dry, no rash. Neuro:  Strength and sensation are intact. Psych: Normal affect.  Assessment & Plan    NM PET/CT positive for ischemia -discussed with Dr. Izora Ribashandrasekhar and decided to ultimately treat medically since he is not having any symptoms at this time -started on Imdur 30mg  daily -set up already for cardiac MRI  Bradycardia -NSR, HR is in the 60s today -avoiding nodal agents   Diastolic dysfunction -He is having a small amount of lower  extremity edema, no shortness of breath with -May benefit from HCTZ blood pressure remains elevated (will also treat some lower extremity edema  NSVT - no palpitations  -Continue current medication regimen  Hypertension -BP check and bring in new cuff  -continue to monitor at home  TIA -had some hot flashes, no numbness or weakness -no recurrent incidents -on ASA    Disposition: Follow up after cardiac MRI  with Armanda Magicraci Turner, MD or APP.  Signed, Sharlene Doryessa N Kohlton Gilpatrick,  PA-C 03/08/2023, 5:17 PM [facility]

## 2023-03-08 NOTE — Patient Instructions (Addendum)
Medication Instructions:  Start Isosorbide (Imdur) 30 mg daily   *If you need a refill on your cardiac medications before your next appointment, please call your pharmacy*   Lab Work: None ordered   If you have labs (blood work) drawn today and your tests are completely normal, you will receive your results only by: MyChart Message (if you have MyChart) OR A paper copy in the mail If you have any lab test that is abnormal or we need to change your treatment, we will call you to review the results.   Testing/Procedures: None ordered    Follow-Up: At [facility], you and your health needs are our priority.  As part of our continuing mission to provide you with exceptional heart care, we have created designated Provider Care Teams.  These Care Teams include your primary Cardiologist (physician) and Advanced Practice Providers (APPs -  Physician Assistants and Nurse Practitioners) who all work together to provide you with the care you need, when you need it.  We recommend signing up for the patient portal called "MyChart".  Sign up information is provided on this After Visit Summary.  MyChart is used to connect with patients for Virtual Visits (Telemedicine).  Patients are able to view lab/test results, encounter notes, upcoming appointments, etc.  Non-urgent messages can be sent to your provider as well.   To learn more about what you can do with MyChart, go to [email or web address].    Your next appointment:   3 month(s)  Provider:   Armanda Magic, MD     Other Instructions Check your blood pressure daily for 2 weeks and send [place] a mychart message of your readings or call our office.   Heart-Healthy Eating Plan Eating a healthy diet is important for the health of your heart. A heart-healthy eating plan includes: Eating less unhealthy fats. Eating more healthy fats. Eating less salt in your food. Salt is also called sodium. Making other changes in your diet. Talk with  your doctor or a diet specialist (dietitian) to create an eating plan that is right for you. What is my plan? Your doctor may recommend an eating plan that includes: Total fat: ______% or less of total calories a day. Saturated fat: ______% or less of total calories a day. Cholesterol: less than _________mg a day. Sodium: less than _________mg a day. What are tips for following this plan? Cooking Avoid frying your food. Try to bake, boil, grill, or broil it instead. You can also reduce fat by: Removing the skin from poultry. Removing all visible fats from meats. Steaming vegetables in water or broth. Meal planning  At meals, divide your plate into four equal parts: Fill one-half of your plate with vegetables and green salads. Fill one-fourth of your plate with whole grains. Fill one-fourth of your plate with lean protein foods. Eat 2-4 cups of vegetables per day. One cup of vegetables is: 1 cup (91 g) broccoli or cauliflower florets. 2 medium carrots. 1 large bell pepper. 1 large sweet potato. 1 large tomato. 1 medium white potato. 2 cups (150 g) raw leafy greens. Eat 1-2 cups of fruit per day. One cup of fruit is: 1 small apple 1 large banana 1 cup (237 g) mixed fruit, 1 large orange,  cup (82 g) dried fruit, 1 cup (240 mL) 100% fruit juice. Eat more foods that have soluble fiber. These are apples, broccoli, carrots, beans, peas, and barley. Try to get 20-30 g of fiber per day. Eat 4-5 servings of nuts,  legumes, and seeds per week: 1 serving of dried beans or legumes equals  cup (90 g) cooked. 1 serving of nuts is  oz (12 almonds, 24 pistachios, or 7 walnut halves). 1 serving of seeds equals  oz (8 g). General information Eat more home-cooked food. Eat less restaurant, buffet, and fast food. Limit or avoid alcohol. Limit foods that are high in starch and sugar. Avoid fried foods. Lose weight if you are overweight. Keep track of how much salt (sodium) you eat. This  is important if you have high blood pressure. Ask your doctor to tell you more about this. Try to add vegetarian meals each week. Fats Choose healthy fats. These include olive oil and canola oil, flaxseeds, walnuts, almonds, and seeds. Eat more omega-3 fats. These include salmon, mackerel, sardines, tuna, flaxseed oil, and ground flaxseeds. Try to eat fish at least 2 times each week. Check food labels. Avoid foods with trans fats or high amounts of saturated fat. Limit saturated fats. These are often found in animal products, such as meats, butter, and cream. These are also found in plant foods, such as palm oil, palm kernel oil, and coconut oil. Avoid foods with partially hydrogenated oils in them. These have trans fats. Examples are stick margarine, some tub margarines, cookies, crackers, and other baked goods. What foods should I eat? Fruits All fresh, canned (in natural juice), or frozen fruits. Vegetables Fresh or frozen vegetables (raw, steamed, roasted, or grilled). Green salads. Grains Most grains. Choose whole wheat and whole grains most of the time. Rice and pasta, including brown rice and pastas made with whole wheat. Meats and other proteins Lean, well-trimmed beef, veal, pork, and lamb. Chicken and [place] without skin. All fish and shellfish. Wild duck, rabbit, pheasant, and venison. Egg whites or low-cholesterol egg substitutes. Dried beans, peas, lentils, and tofu. Seeds and most nuts. Dairy Low-fat or nonfat cheeses, including ricotta and mozzarella. Skim or 1% milk that is liquid, powdered, or evaporated. Buttermilk that is made with low-fat milk. Nonfat or low-fat yogurt. Fats and oils Non-hydrogenated (trans-free) margarines. Vegetable oils, including soybean, sesame, sunflower, olive, peanut, safflower, corn, canola, and cottonseed. Salad dressings or mayonnaise made with a vegetable oil. Beverages Mineral water. Coffee and tea. Diet carbonated beverages. Sweets and  desserts Sherbet, gelatin, and fruit ice. Small amounts of dark chocolate. Limit all sweets and desserts. Seasonings and condiments All seasonings and condiments. The items listed above may not be a complete list of foods and drinks you can eat. Contact a dietitian for more options. What foods should I avoid? Fruits Canned fruit in heavy syrup. Fruit in cream or butter sauce. Fried fruit. Limit coconut. Vegetables Vegetables cooked in cheese, cream, or butter sauce. Fried vegetables. Grains Breads that are made with saturated or trans fats, oils, or whole milk. Croissants. Sweet rolls. Donuts. High-fat crackers, such as cheese crackers. Meats and other proteins Fatty meats, such as hot dogs, ribs, sausage, bacon, rib-eye roast or steak. High-fat deli meats, such as salami and bologna. Caviar. Domestic duck and goose. Organ meats, such as liver. Dairy Cream, sour cream, cream cheese, and creamed cottage cheese. Whole-milk cheeses. Whole or 2% milk that is liquid, evaporated, or condensed. Whole buttermilk. Cream sauce or high-fat cheese sauce. Yogurt that is made from whole milk. Fats and oils Meat fat, or shortening. Cocoa butter, hydrogenated oils, palm oil, coconut oil, palm kernel oil. Solid fats and shortenings, including bacon fat, salt pork, lard, and butter. Nondairy cream substitutes. Salad dressings with cheese  or sour cream. Beverages Regular sodas and juice drinks with added sugar. Sweets and desserts Frosting. Pudding. Cookies. Cakes. Pies. Milk chocolate or white chocolate. Buttered syrups. Full-fat ice cream or ice cream drinks. The items listed above may not be a complete list of foods and drinks to avoid. Contact a dietitian for more information. Summary Heart-healthy meal planning includes eating less unhealthy fats, eating more healthy fats, and making other changes in your diet. Eat a balanced diet. This includes fruits and vegetables, low-fat or nonfat dairy, lean  protein, nuts and legumes, whole grains, and heart-healthy oils and fats. This information is not intended to replace advice given to you by your health care provider. Make sure you discuss any questions you have with your health care provider. Document Revised: 12/25/2021 Document Reviewed: 12/25/2021 Elsevier Patient Education  2023 Elsevier Inc.   Low-Sodium Eating Plan Sodium, which is an element that makes up salt, helps you maintain a healthy balance of fluids in your body. Too much sodium can increase your blood pressure and cause fluid and waste to be held in your body. Your health care provider or dietitian may recommend following this plan if you have high blood pressure (hypertension), kidney disease, liver disease, or heart failure. Eating less sodium can help lower your blood pressure, reduce swelling, and protect your heart, liver, and kidneys. What are tips for following this plan? Reading food labels The Nutrition Facts label lists the amount of sodium in one serving of the food. If you eat more than one serving, you must multiply the listed amount of sodium by the number of servings. Choose foods with less than 140 mg of sodium per serving. Avoid foods with 300 mg of sodium or more per serving. Shopping  Look for lower-sodium products, often labeled as "low-sodium" or "no salt added." Always check the sodium content, even if foods are labeled as "unsalted" or "no salt added." Buy fresh foods. Avoid canned foods and pre-made or frozen meals. Avoid canned, cured, or processed meats. Buy breads that have less than 80 mg of sodium per slice. Cooking  Eat more home-cooked food and less restaurant, buffet, and fast food. Avoid adding salt when cooking. Use salt-free seasonings or herbs instead of table salt or sea salt. Check with your health care provider or pharmacist before using salt substitutes. Cook with plant-based oils, such as canola, sunflower, or olive oil. Meal  planning When eating at a restaurant, ask that your food be prepared with less salt or no salt, if possible. Avoid dishes labeled as brined, pickled, cured, smoked, or made with soy sauce, miso, or teriyaki sauce. Avoid foods that contain MSG (monosodium glutamate). MSG is sometimes added to [place] food, bouillon, and some canned foods. Make meals that can be grilled, baked, poached, roasted, or steamed. These are generally made with less sodium. General information Most people on this plan should limit their sodium intake to 1,500-2,000 mg (milligrams) of sodium each day. What foods should I eat? Fruits Fresh, frozen, or canned fruit. Fruit juice. Vegetables Fresh or frozen vegetables. "No salt added" canned vegetables. "No salt added" tomato sauce and paste. Low-sodium or reduced-sodium tomato and vegetable juice. Grains Low-sodium cereals, including oats, puffed wheat and rice, and shredded wheat. Low-sodium crackers. Unsalted rice. Unsalted pasta. Low-sodium bread. Whole-grain breads and whole-grain pasta. Meats and other proteins Fresh or frozen (no salt added) meat, poultry, seafood, and fish. Low-sodium canned tuna and salmon. Unsalted nuts. Dried peas, beans, and lentils without added salt. Unsalted canned  beans. Eggs. Unsalted nut butters. Dairy Milk. Soy milk. Cheese that is naturally low in sodium, such as ricotta cheese, fresh mozzarella, or Swiss cheese. Low-sodium or reduced-sodium cheese. Cream cheese. Yogurt. Seasonings and condiments Fresh and dried herbs and spices. Salt-free seasonings. Low-sodium mustard and ketchup. Sodium-free salad dressing. Sodium-free light mayonnaise. Fresh or refrigerated horseradish. Lemon juice. Vinegar. Other foods Homemade, reduced-sodium, or low-sodium soups. Unsalted popcorn and pretzels. Low-salt or salt-free chips. The items listed above may not be a complete list of foods and beverages you can eat. Contact a dietitian for more  information. What foods should I avoid? Vegetables Sauerkraut, pickled vegetables, and relishes. Olives. [place] fries. Onion rings. Regular canned vegetables (not low-sodium or reduced-sodium). Regular canned tomato sauce and paste (not low-sodium or reduced-sodium). Regular tomato and vegetable juice (not low-sodium or reduced-sodium). Frozen vegetables in sauces. Grains Instant hot cereals. Bread stuffing, pancake, and biscuit mixes. Croutons. Seasoned rice or pasta mixes. Noodle soup cups. Boxed or frozen macaroni and cheese. Regular salted crackers. Self-rising flour. Meats and other proteins Meat or fish that is salted, canned, smoked, spiced, or pickled. Precooked or cured meat, such as sausages or meat loaves. Tomasa BlaseBacon. Ham. Pepperoni. Hot dogs. Corned beef. Chipped beef. Salt pork. Jerky. Pickled herring. Anchovies and sardines. Regular canned tuna. Salted nuts. Dairy Processed cheese and cheese spreads. Hard cheeses. Cheese curds. Blue cheese. Feta cheese. String cheese. Regular cottage cheese. Buttermilk. Canned milk. Fats and oils Salted butter. Regular margarine. Ghee. Bacon fat. Seasonings and condiments Onion salt, garlic salt, seasoned salt, table salt, and sea salt. Canned and packaged gravies. Worcestershire sauce. Tartar sauce. Barbecue sauce. Teriyaki sauce. Soy sauce, including reduced-sodium. Steak sauce. Fish sauce. Oyster sauce. Cocktail sauce. Horseradish that you find on the shelf. Regular ketchup and mustard. Meat flavorings and tenderizers. Bouillon cubes. Hot sauce. Pre-made or packaged marinades. Pre-made or packaged taco seasonings. Relishes. Regular salad dressings. Salsa. Other foods Salted popcorn and pretzels. Corn chips and puffs. Potato and tortilla chips. Canned or dried soups. Pizza. Frozen entrees and pot pies. The items listed above may not be a complete list of foods and beverages you should avoid. Contact a dietitian for more information. Summary Eating less  sodium can help lower your blood pressure, reduce swelling, and protect your heart, liver, and kidneys. Most people on this plan should limit their sodium intake to 1,500-2,000 mg (milligrams) of sodium each day. Canned, boxed, and frozen foods are high in sodium. Restaurant foods, fast foods, and pizza are also very high in sodium. You also get sodium by adding salt to food. Try to cook at home, eat more fresh fruits and vegetables, and eat less fast food and canned, processed, or prepared foods. This information is not intended to replace advice given to you by your health care provider. Make sure you discuss any questions you have with your health care provider. Document Revised: 10/26/2019 Document Reviewed: 10/21/2019 Elsevier Patient Education  2023 [employer]

## 2023-03-11 DIAGNOSIS — E1140 Type 2 diabetes mellitus with diabetic neuropathy, unspecified: Secondary | ICD-10-CM | POA: Diagnosis not present

## 2023-03-11 DIAGNOSIS — E785 Hyperlipidemia, unspecified: Secondary | ICD-10-CM | POA: Diagnosis not present

## 2023-03-11 DIAGNOSIS — I10 Essential (primary) hypertension: Secondary | ICD-10-CM | POA: Diagnosis not present

## 2023-03-22 ENCOUNTER — Telehealth: Payer: Self-pay | Admitting: Cardiology

## 2023-03-22 NOTE — Telephone Encounter (Signed)
Patient called to report the following BP readings.  4/8 9:45 am BP 118/56  HR 56 4/9 7:35 am BP 124/64  HR 54 4/10 12:05 pm BP 129/64  HR 56 4/13  9:15 am BP 148/73  HR 57 4/13 4:10 pm BP 122/70  HR 62 4/15 11:45 am BP 135/72  HR 52 4/18 11:00 am BP 132/67  HR 56 4/19 2:30 pm BP 124/70  HR 58  Patient stated he got a new BP machine and wants a call back on when he can bring the machine in to the office to be checked.

## 2023-03-22 NOTE — Telephone Encounter (Signed)
No change in medication, continue to monitor  Sharlene Dory, PA-C    Noted

## 2023-03-22 NOTE — Telephone Encounter (Signed)
Spoke with pt who recently obtained a new BP monitor.  He reports his old one was inaccurate.  Advised these BP look good and to continue to use current monitor for his BP.  Pt is asking if he needs to change his medications due to these readings.  Advised no changes at this time.  Pt requested Jari Favre, PA be made aware of readings.  Will forward to her for his knowledge.

## 2023-03-25 ENCOUNTER — Telehealth: Payer: Self-pay | Admitting: Cardiology

## 2023-03-25 ENCOUNTER — Ambulatory Visit (HOSPITAL_BASED_OUTPATIENT_CLINIC_OR_DEPARTMENT_OTHER): Payer: Medicare Other | Admitting: Family

## 2023-03-25 NOTE — Telephone Encounter (Signed)
  Patient would like to switch from Dr Mayford Knife to Dr Antoine Poche. Please advise.

## 2023-04-03 NOTE — Telephone Encounter (Signed)
Patient is unable to travel [place]. Would to see if Dr Jens Som is willing to take him on as patient. Please advise

## 2023-04-17 ENCOUNTER — Ambulatory Visit (INDEPENDENT_AMBULATORY_CARE_PROVIDER_SITE_OTHER): Payer: Medicare Other | Admitting: Podiatry

## 2023-04-17 ENCOUNTER — Telehealth (HOSPITAL_COMMUNITY): Payer: Self-pay | Admitting: *Deleted

## 2023-04-17 ENCOUNTER — Encounter: Payer: Self-pay | Admitting: Podiatry

## 2023-04-17 DIAGNOSIS — M79676 Pain in unspecified toe(s): Secondary | ICD-10-CM

## 2023-04-17 DIAGNOSIS — B351 Tinea unguium: Secondary | ICD-10-CM

## 2023-04-17 DIAGNOSIS — E119 Type 2 diabetes mellitus without complications: Secondary | ICD-10-CM

## 2023-04-17 NOTE — Progress Notes (Signed)
This patient returns to my office for at risk foot care.  This patient requires this care by a professional since this patient will be at risk due to having  Diabetes.   This patient is unable to cut nails himself since the patient cannot reach his nails.These nails are painful walking and wearing shoes.  Patient is also concerned about swelling in his feet. He says there is no pain in his swollen feet.   This patient presents for at risk foot care today. ° °General Appearance  Alert, conversant and in no acute stress. ° °Vascular  Dorsalis pedis and posterior tibial  pulses are weakly  palpable  bilaterally.  Capillary return is within normal limits  bilaterally. Temperature is within normal limits  Bilaterally.  Venous stasis both legs.  Swelling feet. ° °Neurologic  Senn-Weinstein monofilament wire test within normal limits  bilaterally. Muscle power within normal limits bilaterally. ° °Nails Thick disfigured discolored nails with subungual debris  from hallux to fifth toes bilaterally. No evidence of bacterial infection or drainage bilaterally. ° °Orthopedic  No limitations of motion  feet .  No crepitus or effusions noted.  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  Prominent metatarsal heads  B/L. ° °Skin  normotropic skin with no porokeratosis noted bilaterally.  No signs of infections or ulcers noted.    ° °Onychomycosis  Pain in right toes  Pain in left toes ° °Consent was obtained for treatment procedures.   Mechanical debridement of nails 1-5  bilaterally performed with a nail nipper.  Filed with dremel without incident.   ° ° °Return office visit    10 weeks                  Told patient to return for periodic foot care and evaluation due to potential at risk complications. ° ° °Marvella Jenning DPM  °

## 2023-04-17 NOTE — Telephone Encounter (Signed)
Reaching out to patient to offer assistance regarding upcoming cardiac imaging study; pt verbalizes understanding of appt date/time, parking situation and where to check in, and verified current allergies; name and call back number provided for further questions should they arise ? ?Lara Palinkas RN Navigator Cardiac Imaging ?[facility] ?[phone] office ?[phone] cell ? ?Patient denies metal or claustrophobia. ?

## 2023-04-18 ENCOUNTER — Ambulatory Visit (HOSPITAL_COMMUNITY)
Admission: RE | Admit: 2023-04-18 | Discharge: 2023-04-18 | Disposition: A | Discharge status: Home / Self Care | Payer: Medicare Other | Source: Ambulatory Visit | Attending: Cardiology | Admitting: Cardiology

## 2023-04-18 ENCOUNTER — Other Ambulatory Visit: Payer: Self-pay | Admitting: Cardiology

## 2023-04-18 DIAGNOSIS — I10 Essential (primary) hypertension: Secondary | ICD-10-CM

## 2023-04-18 DIAGNOSIS — I5189 Other ill-defined heart diseases: Secondary | ICD-10-CM

## 2023-04-18 DIAGNOSIS — I4729 Other ventricular tachycardia: Secondary | ICD-10-CM | POA: Diagnosis not present

## 2023-04-18 DIAGNOSIS — I493 Ventricular premature depolarization: Secondary | ICD-10-CM | POA: Diagnosis not present

## 2023-04-18 MED ORDER — GADOBUTROL 1 MMOL/ML IV SOLN
10.0000 mL | Freq: Once | INTRAVENOUS | Status: AC | PRN
  Administered 2023-04-18: 10 mL via INTRAVENOUS

## 2023-04-22 ENCOUNTER — Telehealth: Payer: Self-pay | Admitting: Cardiology

## 2023-04-22 NOTE — Telephone Encounter (Signed)
Spoke to patient's caregiver and niece Raynelle Fanning.She was calling for cardiac mri results.Advised results not available.I will send message to PA that ordered test. 3 month follow up appointment scheduled with Dr.Crenshaw 7/19 at 9:30 am.

## 2023-04-22 NOTE — Telephone Encounter (Signed)
Patient's niece is requesting a call back to review MRI results.

## 2023-05-10 ENCOUNTER — Telehealth: Payer: Self-pay | Admitting: Cardiology

## 2023-05-10 NOTE — Telephone Encounter (Signed)
  Pt's niece calling, she is calling to get mri result. She said, pt can't remember the conversation with the nurse about the result. She gave her number [phone]

## 2023-05-10 NOTE — Telephone Encounter (Signed)
Called niece Ann Lions [facility]) to answer questions about recent cardiac MRI. 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 Jens Som at next visit in July.

## 2023-05-29 DIAGNOSIS — H16223 Keratoconjunctivitis sicca, not specified as Sjogren's, bilateral: Secondary | ICD-10-CM | POA: Diagnosis not present

## 2023-05-29 DIAGNOSIS — H10413 Chronic giant papillary conjunctivitis, bilateral: Secondary | ICD-10-CM | POA: Diagnosis not present

## 2023-05-29 DIAGNOSIS — H04213 Epiphora due to excess lacrimation, bilateral lacrimal glands: Secondary | ICD-10-CM | POA: Diagnosis not present

## 2023-06-05 DIAGNOSIS — D2272 Melanocytic nevi of left lower limb, including hip: Secondary | ICD-10-CM | POA: Diagnosis not present

## 2023-06-05 DIAGNOSIS — L578 Other skin changes due to chronic exposure to nonionizing radiation: Secondary | ICD-10-CM | POA: Diagnosis not present

## 2023-06-05 DIAGNOSIS — Z85820 Personal history of malignant melanoma of skin: Secondary | ICD-10-CM | POA: Diagnosis not present

## 2023-06-05 DIAGNOSIS — D485 Neoplasm of uncertain behavior of skin: Secondary | ICD-10-CM | POA: Diagnosis not present

## 2023-06-05 DIAGNOSIS — L570 Actinic keratosis: Secondary | ICD-10-CM | POA: Diagnosis not present

## 2023-06-05 DIAGNOSIS — L821 Other seborrheic keratosis: Secondary | ICD-10-CM | POA: Diagnosis not present

## 2023-06-05 DIAGNOSIS — D225 Melanocytic nevi of trunk: Secondary | ICD-10-CM | POA: Diagnosis not present

## 2023-06-10 DIAGNOSIS — E1140 Type 2 diabetes mellitus with diabetic neuropathy, unspecified: Secondary | ICD-10-CM | POA: Diagnosis not present

## 2023-06-10 DIAGNOSIS — E785 Hyperlipidemia, unspecified: Secondary | ICD-10-CM | POA: Diagnosis not present

## 2023-06-10 DIAGNOSIS — Z6831 Body mass index (BMI) 31.0-31.9, adult: Secondary | ICD-10-CM | POA: Diagnosis not present

## 2023-06-10 DIAGNOSIS — I10 Essential (primary) hypertension: Secondary | ICD-10-CM | POA: Diagnosis not present

## 2023-06-10 DIAGNOSIS — E6609 Other obesity due to excess calories: Secondary | ICD-10-CM | POA: Diagnosis not present
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
## 2023-06-12 DIAGNOSIS — H02834 Dermatochalasis of left upper eyelid: Secondary | ICD-10-CM | POA: Diagnosis not present

## 2023-06-12 DIAGNOSIS — H02831 Dermatochalasis of right upper eyelid: Secondary | ICD-10-CM | POA: Diagnosis not present

## 2023-06-12 DIAGNOSIS — H16223 Keratoconjunctivitis sicca, not specified as Sjogren's, bilateral: Secondary | ICD-10-CM | POA: Diagnosis not present

## 2023-06-17 ENCOUNTER — Ambulatory Visit: Payer: Medicare Other | Admitting: Physician Assistant

## 2023-06-17 ENCOUNTER — Telehealth: Payer: Self-pay | Admitting: Cardiology

## 2023-06-17 ENCOUNTER — Ambulatory Visit: Payer: Medicare Other | Admitting: Podiatry

## 2023-06-17 ENCOUNTER — Encounter: Payer: Self-pay | Admitting: Podiatry

## 2023-06-17 DIAGNOSIS — M79676 Pain in unspecified toe(s): Secondary | ICD-10-CM

## 2023-06-17 DIAGNOSIS — E119 Type 2 diabetes mellitus without complications: Secondary | ICD-10-CM

## 2023-06-17 DIAGNOSIS — B351 Tinea unguium: Secondary | ICD-10-CM

## 2023-06-17 NOTE — Telephone Encounter (Signed)
Spoke with pt's niece, Raynelle Fanning (ok per DPR). She lives in [place] and is unable to come to this cardiology appointment. She states that her uncle has some cognitive delays. She would like a call from the nurse after the visit to discuss how the appointment went and any orders or recommendations that Dr. Jens Som makes. She states that there is nothing specific that she is concerned about, she just wants to be made aware of how the visit goes. Will forward to provider's primary nurse.

## 2023-06-17 NOTE — Progress Notes (Signed)
This patient returns to my office for at risk foot care.  This patient requires this care by a professional since this patient will be at risk due to having  Diabetes.   This patient is unable to cut nails himself since the patient cannot reach his nails.These nails are painful walking and wearing shoes.  Patient is also concerned about swelling in his feet. He says there is no pain in his swollen feet.   This patient presents for at risk foot care today.  General Appearance  Alert, conversant and in no acute stress.  Vascular  Dorsalis pedis and posterior tibial  pulses are weakly  palpable  bilaterally.  Capillary return is within normal limits  bilaterally. Temperature is within normal limits  Bilaterally.  Venous stasis both legs.  Swelling feet.  Neurologic  Senn-Weinstein monofilament wire test within normal limits  bilaterally. Muscle power within normal limits bilaterally.  Nails Thick disfigured discolored nails with subungual debris  from hallux to fifth toes bilaterally. No evidence of bacterial infection or drainage bilaterally.  Orthopedic  No limitations of motion  feet .  No crepitus or effusions noted.  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  Prominent metatarsal heads  B/L.  Skin  normotropic skin with no porokeratosis noted bilaterally.  No signs of infections or ulcers noted.     Onychomycosis  Pain in right toes  Pain in left toes  Consent was obtained for treatment procedures.   Mechanical debridement of nails 1-5  bilaterally performed with a nail nipper.  Filed with dremel without incident.     Return office visit    9   weeks                  Told patient to return for periodic foot care and evaluation due to potential at risk complications.   Helane Gunther DPM

## 2023-06-17 NOTE — Telephone Encounter (Signed)
Pt's niece would like a callback from nurse regarding pt's upcoming appt. Please advise

## 2023-06-21 ENCOUNTER — Encounter: Payer: Self-pay | Admitting: Cardiology

## 2023-06-21 ENCOUNTER — Ambulatory Visit: Payer: Medicare Other | Attending: Cardiology | Admitting: Cardiology

## 2023-06-21 ENCOUNTER — Ambulatory Visit: Payer: Medicare Other | Admitting: Cardiology

## 2023-06-21 VITALS — BP 140/66 | HR 55 | Ht 67.0 in | Wt 196.0 lb

## 2023-06-21 DIAGNOSIS — R001 Bradycardia, unspecified: Secondary | ICD-10-CM | POA: Insufficient documentation

## 2023-06-21 DIAGNOSIS — E785 Hyperlipidemia, unspecified: Secondary | ICD-10-CM | POA: Insufficient documentation

## 2023-06-21 DIAGNOSIS — I493 Ventricular premature depolarization: Secondary | ICD-10-CM | POA: Diagnosis not present

## 2023-06-21 DIAGNOSIS — I10 Essential (primary) hypertension: Secondary | ICD-10-CM | POA: Diagnosis not present

## 2023-06-21 DIAGNOSIS — I4729 Other ventricular tachycardia: Secondary | ICD-10-CM | POA: Insufficient documentation

## 2023-06-21 DIAGNOSIS — I5189 Other ill-defined heart diseases: Secondary | ICD-10-CM | POA: Insufficient documentation

## 2023-06-21 MED ORDER — IRBESARTAN 300 MG PO TABS: 300.0000 mg | ORAL_TABLET | Freq: Every day | ORAL | 3 refills | Status: DC

## 2023-06-21 NOTE — Patient Instructions (Signed)
Medication Instructions:   INCREASE IRBESARTAN TO 300 MG ONCE DAILY= 2 OF THE 150 MG TABLETS ONCE DAILY  *If you need a refill on your cardiac medications before your next appointment, please call your pharmacy*   Lab Work:  Your physician recommends that you return for lab work in: [facility]  If you have labs (blood work) drawn today and your tests are completely normal, you will receive your results only by: MyChart Message (if you have MyChart) OR A paper copy in the mail If you have any lab test that is abnormal or we need to change your treatment, we will call you to review the results.   Follow-Up: At [facility], you and your health needs are our priority.  As part of our continuing mission to provide you with exceptional heart care, we have created designated Provider Care Teams.  These Care Teams include your primary Cardiologist (physician) and Advanced Practice Providers (APPs -  Physician Assistants and Nurse Practitioners) who all work together to provide you with the care you need, when you need it.  We recommend signing up for the patient portal called "MyChart".  Sign up information is provided on this After Visit Summary.  MyChart is used to connect with patients for Virtual Visits (Telemedicine).  Patients are able to view lab/test results, encounter notes, upcoming appointments, etc.  Non-urgent messages can be sent to your provider as well.   To learn more about what you can do with MyChart, go to [email or web address].    Your next appointment:   6 month(s)  Provider:   Olga Millers MD

## 2023-06-21 NOTE — Telephone Encounter (Signed)
Spoke with julie, the patient was given a copy of the note from his visit today. She has no other questions.

## 2023-06-28 DIAGNOSIS — I10 Essential (primary) hypertension: Secondary | ICD-10-CM | POA: Diagnosis not present

## 2023-06-28 DIAGNOSIS — E785 Hyperlipidemia, unspecified: Secondary | ICD-10-CM | POA: Diagnosis not present

## 2023-07-01 DIAGNOSIS — H838X3 Other specified diseases of inner ear, bilateral: Secondary | ICD-10-CM | POA: Diagnosis not present

## 2023-07-01 DIAGNOSIS — H6123 Impacted cerumen, bilateral: Secondary | ICD-10-CM | POA: Diagnosis not present

## 2023-07-01 DIAGNOSIS — H903 Sensorineural hearing loss, bilateral: Secondary | ICD-10-CM | POA: Diagnosis not present

## 2023-07-02 DIAGNOSIS — D2372 Other benign neoplasm of skin of left lower limb, including hip: Secondary | ICD-10-CM | POA: Diagnosis not present

## 2023-07-02 DIAGNOSIS — L905 Scar conditions and fibrosis of skin: Secondary | ICD-10-CM | POA: Diagnosis not present

## 2023-07-03 ENCOUNTER — Encounter: Payer: Self-pay | Admitting: *Deleted

## 2023-07-10 DIAGNOSIS — Z0000 Encounter for general adult medical examination without abnormal findings: Secondary | ICD-10-CM | POA: Diagnosis not present

## 2023-07-10 DIAGNOSIS — I10 Essential (primary) hypertension: Secondary | ICD-10-CM | POA: Diagnosis not present

## 2023-07-10 DIAGNOSIS — E785 Hyperlipidemia, unspecified: Secondary | ICD-10-CM | POA: Diagnosis not present

## 2023-07-10 DIAGNOSIS — I739 Peripheral vascular disease, unspecified: Secondary | ICD-10-CM | POA: Diagnosis not present

## 2023-07-10 DIAGNOSIS — Z8673 Personal history of transient ischemic attack (TIA), and cerebral infarction without residual deficits: Secondary | ICD-10-CM | POA: Diagnosis not present

## 2023-07-10 DIAGNOSIS — N3281 Overactive bladder: Secondary | ICD-10-CM | POA: Diagnosis not present

## 2023-07-10 DIAGNOSIS — C61 Malignant neoplasm of prostate: Secondary | ICD-10-CM | POA: Diagnosis not present

## 2023-07-10 DIAGNOSIS — F325 Major depressive disorder, single episode, in full remission: Secondary | ICD-10-CM | POA: Diagnosis not present

## 2023-07-10 DIAGNOSIS — E1140 Type 2 diabetes mellitus with diabetic neuropathy, unspecified: Secondary | ICD-10-CM | POA: Diagnosis not present

## 2023-07-10 DIAGNOSIS — I25118 Atherosclerotic heart disease of native coronary artery with other forms of angina pectoris: Secondary | ICD-10-CM | POA: Diagnosis not present

## 2023-08-07 DIAGNOSIS — Z23 Encounter for immunization: Secondary | ICD-10-CM | POA: Diagnosis not present

## 2023-08-08 ENCOUNTER — Emergency Department (HOSPITAL_COMMUNITY): Payer: Medicare Other

## 2023-08-08 ENCOUNTER — Other Ambulatory Visit: Payer: Self-pay

## 2023-08-08 ENCOUNTER — Emergency Department (HOSPITAL_COMMUNITY)
Admission: EM | Admit: 2023-08-08 | Discharge: 2023-08-08 | Disposition: A | Discharge status: Home / Self Care | Payer: Medicare Other | Attending: Student | Admitting: Student

## 2023-08-08 ENCOUNTER — Encounter (HOSPITAL_COMMUNITY): Payer: Self-pay

## 2023-08-08 DIAGNOSIS — M79632 Pain in left forearm: Secondary | ICD-10-CM | POA: Diagnosis not present

## 2023-08-08 DIAGNOSIS — E119 Type 2 diabetes mellitus without complications: Secondary | ICD-10-CM | POA: Diagnosis not present

## 2023-08-08 DIAGNOSIS — Z79899 Other long term (current) drug therapy: Secondary | ICD-10-CM | POA: Insufficient documentation

## 2023-08-08 DIAGNOSIS — S51812A Laceration without foreign body of left forearm, initial encounter: Secondary | ICD-10-CM | POA: Diagnosis present

## 2023-08-08 DIAGNOSIS — Z96643 Presence of artificial hip joint, bilateral: Secondary | ICD-10-CM | POA: Diagnosis not present

## 2023-08-08 DIAGNOSIS — Z7984 Long term (current) use of oral hypoglycemic drugs: Secondary | ICD-10-CM | POA: Insufficient documentation

## 2023-08-08 DIAGNOSIS — Z7982 Long term (current) use of aspirin: Secondary | ICD-10-CM | POA: Diagnosis not present

## 2023-08-08 DIAGNOSIS — S51012A Laceration without foreign body of left elbow, initial encounter: Secondary | ICD-10-CM | POA: Diagnosis not present

## 2023-08-08 DIAGNOSIS — I10 Essential (primary) hypertension: Secondary | ICD-10-CM | POA: Diagnosis not present

## 2023-08-08 DIAGNOSIS — Y92410 Unspecified street and highway as the place of occurrence of the external cause: Secondary | ICD-10-CM | POA: Insufficient documentation

## 2023-08-08 DIAGNOSIS — Z20822 Contact with and (suspected) exposure to covid-19: Secondary | ICD-10-CM | POA: Diagnosis not present

## 2023-08-08 DIAGNOSIS — Z8546 Personal history of malignant neoplasm of prostate: Secondary | ICD-10-CM | POA: Diagnosis not present

## 2023-08-08 DIAGNOSIS — R6883 Chills (without fever): Secondary | ICD-10-CM | POA: Diagnosis not present

## 2023-08-08 DIAGNOSIS — M19032 Primary osteoarthritis, left wrist: Secondary | ICD-10-CM | POA: Diagnosis not present
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
## 2023-08-09 ENCOUNTER — Telehealth: Payer: Self-pay
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
## 2023-08-12 DIAGNOSIS — S51812D Laceration without foreign body of left forearm, subsequent encounter: Secondary | ICD-10-CM | POA: Diagnosis not present

## 2023-08-12 DIAGNOSIS — M545 Low back pain, unspecified: Secondary | ICD-10-CM | POA: Diagnosis not present

## 2023-08-12 DIAGNOSIS — Z041 Encounter for examination and observation following transport accident: Secondary | ICD-10-CM | POA: Diagnosis not present

## 2023-08-13 ENCOUNTER — Ambulatory Visit: Payer: Medicare Other | Admitting: Podiatry

## 2023-08-22 ENCOUNTER — Other Ambulatory Visit: Payer: Self-pay | Admitting: Cardiology

## 2023-08-22 ENCOUNTER — Ambulatory Visit
Admission: RE | Admit: 2023-08-22 | Discharge: 2023-08-22 | Disposition: A | Discharge status: Home / Self Care | Payer: Medicare Other | Source: Ambulatory Visit | Attending: Physician Assistant | Admitting: Physician Assistant

## 2023-08-22 ENCOUNTER — Other Ambulatory Visit: Payer: Self-pay | Admitting: Physician Assistant

## 2023-08-22 DIAGNOSIS — C61 Malignant neoplasm of prostate: Secondary | ICD-10-CM | POA: Diagnosis not present

## 2023-08-22 DIAGNOSIS — R0789 Other chest pain: Secondary | ICD-10-CM

## 2023-08-22 DIAGNOSIS — R079 Chest pain, unspecified: Secondary | ICD-10-CM | POA: Diagnosis not present

## 2023-08-22 DIAGNOSIS — I10 Essential (primary) hypertension: Secondary | ICD-10-CM

## 2023-08-26 ENCOUNTER — Ambulatory Visit: Payer: Medicare Other | Admitting: Podiatry

## 2023-08-27 ENCOUNTER — Encounter: Payer: Self-pay | Admitting: Podiatry

## 2023-08-27 ENCOUNTER — Ambulatory Visit (INDEPENDENT_AMBULATORY_CARE_PROVIDER_SITE_OTHER): Payer: Medicare Other | Admitting: Podiatry

## 2023-08-27 DIAGNOSIS — M79676 Pain in unspecified toe(s): Secondary | ICD-10-CM | POA: Diagnosis not present

## 2023-08-27 DIAGNOSIS — E119 Type 2 diabetes mellitus without complications: Secondary | ICD-10-CM

## 2023-08-27 DIAGNOSIS — B351 Tinea unguium: Secondary | ICD-10-CM | POA: Diagnosis not present

## 2023-08-27 NOTE — Progress Notes (Signed)
This patient returns to my office for at risk foot care.  This patient requires this care by a professional since this patient will be at risk due to having  Diabetes.   This patient is unable to cut nails himself since the patient cannot reach his nails.These nails are painful walking and wearing shoes.  He says there is no pain in his swollen feet.   This patient presents for at risk foot care today.  General Appearance  Alert, conversant and in no acute stress.  Vascular  Dorsalis pedis and posterior tibial  pulses are weakly  palpable  bilaterally.  Capillary return is within normal limits  bilaterally. Temperature is within normal limits  Bilaterally.  Venous stasis both legs.  Swelling feet.  Neurologic  Senn-Weinstein monofilament wire test within normal limits  bilaterally. Muscle power within normal limits bilaterally.  Nails Thick disfigured discolored nails with subungual debris  from hallux to fifth toes bilaterally. No evidence of bacterial infection or drainage bilaterally.  Orthopedic  No limitations of motion  feet .  No crepitus or effusions noted.  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  Prominent metatarsal heads  B/L.  Skin  normotropic skin with no porokeratosis noted bilaterally.  No signs of infections or ulcers noted.     Onychomycosis  Pain in right toes  Pain in left toes  Consent was obtained for treatment procedures.   Mechanical debridement of nails 1-5  bilaterally performed with a nail nipper.  Filed with dremel without incident.     Return office visit    9   weeks                  Told patient to return for periodic foot care and evaluation due to potential at risk complications.   Helane Gunther DPM

## 2023-08-28 DIAGNOSIS — Z23 Encounter for immunization: Secondary | ICD-10-CM | POA: Diagnosis not present

## 2023-08-29 DIAGNOSIS — R2689 Other abnormalities of gait and mobility: Secondary | ICD-10-CM | POA: Diagnosis not present

## 2023-09-03 DIAGNOSIS — R2689 Other abnormalities of gait and mobility: Secondary | ICD-10-CM | POA: Diagnosis not present

## 2023-09-05 NOTE — Progress Notes (Signed)
HPI: Follow-up abnormal PET scan, bradycardia, prior TIA and hypertension. Echocardiogram October 2023 showed ejection fraction 50%, moderate left ventricular hypertrophy, grade 1 diastolic dysfunction, trace aortic insufficiency.  Monitor February 2024 showed sinus rhythm with heart rate between 42 and 103, frequent PVCs, couplets, triplets and 4 episodes of nonsustained ventricular tachycardia lasting 5 beats.  Follow-up PET stress showed small territory of mid left circumflex ischemia and mild TID possibly secondary to ectopy.  Coronary calcifications noted to be present as well and ejection fraction 49%. Patient seen in follow-up April 2024 concerning his abnormal stress test and given that he was not having symptoms medical therapy was recommended. Cardiac MRI 5/24 showed ejection fraction 49% with diffuse hypokinesis, normal RV function and findings suggestive of prior circumflex infarction.  Since last seen, he denies dyspnea, chest pain, palpitations or syncope.  Current Outpatient Medications  Medication Sig Dispense Refill   ACCU-CHEK GUIDE test strip      amLODipine (NORVASC) 5 MG tablet Take 1 tablet (5 mg total) by mouth daily. 60 tablet 2   ascorbic acid (VITAMIN C) 1000 MG tablet Take 1,000 mg by mouth daily.     aspirin EC 81 MG tablet Take 81 mg by mouth. Per patient taking 2 tablets daily     atorvastatin (LIPITOR) 80 MG tablet Take 80 mg by mouth daily.     buPROPion (WELLBUTRIN XL) 150 MG 24 hr tablet Take 150 mg by mouth daily.     folic acid (FOLVITE) 400 MCG tablet Take 400 mcg by mouth daily.      irbesartan (AVAPRO) 300 MG tablet Take 1 tablet (300 mg total) by mouth daily. 90 tablet 3   isosorbide mononitrate (IMDUR) 30 MG 24 hr tablet Take 1 tablet (30 mg total) by mouth daily. 90 tablet 3   Multiple Vitamin (MULTIVITAMIN) tablet Take 1 tablet by mouth daily.     oxybutynin (DITROPAN-XL) 5 MG 24 hr tablet Take 5 mg by mouth daily.     vitamin E 400 UNIT capsule Take  400 Units by mouth daily.     metFORMIN (GLUCOPHAGE) 1000 MG tablet Take 1 tablet (1,000 mg total) by mouth 2 (two) times daily with a meal. 60 tablet 1   No current facility-administered medications for this visit.     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   Cancer (HCC)    Congenital deformity of ankle joint    Depression    a long time ago    Diabetes mellitus without complication (HCC)    type 2   Hyperlipidemia    Hypertension    Inguinal hernia    Prostate cancer (HCC)    PVC's (premature ventricular contractions)    PVC load 10.6% with NSVT up to 5 beats on heart monitor 01/2023   Skin cancer (melanoma) (HCC)    historical    Wears hearing aid    bialteral     Past Surgical History:  Procedure Laterality Date   INGUINAL HERNIA REPAIR     right and left hernias  miltiple times with mesh placement    INGUINAL HERNIA REPAIR Left 12/26/2018   Procedure: OPEN LEFT INGUINAL HERNIA WITH MESH;  Surgeon: Luretha Murphy, MD;  Location: [facility];  Service: General;  Laterality: Left;   nose myeloma     PROSTATECTOMY  2005   TOTAL HIP ARTHROPLASTY  "long time ago "   bilateral ; [place]     Social History   Socioeconomic History   Marital status: Single    Spouse name: Not on file   Number of children: Not on file   Years of education: Not on file   Highest education level: Not on file  Occupational History   Not on file  Tobacco Use   Smoking status: Never   Smokeless tobacco: Never  Vaping Use   Vaping status: Never Used  Substance and Sexual Activity   Alcohol use: No   Drug use: No   Sexual activity: Not Currently  Other Topics Concern   Not on file  Social History Narrative   Not on file   Social Determinants of Health   Financial Resource Strain: Not on file  Food Insecurity: No Food Insecurity (09/15/2022)   Hunger Vital Sign     Worried About Running Out of Food in the Last Year: Never true    Ran Out of Food in the Last Year: Never true  Transportation Needs: No Transportation Needs (09/15/2022)   PRAPARE - Administrator, Civil Service (Medical): No    Lack of Transportation (Non-Medical): No  Physical Activity: Not on file  Stress: Not on file  Social Connections: Not on file  Intimate Partner Violence: Not At Risk (09/15/2022)   Humiliation, Afraid, Rape, and Kick questionnaire    Fear of Current or Ex-Partner: No    Emotionally Abused: No    Physically Abused: No    Sexually Abused: No    Family History  Problem Relation Age of Onset   Colon cancer Mother    Kidney cancer Mother    Heart disease Father    Lung cancer Brother    Pancreatic cancer Neg Hx    Prostate cancer Neg Hx    Breast cancer Neg Hx     ROS: no fevers or chills, productive cough, hemoptysis, dysphasia, odynophagia, melena, hematochezia, dysuria, hematuria, rash, seizure activity, orthopnea, PND, pedal edema, claudication. Remaining systems are negative.  Physical Exam: Well-developed well-nourished in no acute distress.  Skin is warm and dry.  HEENT is normal.  Neck is supple.  Chest is clear to auscultation with normal expansion.  Cardiovascular exam is regular rate and rhythm.  Abdominal exam nontender or distended. No masses palpated. Extremities show no edema. neuro grossly intact  A/P  1 history of abnormal stress PET-previously noted to have small area of left circumflex ischemia.  Patient continues to do well from a symptomatic standpoint with no chest pain or dyspnea.  Will continue medical therapy at this point including aspirin and statin.  Given history of mildly reduced LV function and PVCs I will add Toprol 25 mg nightly.  Discontinue isosorbide.  2 coronary calcification-plan as outlined under #1.  3 mild LV dysfunction-continue ARB.  He is previously not on a beta-blocker as he had mild  bradycardia at baseline.  I will try low-dose Toprol at 25 mg nightly to see if he tolerates particularly in light of history of PVCs/nonsustained ventricular tachycardia.  4 nonsustained ventricular tachycardia-plan as outlined under #3.  5 hyperlipidemia-continue statin.  6 hypertension-continue Avapro and add low-dose Toprol as outlined.  Follow blood pressure and adjust as needed.  7 prior TIA-continue aspirin.  Olga Millers, MD

## 2023-09-06 DIAGNOSIS — R2689 Other abnormalities of gait and mobility: Secondary | ICD-10-CM | POA: Diagnosis not present

## 2023-09-09 DIAGNOSIS — R2689 Other abnormalities of gait and mobility: Secondary | ICD-10-CM | POA: Diagnosis not present

## 2023-09-12 DIAGNOSIS — R2689 Other abnormalities of gait and mobility: Secondary | ICD-10-CM | POA: Diagnosis not present

## 2023-09-17 ENCOUNTER — Ambulatory Visit: Payer: Medicare Other | Admitting: Podiatry

## 2023-09-19 ENCOUNTER — Telehealth: Payer: Self-pay | Admitting: Cardiology

## 2023-09-19 ENCOUNTER — Ambulatory Visit: Payer: Medicare Other | Attending: Physician Assistant | Admitting: Cardiology

## 2023-09-19 ENCOUNTER — Encounter: Payer: Self-pay | Admitting: Cardiology

## 2023-09-19 VITALS — BP 120/58 | HR 60 | Ht 67.0 in | Wt 203.0 lb

## 2023-09-19 DIAGNOSIS — I4729 Other ventricular tachycardia: Secondary | ICD-10-CM | POA: Diagnosis not present

## 2023-09-19 DIAGNOSIS — I493 Ventricular premature depolarization: Secondary | ICD-10-CM | POA: Insufficient documentation

## 2023-09-19 DIAGNOSIS — I10 Essential (primary) hypertension: Secondary | ICD-10-CM | POA: Insufficient documentation

## 2023-09-19 DIAGNOSIS — G459 Transient cerebral ischemic attack, unspecified: Secondary | ICD-10-CM | POA: Diagnosis not present

## 2023-09-19 DIAGNOSIS — E785 Hyperlipidemia, unspecified: Secondary | ICD-10-CM | POA: Diagnosis not present

## 2023-09-19 DIAGNOSIS — I2510 Atherosclerotic heart disease of native coronary artery without angina pectoris: Secondary | ICD-10-CM | POA: Insufficient documentation

## 2023-09-19 MED ORDER — METOPROLOL SUCCINATE ER 25 MG PO TB24: 25.0000 mg | ORAL_TABLET | Freq: Every evening | ORAL | 3 refills | Status: AC

## 2023-09-19 MED ORDER — METOPROLOL TARTRATE 25 MG PO TABS
25.0000 mg | ORAL_TABLET | Freq: Every day | ORAL | 3 refills | Status: DC
Start: 2023-09-19 — End: 2023-09-19

## 2023-09-19 NOTE — Telephone Encounter (Signed)
Office Visit notes from Today 09/19/23  1 history of abnormal stress PET-previously noted to have small area of left circumflex ischemia.  Patient continues to do well from a symptomatic standpoint with no chest pain or dyspnea.  Will continue medical therapy at this point including aspirin and statin.  Given history of mildly reduced LV function and PVCs I will add Toprol 25 mg nightly.  Discontinue isosorbide.   2 coronary calcification-plan as outlined under #1.   3 mild LV dysfunction-continue ARB.  He is previously not on a beta-blocker as he had mild bradycardia at baseline.  I will try low-dose Toprol at 25 mg nightly to see if he tolerates particularly in light of history of PVCs/nonsustained ventricular tachycardia.   4 nonsustained ventricular tachycardia-plan as outlined under #3.   5 hyperlipidemia-continue statin.   6 hypertension-continue Avapro and add low-dose Toprol as outlined.  Follow blood pressure and adjust as needed.   7 prior TIA-continue aspirin.   Olga Millers, MD  Spoke with pharmacy staff and instructed to not fill the Metoprolol Tartrate. Sending prescription for Toprol XL 25 mg every night. I also informed pharmacy that the Isosorbide Mononitrate has been d/c'd. Verbalized understanding.  Toprol XL 25 mg Prescription sent to pharmacy.

## 2023-09-19 NOTE — Telephone Encounter (Signed)
Pt c/o medication issue:  1. Name of Medication: metoprolol tartrate (LOPRESSOR) 25 MG tablet   2. How are you currently taking this medication (dosage and times per day)?   3. Are you having a reaction (difficulty breathing--STAT)?   4. What is your medication issue? Pharmacy called to get clarification on medication. States that the tartrate is typically given twice daily, but this is only for once daily at bedtime. Please advise.

## 2023-09-19 NOTE — Patient Instructions (Signed)
Medication Instructions:  Stop the isosorbide  Start metoprolol 25 mg at night  *If you need a refill on your cardiac medications before your next appointment, please call your pharmacy*   Lab Work: None  If you have labs (blood work) drawn today and your tests are completely normal, you will receive your results only by: MyChart Message (if you have MyChart) OR A paper copy in the mail If you have any lab test that is abnormal or we need to change your treatment, we will call you to review the results.   Testing/Procedures: None   Follow-Up: At [facility], you and your health needs are our priority.  As part of our continuing mission to provide you with exceptional heart care, we have created designated Provider Care Teams.  These Care Teams include your primary Cardiologist (physician) and Advanced Practice Providers (APPs -  Physician Assistants and Nurse Practitioners) who all work together to provide you with the care you need, when you need it.   Your next appointment:   6 month(s)  Provider:   Olga Millers, MD

## 2023-09-23 DIAGNOSIS — R2689 Other abnormalities of gait and mobility: Secondary | ICD-10-CM | POA: Diagnosis not present

## 2023-09-26 DIAGNOSIS — E6689 Other obesity not elsewhere classified: Secondary | ICD-10-CM | POA: Diagnosis not present

## 2023-09-26 DIAGNOSIS — R2689 Other abnormalities of gait and mobility: Secondary | ICD-10-CM | POA: Diagnosis not present

## 2023-09-26 DIAGNOSIS — I10 Essential (primary) hypertension: Secondary | ICD-10-CM | POA: Diagnosis not present

## 2023-09-26 DIAGNOSIS — E1140 Type 2 diabetes mellitus with diabetic neuropathy, unspecified: Secondary | ICD-10-CM | POA: Diagnosis not present

## 2023-09-26 DIAGNOSIS — Z6832 Body mass index (BMI) 32.0-32.9, adult: Secondary | ICD-10-CM | POA: Diagnosis not present

## 2023-09-26 DIAGNOSIS — E785 Hyperlipidemia, unspecified: Secondary | ICD-10-CM | POA: Diagnosis not present

## 2023-09-30 DIAGNOSIS — R2689 Other abnormalities of gait and mobility: Secondary | ICD-10-CM | POA: Diagnosis not present

## 2023-10-03 DIAGNOSIS — R2689 Other abnormalities of gait and mobility: Secondary | ICD-10-CM | POA: Diagnosis not present

## 2023-11-05 ENCOUNTER — Ambulatory Visit: Payer: Medicare Other | Admitting: Podiatry

## 2023-11-19 ENCOUNTER — Ambulatory Visit: Payer: Medicare Other | Admitting: Podiatry

## 2023-11-20 ENCOUNTER — Ambulatory Visit (INDEPENDENT_AMBULATORY_CARE_PROVIDER_SITE_OTHER): Payer: Medicare Other | Admitting: Podiatry

## 2023-11-20 ENCOUNTER — Encounter: Payer: Self-pay | Admitting: Podiatry

## 2023-11-20 DIAGNOSIS — E119 Type 2 diabetes mellitus without complications: Secondary | ICD-10-CM | POA: Diagnosis not present

## 2023-11-20 DIAGNOSIS — M79676 Pain in unspecified toe(s): Secondary | ICD-10-CM | POA: Diagnosis not present

## 2023-11-20 DIAGNOSIS — B351 Tinea unguium: Secondary | ICD-10-CM

## 2023-11-20 NOTE — Progress Notes (Signed)
This patient returns to my office for at risk foot care.  This patient requires this care by a professional since this patient will be at risk due to having  Diabetes.   This patient is unable to cut nails himself since the patient cannot reach his nails.These nails are painful walking and wearing shoes.  He says there is no pain in his swollen feet.   This patient presents for at risk foot care today.  General Appearance  Alert, conversant and in no acute stress.  Vascular  Dorsalis pedis and posterior tibial  pulses are weakly  palpable  bilaterally.  Capillary return is within normal limits  bilaterally. Temperature is within normal limits  Bilaterally.  Venous stasis both legs.  Swelling feet.  Neurologic  Senn-Weinstein monofilament wire test within normal limits  bilaterally. Muscle power within normal limits bilaterally.  Nails Thick disfigured discolored nails with subungual debris  from hallux to fifth toes bilaterally. No evidence of bacterial infection or drainage bilaterally.  Orthopedic  No limitations of motion  feet .  No crepitus or effusions noted.  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  Prominent metatarsal heads  B/L.  Skin  normotropic skin with no porokeratosis noted bilaterally.  No signs of infections or ulcers noted.     Onychomycosis  Pain in right toes  Pain in left toes  Consent was obtained for treatment procedures.   Mechanical debridement of nails 1-5  bilaterally performed with a nail nipper.  Filed with dremel without incident.     Return office visit    9   weeks                  Told patient to return for periodic foot care and evaluation due to potential at risk complications.   Helane Gunther DPM

## 2023-12-17 ENCOUNTER — Ambulatory Visit: Payer: Medicare Other | Admitting: Podiatry

## 2023-12-19 DIAGNOSIS — E6689 Other obesity not elsewhere classified: Secondary | ICD-10-CM | POA: Diagnosis not present

## 2023-12-19 DIAGNOSIS — Z6831 Body mass index (BMI) 31.0-31.9, adult: Secondary | ICD-10-CM | POA: Diagnosis not present

## 2023-12-19 DIAGNOSIS — E1140 Type 2 diabetes mellitus with diabetic neuropathy, unspecified: Secondary | ICD-10-CM | POA: Diagnosis not present

## 2023-12-31 ENCOUNTER — Telehealth (INDEPENDENT_AMBULATORY_CARE_PROVIDER_SITE_OTHER): Payer: Self-pay | Admitting: Otolaryngology

## 2023-12-31 NOTE — Telephone Encounter (Signed)
Patient is aware of address for appt on 01/01/2024.

## 2024-01-01 ENCOUNTER — Encounter (INDEPENDENT_AMBULATORY_CARE_PROVIDER_SITE_OTHER): Payer: Self-pay

## 2024-01-01 ENCOUNTER — Ambulatory Visit (INDEPENDENT_AMBULATORY_CARE_PROVIDER_SITE_OTHER): Payer: Medicare Other | Admitting: Otolaryngology

## 2024-01-01 VITALS — BP 130/68 | HR 60 | Resp 19 | Wt 203.0 lb

## 2024-01-01 DIAGNOSIS — H6123 Impacted cerumen, bilateral: Secondary | ICD-10-CM | POA: Insufficient documentation

## 2024-01-01 NOTE — Progress Notes (Signed)
Patient ID: Jay Bailey, male   DOB: 04-13-1941, 83 y.o.   MRN: [id number]  Procedure: Bilateral cerumen disimpaction.   Indication: Cerumen impaction, resulting in ear discomfort and conductive hearing loss.   Description: The patient is placed supine on the operating table. Under the operating microscope, the right ear canal is examined and is noted to be impacted with cerumen. The cerumen is carefully removed with a combination of suction catheters, cerumen curette, and alligator forceps. After the cerumen removal, the ear canal and tympanic membrane are noted to be normal. No middle ear effusion is noted. The same procedure is then repeated on the left side without exception. The patient tolerated the procedure well.  Follow-up care:  The patient is instructed not to use Q-tips to clean the ear canals. The patient will follow up in 6 months.

## 2024-01-09 DIAGNOSIS — R609 Edema, unspecified: Secondary | ICD-10-CM | POA: Diagnosis not present

## 2024-01-09 DIAGNOSIS — I25118 Atherosclerotic heart disease of native coronary artery with other forms of angina pectoris: Secondary | ICD-10-CM | POA: Diagnosis not present

## 2024-01-09 DIAGNOSIS — Z23 Encounter for immunization: Secondary | ICD-10-CM | POA: Diagnosis not present

## 2024-01-09 DIAGNOSIS — I10 Essential (primary) hypertension: Secondary | ICD-10-CM | POA: Diagnosis not present

## 2024-01-09 DIAGNOSIS — F325 Major depressive disorder, single episode, in full remission: Secondary | ICD-10-CM | POA: Diagnosis not present

## 2024-01-09 DIAGNOSIS — E785 Hyperlipidemia, unspecified: Secondary | ICD-10-CM | POA: Diagnosis not present

## 2024-01-09 DIAGNOSIS — I739 Peripheral vascular disease, unspecified: Secondary | ICD-10-CM | POA: Diagnosis not present

## 2024-01-09 DIAGNOSIS — E1140 Type 2 diabetes mellitus with diabetic neuropathy, unspecified: Secondary | ICD-10-CM | POA: Diagnosis not present

## 2024-01-20 ENCOUNTER — Ambulatory Visit (INDEPENDENT_AMBULATORY_CARE_PROVIDER_SITE_OTHER): Payer: Medicare Other | Admitting: Podiatry

## 2024-01-20 ENCOUNTER — Encounter: Payer: Self-pay | Admitting: Podiatry

## 2024-01-20 DIAGNOSIS — E119 Type 2 diabetes mellitus without complications: Secondary | ICD-10-CM

## 2024-01-20 DIAGNOSIS — M79676 Pain in unspecified toe(s): Secondary | ICD-10-CM

## 2024-01-20 DIAGNOSIS — B351 Tinea unguium: Secondary | ICD-10-CM

## 2024-01-20 NOTE — Progress Notes (Signed)
 This patient returns to my office for at risk foot care.  This patient requires this care by a professional since this patient will be at risk due to having  Diabetes.   This patient is unable to cut nails himself since the patient cannot reach his nails.These nails are painful walking and wearing shoes.  He says there is no pain in his swollen feet.   This patient presents for at risk foot care today.  General Appearance  Alert, conversant and in no acute stress.  Vascular  Dorsalis pedis and posterior tibial  pulses are weakly  palpable  bilaterally.  Capillary return is within normal limits  bilaterally. Temperature is within normal limits  Bilaterally.  Venous stasis both legs.  Swelling feet.  Neurologic  Senn-Weinstein monofilament wire test within normal limits  bilaterally. Muscle power within normal limits bilaterally.  Nails Thick disfigured discolored nails with subungual debris  from hallux to fifth toes bilaterally. No evidence of bacterial infection or drainage bilaterally.  Orthopedic  No limitations of motion  feet .  No crepitus or effusions noted.  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  Prominent metatarsal heads  B/L.  Skin  normotropic skin with no porokeratosis noted bilaterally.  No signs of infections or ulcers noted.     Onychomycosis  Pain in right toes  Pain in left toes  Consent was obtained for treatment procedures.   Mechanical debridement of nails 1-5  bilaterally performed with a nail nipper.  Filed with dremel without incident.     Return office visit    10   weeks                  Told patient to return for periodic foot care and evaluation due to potential at risk complications.   Helane Gunther DPM

## 2024-01-21 DIAGNOSIS — H02403 Unspecified ptosis of bilateral eyelids: Secondary | ICD-10-CM | POA: Diagnosis not present

## 2024-01-24 DIAGNOSIS — L570 Actinic keratosis: Secondary | ICD-10-CM | POA: Diagnosis not present

## 2024-01-24 DIAGNOSIS — L578 Other skin changes due to chronic exposure to nonionizing radiation: Secondary | ICD-10-CM | POA: Diagnosis not present

## 2024-01-24 DIAGNOSIS — D225 Melanocytic nevi of trunk: Secondary | ICD-10-CM | POA: Diagnosis not present

## 2024-01-24 DIAGNOSIS — Z85820 Personal history of malignant melanoma of skin: Secondary | ICD-10-CM | POA: Diagnosis not present

## 2024-01-24 DIAGNOSIS — H02419 Mechanical ptosis of unspecified eyelid: Secondary | ICD-10-CM | POA: Diagnosis not present

## 2024-01-24 DIAGNOSIS — H4900 Third [oculomotor] nerve palsy, unspecified eye: Secondary | ICD-10-CM | POA: Diagnosis not present

## 2024-01-24 DIAGNOSIS — D2272 Melanocytic nevi of left lower limb, including hip: Secondary | ICD-10-CM | POA: Diagnosis not present

## 2024-01-24 DIAGNOSIS — L821 Other seborrheic keratosis: Secondary | ICD-10-CM | POA: Diagnosis not present

## 2024-01-30 DIAGNOSIS — H02403 Unspecified ptosis of bilateral eyelids: Secondary | ICD-10-CM | POA: Diagnosis not present

## 2024-02-13 DIAGNOSIS — I10 Essential (primary) hypertension: Secondary | ICD-10-CM | POA: Diagnosis not present

## 2024-02-13 DIAGNOSIS — E1140 Type 2 diabetes mellitus with diabetic neuropathy, unspecified: Secondary | ICD-10-CM | POA: Diagnosis not present

## 2024-02-13 DIAGNOSIS — I739 Peripheral vascular disease, unspecified: Secondary | ICD-10-CM | POA: Diagnosis not present

## 2024-02-13 DIAGNOSIS — I25118 Atherosclerotic heart disease of native coronary artery with other forms of angina pectoris: Secondary | ICD-10-CM | POA: Diagnosis not present

## 2024-02-17 ENCOUNTER — Other Ambulatory Visit: Payer: Self-pay | Admitting: Cardiology

## 2024-02-17 DIAGNOSIS — E1140 Type 2 diabetes mellitus with diabetic neuropathy, unspecified: Secondary | ICD-10-CM | POA: Diagnosis not present

## 2024-02-17 DIAGNOSIS — I25118 Atherosclerotic heart disease of native coronary artery with other forms of angina pectoris: Secondary | ICD-10-CM | POA: Diagnosis not present

## 2024-02-17 DIAGNOSIS — E785 Hyperlipidemia, unspecified: Secondary | ICD-10-CM | POA: Diagnosis not present

## 2024-02-17 DIAGNOSIS — I10 Essential (primary) hypertension: Secondary | ICD-10-CM | POA: Diagnosis not present

## 2024-02-19 NOTE — Telephone Encounter (Signed)
*  STAT* If patient is at the pharmacy, call can be transferred to refill team.   1. Which medications need to be refilled? (please list name of each medication and dose if known)  Amlodipine   2. Would you like to learn more about the convenience, safety, & potential cost savings by using the [facility]?     3. Are you open to using the [facility] (Type [facility]..   4. Which pharmacy/location (including street and city if local pharmacy) is medication to be sent to? [facility], [place]     5. Do they need a 30 day or 90 day supply? 90 days and refills

## 2024-03-02 DIAGNOSIS — E1140 Type 2 diabetes mellitus with diabetic neuropathy, unspecified: Secondary | ICD-10-CM | POA: Diagnosis not present

## 2024-03-02 DIAGNOSIS — I739 Peripheral vascular disease, unspecified: Secondary | ICD-10-CM | POA: Diagnosis not present

## 2024-03-02 DIAGNOSIS — I10 Essential (primary) hypertension: Secondary | ICD-10-CM | POA: Diagnosis not present

## 2024-03-02 DIAGNOSIS — I25118 Atherosclerotic heart disease of native coronary artery with other forms of angina pectoris: Secondary | ICD-10-CM | POA: Diagnosis not present

## 2024-03-02 DIAGNOSIS — E785 Hyperlipidemia, unspecified: Secondary | ICD-10-CM | POA: Diagnosis not present

## 2024-03-18 DIAGNOSIS — I25118 Atherosclerotic heart disease of native coronary artery with other forms of angina pectoris: Secondary | ICD-10-CM | POA: Diagnosis not present

## 2024-03-18 DIAGNOSIS — I10 Essential (primary) hypertension: Secondary | ICD-10-CM | POA: Diagnosis not present

## 2024-03-18 DIAGNOSIS — E1140 Type 2 diabetes mellitus with diabetic neuropathy, unspecified: Secondary | ICD-10-CM | POA: Diagnosis not present

## 2024-03-18 DIAGNOSIS — I739 Peripheral vascular disease, unspecified: Secondary | ICD-10-CM | POA: Diagnosis not present

## 2024-03-26 DIAGNOSIS — N200 Calculus of kidney: Secondary | ICD-10-CM | POA: Diagnosis not present

## 2024-03-26 DIAGNOSIS — C61 Malignant neoplasm of prostate: Secondary | ICD-10-CM | POA: Diagnosis not present

## 2024-03-31 ENCOUNTER — Encounter: Payer: Self-pay | Admitting: Podiatry

## 2024-03-31 ENCOUNTER — Ambulatory Visit (INDEPENDENT_AMBULATORY_CARE_PROVIDER_SITE_OTHER): Payer: Medicare Other | Admitting: Podiatry

## 2024-03-31 DIAGNOSIS — E119 Type 2 diabetes mellitus without complications: Secondary | ICD-10-CM

## 2024-03-31 DIAGNOSIS — B351 Tinea unguium: Secondary | ICD-10-CM

## 2024-03-31 DIAGNOSIS — M79676 Pain in unspecified toe(s): Secondary | ICD-10-CM | POA: Diagnosis not present

## 2024-03-31 NOTE — Progress Notes (Signed)
 This patient returns to my office for at risk foot care.  This patient requires this care by a professional since this patient will be at risk due to having  Diabetes.   This patient is unable to cut nails himself since the patient cannot reach his nails.These nails are painful walking and wearing shoes.  He says there is no pain in his swollen feet.   This patient presents for at risk foot care today.  General Appearance  Alert, conversant and in no acute stress.  Vascular  Dorsalis pedis and posterior tibial  pulses are weakly  palpable  bilaterally.  Capillary return is within normal limits  bilaterally. Temperature is within normal limits  Bilaterally.  Venous stasis both legs.  Swelling feet.  Neurologic  Senn-Weinstein monofilament wire test within normal limits  bilaterally. Muscle power within normal limits bilaterally.  Nails Thick disfigured discolored nails with subungual debris  from hallux to fifth toes bilaterally. No evidence of bacterial infection or drainage bilaterally.  Orthopedic  No limitations of motion  feet .  No crepitus or effusions noted.  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  Prominent metatarsal heads  B/L.  Skin  normotropic skin with no porokeratosis noted bilaterally.  No signs of infections or ulcers noted.     Onychomycosis  Pain in right toes  Pain in left toes  Consent was obtained for treatment procedures.   Mechanical debridement of nails 1-5  bilaterally performed with a nail nipper.  Filed with dremel without incident.     Return office visit    10   weeks                  Told patient to return for periodic foot care and evaluation due to potential at risk complications.   Helane Gunther DPM

## 2024-04-01 DIAGNOSIS — E1140 Type 2 diabetes mellitus with diabetic neuropathy, unspecified: Secondary | ICD-10-CM | POA: Diagnosis not present

## 2024-04-01 DIAGNOSIS — E785 Hyperlipidemia, unspecified: Secondary | ICD-10-CM | POA: Diagnosis not present

## 2024-04-01 DIAGNOSIS — I739 Peripheral vascular disease, unspecified: Secondary | ICD-10-CM | POA: Diagnosis not present

## 2024-04-01 DIAGNOSIS — I25118 Atherosclerotic heart disease of native coronary artery with other forms of angina pectoris: Secondary | ICD-10-CM | POA: Diagnosis not present

## 2024-04-01 DIAGNOSIS — I10 Essential (primary) hypertension: Secondary | ICD-10-CM | POA: Diagnosis not present

## 2024-04-01 NOTE — Progress Notes (Signed)
 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   Cancer (HCC)    Congenital deformity of ankle joint    Depression    a long time ago    Diabetes mellitus without complication (HCC)    type 2   Hyperlipidemia    Hypertension    Inguinal hernia    Prostate cancer (HCC)    PVC's (premature ventricular contractions)    PVC load 10.6% with NSVT up to 5 beats on heart monitor 01/2023   Skin cancer (melanoma) (HCC)    historical    Wears hearing aid    bialteral    Past Surgical History:  Procedure Laterality Date   INGUINAL HERNIA REPAIR     right and left hernias  miltiple times with mesh placement    INGUINAL HERNIA REPAIR Left 12/26/2018   Procedure: OPEN LEFT INGUINAL HERNIA WITH MESH;  Surgeon: Jay Matar, MD;  Location: [facility];  Service: General;  Laterality: Left;   nose myeloma     PROSTATECTOMY  2005   TOTAL HIP ARTHROPLASTY  "long time ago "   bilateral ; [facility]     Allergies  Allergies  Allergen Reactions   Lisinopril Cough and Other (See Comments)    Other reaction(s): cough     History of Present Illness    Jay Bailey has a PMH of coronary artery disease, hypertension, hyperlipidemia, PVCs, NSVT, and TIA.  He does have history of abnormal PET scan.  Echocardiogram 10/13 showed an EF of 50%, moderate LVH, G1 DD, trace aortic insufficiency.  He wore a cardiac event monitor 2/24 which showed sinus rhythm, minimum heart rate 42 and a maximum heart rate of 103, frequent PVCs and 4 episodes  of NSVT lasting 5 beats.  His cardiac PET stress test showed small territory of mid left circumflex ischemia and mild 3 times daily which was felt to be secondary to ectopy.  He was noted to have coronary calcifications.  EF was noted to be 49%.  He was seen in follow-up 4/24 due to abnormal stress testing.  He reported no cardiac symptoms at that time.  Medical management was recommended.  He underwent cardiac MRI 5/24 which showed an EF of 49% and diffuse hypokinesis, normal RV function and prior circumflex infarct.  He was seen in follow-up by Dr. Audery Blazing 09/19/2023.  He denied dyspnea.  He denied chest pain and palpitations.  He denied syncopal events.  Metoprolol  25 mg daily was added to his medication regimen due to his mildly reduced EF and PVCs.  His Imdur  was discontinued.  He presents to the clinic today for follow-up evaluation and states he continues to be somewhat physically active.  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  He has been going to the gym some as well.  He notes that he has not been needing his Lasix.  His weight today is 190 pounds.  We reviewed his past cardiac history.  He presents with  his sister-in-law.  They expressed understanding.  He does have some generalized bilateral lower extremity swelling today.  I asked him to take the Lasix tomorrow.  I will have him keep a weight log and plan follow-up in 6 months..  Today he denies chest pain, shortness of breath, lower extremity edema, fatigue, palpitations, melena, hematuria, hemoptysis, diaphoresis, weakness, presyncope, syncope, orthopnea, and PND.    Home Medications    Prior to Admission medications   Medication Sig Start Date End Date Taking? Authorizing Provider  ACCU-CHEK GUIDE test strip  10/15/20   [provider]  amLODipine  (NORVASC ) 5 MG tablet Take 1 tablet (5 mg total) by mouth daily. 02/19/24   Lenise Quince, MD  ascorbic acid (VITAMIN C) 1000 MG tablet Take 1,000 mg by mouth daily.     [provider]  aspirin  EC 81 MG tablet Take 81 mg by mouth. Per patient taking 2 tablets daily 12/31/14   [provider]  atorvastatin  (LIPITOR ) 80 MG tablet Take 80 mg by mouth daily.    [provider]  buPROPion  (WELLBUTRIN  XL) 150 MG 24 hr tablet Take 150 mg by mouth daily. 01/18/21   [provider]  folic acid  (FOLVITE ) 400 MCG tablet Take 400 mcg by mouth daily.     [provider]  furosemide (LASIX) 20 MG tablet Take 20 mg by mouth daily as needed. 01/09/24   [provider]  irbesartan  (AVAPRO ) 300 MG tablet Take 1 tablet (300 mg total) by mouth daily. 06/21/23 06/21/24  Lenise Quince, MD  metFORMIN  (GLUCOPHAGE ) 1000 MG tablet Take 1 tablet (1,000 mg total) by mouth 2 (two) times daily with a meal. 09/16/22 12/26/22  Doroteo Gasmen, MD  metoprolol  succinate (TOPROL  XL) 25 MG 24 hr tablet Take 1 tablet (25 mg total) by mouth at bedtime. 09/19/23   Lenise Quince, MD  Multiple Vitamin (MULTIVITAMIN) tablet Take 1 tablet by mouth daily.    [provider]  oxybutynin  (DITROPAN -XL) 5 MG 24 hr tablet Take 5 mg by mouth daily. 10/31/20   [provider]  vitamin E  400 UNIT capsule Take 400 Units by mouth daily.    [provider]    Family History    Family History  Problem Relation Age of Onset   Colon cancer Mother    Kidney cancer Mother    Heart disease Father    Lung cancer Brother    Pancreatic cancer Neg Hx    Prostate cancer Neg Hx    Breast cancer Neg Hx    He indicated that his mother is deceased. He indicated that his father is deceased. He indicated that his brother is deceased. He indicated that the status of his neg hx is unknown.  Social History    Social History   Socioeconomic History   Marital status: Single    Spouse name: Not on file   Number of children: Not on file   Years of education: Not on file   Highest education level: Not on file  Occupational History   Not  on file  Tobacco Use   Smoking status: Never   Smokeless tobacco: Never  Vaping Use   Vaping status: Never Used  Substance and Sexual Activity   Alcohol use: No   Drug use: No   Sexual activity: Not Currently  Other Topics Concern   Not on file  Social History Narrative   Not on file   Social Drivers of Health   Financial  Resource Strain: Not on file  Food Insecurity: Low Risk  (03/26/2024)   Received from [facility]   Hunger Vital Sign    Worried About Running Out of Food in the Last Year: Never true    Ran Out of Food in the Last Year: Never true  Transportation Needs: No Transportation Needs (03/26/2024)   Received from [facility]    In the past 12 months, has lack of reliable transportation kept you from medical appointments, meetings, work or from getting things needed for daily living? : No  Physical Activity: Not on file  Stress: Not on file  Social Connections: Not on file  Intimate Partner Violence: Not At Risk (09/15/2022)   Humiliation, Afraid, Rape, and Kick questionnaire    Fear of Current or Ex-Partner: No    Emotionally Abused: No    Physically Abused: No    Sexually Abused: No     Review of Systems    General:  No chills, fever, night sweats or weight changes.  Cardiovascular:  No chest pain, dyspnea on exertion, edema, orthopnea, palpitations, paroxysmal nocturnal dyspnea. Dermatological: No rash, lesions/masses Respiratory: No cough, dyspnea Urologic: No hematuria, dysuria Abdominal:   No nausea, vomiting, diarrhea, bright red blood per rectum, melena, or hematemesis Neurologic:  No visual changes, wkns, changes in mental status. All other systems reviewed and are otherwise negative except as noted above.  Physical Exam    VS:  BP 116/62   Pulse 94   Ht 5\' 8"  (1.727 m)   Wt 190 lb (86.2 kg)   SpO2 100%   BMI 28.89 kg/m  , BMI Body mass index is 28.89 kg/m. GEN: Well nourished, well developed, in no acute  distress. HEENT: normal. Neck: Supple, no JVD, carotid bruits, or masses. Cardiac: RRR, no murmurs, rubs, or gallops. No clubbing, cyanosis, edema.  Radials/DP/PT 2+ and equal bilaterally.  Respiratory:  Respirations regular and unlabored, clear to auscultation bilaterally. GI: Soft, nontender, nondistended, BS + x 4. MS: no deformity or atrophy. Skin: warm and dry, no rash. Neuro:  Strength and sensation are intact. Psych: Normal affect.  Accessory Clinical Findings    Recent Labs: 06/28/2023: ALT 27; BUN 19; Creatinine, Ser 1.12; Potassium 4.8; Sodium 139   Recent Lipid Panel    Component Value Date/Time   CHOL 125 06/28/2023 1422   TRIG 120 06/28/2023 1422   HDL 50 06/28/2023 1422   CHOLHDL 2.5 06/28/2023 1422   CHOLHDL 3.9 09/16/2022 0434   VLDL 44 (H) 09/16/2022 0434   LDLCALC 54 06/28/2023 1422         ECG personally reviewed by me today- EKG Interpretation Date/Time:  Thursday Apr 02 2024 15:52:12 EDT Ventricular Rate:  66 PR Interval:  132 QRS Duration:  88 QT Interval:  400 QTC Calculation: 419 R Axis:   -17  Text Interpretation: Sinus rhythm with occasional Premature ventricular complexes Minimal voltage criteria for LVH, may be normal variant ( R in aVL ) When compared with ECG of 08-Aug-2023 14:18, PREVIOUS ECG IS PRESENT Confirmed by Lawana Pray [phone]) on 04/02/2024 3:54:45 PM    Echocardiogram 09/16/2022  IMPRESSIONS     1. Left ventricular ejection fraction, by estimation, is 50%. The left  ventricle has low normal function. The left ventricle demonstrates global  hypokinesis. There is moderate left ventricular hypertrophy. Left  ventricular diastolic parameters are  consistent with Grade I diastolic dysfunction (impaired relaxation).   2. Right ventricular systolic function is normal. The right ventricular  size is normal. There is normal pulmonary artery systolic pressure. The  estimated right ventricular systolic pressure is 28.0 mmHg.   3.  The mitral valve is normal in structure. Trivial mitral valve  regurgitation. No evidence of mitral stenosis.   4. The aortic valve is tricuspid. There is mild calcification of the  aortic valve. Aortic valve regurgitation is trivial. No aortic stenosis is  present.   5. The inferior vena cava is normal in size with greater than 50%  respiratory variability, suggesting right atrial pressure of 3 mmHg.   FINDINGS   Left Ventricle: Left ventricular ejection fraction, by estimation, is  50%. The left ventricle has low normal function. The left ventricle  demonstrates global hypokinesis. The left ventricular internal cavity size  was normal in size. There is moderate  left ventricular hypertrophy. Left ventricular diastolic parameters are  consistent with Grade I diastolic dysfunction (impaired relaxation).   Right Ventricle: The right ventricular size is normal. No increase in  right ventricular wall thickness. Right ventricular systolic function is  normal. There is normal pulmonary artery systolic pressure. The tricuspid  regurgitant velocity is 2.50 m/s, and   with an assumed right atrial pressure of 3 mmHg, the estimated right  ventricular systolic pressure is 28.0 mmHg.   Left [facility]: Left atrial size was normal in size.   Right [facility]: Right atrial size was normal in size.   Pericardium: There is no evidence of pericardial effusion.   Mitral Valve: The mitral valve is normal in structure. Trivial mitral  valve regurgitation. No evidence of mitral valve stenosis. MV peak  gradient, 3.3 mmHg. The mean mitral valve gradient is 1.0 mmHg.   Tricuspid Valve: The tricuspid valve is normal in structure. Tricuspid  valve regurgitation is trivial. No evidence of tricuspid stenosis.   Aortic Valve: The aortic valve is tricuspid. There is mild calcification  of the aortic valve. Aortic valve regurgitation is trivial. No aortic  stenosis is present. Aortic valve mean gradient measures 4.0  mmHg. Aortic  valve peak gradient measures 8.1  mmHg. Aortic valve area, by VTI measures 2.83 cm.   Pulmonic Valve: The pulmonic valve was normal in structure. Pulmonic valve  regurgitation is trivial. No evidence of pulmonic stenosis.   Aorta: The aortic root is normal in size and structure.   Venous: The inferior vena cava is normal in size with greater than 50%  respiratory variability, suggesting right atrial pressure of 3 mmHg.   IAS/Shunts: No atrial level shunt detected by color flow Doppler. Agitated  saline contrast was given intravenously to evaluate for intracardiac  shunting.    Cardiac MRI 04/18/2023  FINDINGS: Limited images of the lung fields showed no gross abnormalities.   Prominent epicardial adipose tissue. Normal left ventricular size and wall thickness. Mild diffuse hypokinesis, EF 49%. Normal right ventricular size and systolic function, EF 55%. Normal left and right atrial sizes. Trileaflet aortic valve with no significant stenosis or and trivial aortic insufficiency. Mild mitral regurgitation with regurgitant fraction 17%.   On delayed enhancement imaging, there was an area of <50% wall thickness subendocardial late gadolinium enhancement (LGE) in the basal to mid inferolateral wall.   MEASUREMENTS: MEASUREMENTS LVEDV 181 mL   LVEDVi 89 mL/m2   LVSV 89 mL LVEF 49%   RVEDV 168 mL   RVEDVi 82 mL/m2   RVSV 93 RVEF 55%   Aortic forward volume 73 mL   Aortic regurgitant fraction 7%   T1 1021, normal.   IMPRESSION: 1.  Normal LV size with EF 49%, hypokinesis is diffuse.   2.  Normal RV size and systolic function, EF 55%.   3. Area of LGE in the inferolateral wall suggestive of circumflex territory infarction.   Dalton Mclean     Assessment & Plan   1.  Coronary artery disease-denies anginal symptoms.  Specifically denies exertional chest discomfort.  History of abnormal stress PET.  His Imdur  was discontinued.  He was started on  metoprolol .  He is tolerating this well.  He denies side effects. Heart healthy low-sodium diet Increase physical activity as tolerated Continue statin therapy, metoprolol , irbesartan   Mild LV dysfunction-no increased DOE or activity intolerance.  Cardiac MRI 5/24 showed an EF of 49% with diffuse hypokinesis and normal RV function with prior circumflex infarct. Continue irbesartan , metoprolol  Heart healthy low-sodium diet Daily weights Elevate lower extremities when not active  NSVT-denies recent accelerated or irregular heartbeats. Maintain physical activity Continue metoprolol   Essential hypertension-BP today 116/62. Maintain blood pressure log Heart healthy low-sodium diet-salty 6 diet sheet given  Hyperlipidemia-LDL 54 on 06/28/23. High-fiber diet Continue aspirin , atorvastatin    Disposition: Follow-up with Dr. Audery Blazing or me in 6 months.   Chet Cota. Omari Koslosky NP-C     04/02/2024, 4:09 PM [facility] [address] Office [phone] Fax [phone]    I spent 14 minutes examining this patient, reviewing medications, and using patient centered shared decision making involving their cardiac care.   I spent  20 minutes reviewing past medical history,  medications, and prior cardiac tests.

## 2024-04-02 ENCOUNTER — Ambulatory Visit: Attending: General Practice | Admitting: General Practice

## 2024-04-02 ENCOUNTER — Encounter: Payer: Self-pay | Admitting: General Practice

## 2024-04-02 VITALS — BP 116/62 | HR 94 | Ht 68.0 in | Wt 190.0 lb

## 2024-04-02 DIAGNOSIS — I2510 Atherosclerotic heart disease of native coronary artery without angina pectoris: Secondary | ICD-10-CM | POA: Insufficient documentation

## 2024-04-02 DIAGNOSIS — I5189 Other ill-defined heart diseases: Secondary | ICD-10-CM | POA: Insufficient documentation

## 2024-04-02 DIAGNOSIS — I10 Essential (primary) hypertension: Secondary | ICD-10-CM | POA: Insufficient documentation

## 2024-04-02 DIAGNOSIS — E785 Hyperlipidemia, unspecified: Secondary | ICD-10-CM | POA: Insufficient documentation

## 2024-04-02 DIAGNOSIS — I4729 Other ventricular tachycardia: Secondary | ICD-10-CM | POA: Insufficient documentation

## 2024-04-02 NOTE — Patient Instructions (Signed)
 Medication Instructions:  TAKE YOUR LASIX TOMORROW IN THE AM AND IF YOU GAIN >3 lbs/OVERNIGHT -OR- >5lbs/WEEKLY *If you need a refill on your cardiac medications before your next appointment, please call your pharmacy*  Lab Work:  Testing/Procedures: NONE   NONE  Other Instructions NO SALT SUBSTITUTE INCREASE PHYSICAL ACTIVITY AS TOLERATED TAKE AND LOG YOUR WEIGHT  Follow-Up: At [facility], you and your health needs are our priority.  As part of our continuing mission to provide you with exceptional heart care, our providers are all part of one team.  This team includes your primary Cardiologist (physician) and Advanced Practice Providers or APPs (Physician Assistants and Nurse Practitioners) who all work together to provide you with the care you need, when you need it.  Your next appointment:   6 month(s)  Provider:   Alexandria Angel, MD

## 2024-04-28 DIAGNOSIS — I739 Peripheral vascular disease, unspecified: Secondary | ICD-10-CM | POA: Diagnosis not present

## 2024-04-28 DIAGNOSIS — I25118 Atherosclerotic heart disease of native coronary artery with other forms of angina pectoris: Secondary | ICD-10-CM | POA: Diagnosis not present

## 2024-04-28 DIAGNOSIS — I10 Essential (primary) hypertension: Secondary | ICD-10-CM | POA: Diagnosis not present

## 2024-04-28 DIAGNOSIS — E1140 Type 2 diabetes mellitus with diabetic neuropathy, unspecified: Secondary | ICD-10-CM | POA: Diagnosis not present

## 2024-05-02 DIAGNOSIS — I739 Peripheral vascular disease, unspecified: Secondary | ICD-10-CM | POA: Diagnosis not present

## 2024-05-02 DIAGNOSIS — I10 Essential (primary) hypertension: Secondary | ICD-10-CM | POA: Diagnosis not present

## 2024-05-02 DIAGNOSIS — I25118 Atherosclerotic heart disease of native coronary artery with other forms of angina pectoris: Secondary | ICD-10-CM | POA: Diagnosis not present

## 2024-05-02 DIAGNOSIS — E1140 Type 2 diabetes mellitus with diabetic neuropathy, unspecified: Secondary | ICD-10-CM | POA: Diagnosis not present

## 2024-05-02 DIAGNOSIS — E785 Hyperlipidemia, unspecified: Secondary | ICD-10-CM | POA: Diagnosis not present

## 2024-05-27 ENCOUNTER — Ambulatory Visit (INDEPENDENT_AMBULATORY_CARE_PROVIDER_SITE_OTHER): Admitting: Diagnostic Neuroimaging

## 2024-05-27 ENCOUNTER — Telehealth: Payer: Self-pay

## 2024-05-27 ENCOUNTER — Encounter: Payer: Self-pay | Admitting: Diagnostic Neuroimaging

## 2024-05-27 VITALS — BP 160/78 | HR 51 | Ht 67.0 in | Wt 219.0 lb

## 2024-05-27 DIAGNOSIS — Z0001 Encounter for general adult medical examination with abnormal findings: Secondary | ICD-10-CM | POA: Diagnosis not present

## 2024-05-27 DIAGNOSIS — H02403 Unspecified ptosis of bilateral eyelids: Secondary | ICD-10-CM | POA: Diagnosis not present

## 2024-05-27 DIAGNOSIS — H519 Unspecified disorder of binocular movement: Secondary | ICD-10-CM | POA: Diagnosis not present

## 2024-05-27 DIAGNOSIS — Z79899 Other long term (current) drug therapy: Secondary | ICD-10-CM | POA: Diagnosis not present

## 2024-05-27 DIAGNOSIS — R799 Abnormal finding of blood chemistry, unspecified: Secondary | ICD-10-CM | POA: Diagnosis not present

## 2024-05-27 NOTE — Telephone Encounter (Signed)
 MG Panel recieved, copy placed on MD desk for review.

## 2024-05-27 NOTE — Patient Instructions (Signed)
-   check myasthenia gravis antibody panel, labs

## 2024-05-27 NOTE — Progress Notes (Signed)
 [facility]  PATIENT: Jay Bailey DOB: 30-Aug-1941  REFERRING CLINICIAN: Robinson Mayo, OD HISTORY FROM: patient REASON FOR VISIT: new consult   HISTORICAL  CHIEF COMPLAINT:  Chief Complaint  Patient presents with   Eye Problem    Rm 6 with sister in law  Pt is well, reports he is having blurred vision in R eye. He is having lid drooping in L eye.  Symptoms have been persistent in the last year.     HISTORY OF PRESENT ILLNESS:   83 year old male here for evaluation of ptosis and eye movement abnormalities.  Symptoms started about 2 years ago and have progressively worsened over time.  Fluctuating ptosis of the left eye, sometimes the right eye.  Denies any blurred vision or double vision.  Denies any speech or swallowing difficulties.  Has some issues with lower extremity weakness that has progressed worsened over the last couple of years.  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   REVIEW OF SYSTEMS: Full 14 system review of systems performed and negative with exception of: as per HPI.  ALLERGIES: Allergies  Allergen Reactions   Lisinopril Cough and Other (See Comments)    Other reaction(s): cough     HOME MEDICATIONS: Outpatient Medications Prior to Visit  Medication Sig Dispense Refill   ACCU-CHEK GUIDE test strip      amLODipine  (NORVASC ) 5 MG tablet Take 1 tablet (5 mg total) by mouth daily. 90 tablet 2   ascorbic acid (VITAMIN C) 1000 MG tablet Take 1,000 mg by mouth daily.     aspirin  EC 81 MG tablet Take 81 mg by mouth. Per patient taking 2 tablets daily     atorvastatin  (LIPITOR ) 80 MG tablet Take 80 mg by mouth daily.     buPROPion  (WELLBUTRIN  XL) 150 MG 24 hr tablet Take 150 mg by mouth daily.     folic acid  (FOLVITE ) 400 MCG tablet Take 400 mcg by mouth daily.      furosemide (LASIX) 20 MG tablet Take 20 mg by mouth daily as needed.     hydrochlorothiazide (HYDRODIURIL) 12.5 MG tablet Take 12.5 mg by  mouth every morning.     irbesartan  (AVAPRO ) 300 MG tablet Take 1 tablet (300 mg total) by mouth daily. 90 tablet 3   metFORMIN  (GLUCOPHAGE ) 1000 MG tablet Take 1 tablet (1,000 mg total) by mouth 2 (two) times daily with a meal. 60 tablet 1   metoprolol  succinate (TOPROL  XL) 25 MG 24 hr tablet Take 1 tablet (25 mg total) by mouth at bedtime. 90 tablet 3   Multiple Vitamin (MULTIVITAMIN) tablet Take 1 tablet by mouth daily.     oxybutynin  (DITROPAN -XL) 5 MG 24 hr tablet Take 5 mg by mouth daily.     vitamin E  400 UNIT capsule Take 400 Units by mouth daily.     No facility-administered medications prior to visit.    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   Cancer (HCC)    Congenital deformity of ankle joint    Depression    a long time ago    Diabetes mellitus without complication (HCC)    type 2   Hyperlipidemia    Hypertension    Inguinal hernia    Prostate cancer (HCC)  PVC's (premature ventricular contractions)    PVC load 10.6% with NSVT up to 5 beats on heart monitor 01/2023   Skin cancer (melanoma) (HCC)    historical    Wears hearing aid    bialteral     PAST SURGICAL HISTORY: Past Surgical History:  Procedure Laterality Date   INGUINAL HERNIA REPAIR     right and left hernias  miltiple times with mesh placement    INGUINAL HERNIA REPAIR Left 12/26/2018   Procedure: OPEN LEFT INGUINAL HERNIA WITH MESH;  Surgeon: Gladis Cough, MD;  Location: [facility];  Service: General;  Laterality: Left;   nose myeloma     PROSTATECTOMY  2005   TOTAL HIP ARTHROPLASTY  long time ago    bilateral ; [facility]     FAMILY HISTORY: Family History  Problem Relation Age of Onset   Colon cancer Mother    Kidney cancer Mother    Heart disease Father    Lung cancer Brother    Pancreatic cancer Neg Hx    Prostate cancer Neg Hx    Breast  cancer Neg Hx     SOCIAL HISTORY: Social History   Socioeconomic History   Marital status: Single    Spouse name: Not on file   Number of children: 0   Years of education: Not on file   Highest education level: Not on file  Occupational History   Occupation: retired  Tobacco Use   Smoking status: Never   Smokeless tobacco: Never  Vaping Use   Vaping status: Never Used  Substance and Sexual Activity   Alcohol use: No   Drug use: No   Sexual activity: Not Currently  Other Topics Concern   Not on file  Social History Narrative   2025- R handed   [facility] at [facility]    Social Drivers of Health   Financial Resource Strain: Not on file  Food Insecurity: Low Risk  (03/26/2024)   Received from [facility]   Hunger Vital Sign    Within the past 12 months, you worried that your food would run out before you got money to buy more: Never true    Within the past 12 months, the food you bought just didn't last and you didn't have money to get more. : Never true  Transportation Needs: No Transportation Needs (03/26/2024)   Received from [facility]    In the past 12 months, has lack of reliable transportation kept you from medical appointments, meetings, work or from getting things needed for daily living? : No  Physical Activity: Not on file  Stress: Not on file  Social Connections: Not on file  Intimate Partner Violence: Not At Risk (09/15/2022)   Humiliation, Afraid, Rape, and Kick questionnaire    Fear of Current or Ex-Partner: No    Emotionally Abused: No    Physically Abused: No    Sexually Abused: No     PHYSICAL EXAM  GENERAL EXAM/CONSTITUTIONAL: Vitals:  Vitals:   05/27/24 1043  BP: (!) 159/73  Pulse: (!) 51  Weight: 219 lb (99.3 kg)  Height: 5' 7 (1.702 m)   Body mass index is 34.3 kg/m. Wt Readings from Last 3 Encounters:  05/27/24 219 lb (99.3 kg)  04/02/24 190 lb (86.2 kg)  01/01/24 203 lb (92.1 kg)   Patient  is in no distress; well developed, nourished and groomed; neck is supple  CARDIOVASCULAR: Examination of carotid arteries is normal; no carotid bruits Regular rate  and rhythm, no murmurs Examination of peripheral vascular system by observation and palpation is normal  EYES: Ophthalmoscopic exam of optic discs and posterior segments is normal; no papilledema or hemorrhages Vision Screening   Right eye Left eye Both eyes  Without correction 20/50 20/70 20/30   With correction       MUSCULOSKELETAL: Gait, strength, tone, movements noted in Neurologic exam below  NEUROLOGIC: MENTAL STATUS:      No data to display         awake, alert, oriented to person, place and time recent and remote memory intact normal attention and concentration language fluent, comprehension intact, naming intact fund of knowledge appropriate  CRANIAL NERVE:  2nd - no papilledema on fundoscopic exam 2nd, 3rd, 4th, 6th - pupils equal and reactive to light, visual fields full to confrontation, extraocular muscles --> DECR UPGAZE, DECR LEFT EYE ADDUCTION ON RIGHT GAZE, no nystagmus; MILD LEFT > RIGHT PTOSIS; DENIES DOUBLE OR BLURRED VISION 5th - facial sensation symmetric 7th - facial strength symmetric 8th - 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  Lab Results  Component Value Date   WBC 6.5 09/14/2022   HGB 13.6 09/14/2022   HCT 40.0 09/14/2022   MCV 98.2  09/14/2022   PLT 180 09/14/2022      Component Value Date/Time   NA 139 06/28/2023 1422   K 4.8 06/28/2023 1422   CL 102 06/28/2023 1422   CO2 24 06/28/2023 1422   GLUCOSE 112 (H) 06/28/2023 1422   GLUCOSE 141 (H) 09/14/2022 2029   BUN 19 06/28/2023 1422   CREATININE 1.12 06/28/2023 1422   CALCIUM  9.8 06/28/2023 1422   PROT 7.1 06/28/2023 1422   ALBUMIN 4.7 06/28/2023 1422   AST 21 06/28/2023 1422   ALT 27 06/28/2023 1422   ALKPHOS 119 06/28/2023 1422   BILITOT 0.5 06/28/2023 1422   GFRNONAA >60 09/14/2022 2027   GFRAA 48 (L) 02/28/2020 0014   Lab Results  Component Value Date   CHOL 125 06/28/2023   HDL 50 06/28/2023   LDLCALC 54 06/28/2023   TRIG 120 06/28/2023   CHOLHDL 2.5 06/28/2023   Lab Results  Component Value Date   HGBA1C 7.6 (H) 09/16/2022   No results found for: VITAMINB12 Lab Results  Component Value Date   TSH 1.770 01/28/2023    09/15/22 MRI brain  1. No acute brain finding. 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 2. Extensive bilateral mastoid effusions and middle ear fluid left more than right that could possibly be symptomatic.    ASSESSMENT AND PLAN  83 y.o. year old male here with:   Dx:  1. Ptosis of both eyelids   2. Eye movement abnormality     PLAN:  PTOSIS, EYE MOVEMENT LIMITATION (chronic since ~2023?; ocular myasthenia gravis, myopathy, thyroid  eye disease vs chronic progressive external ophthalmoplegia) - check myasthenia gravis antibody panel, labs   Orders Placed This Encounter  Procedures   AChR Abs with Reflex to MuSK  CK   Aldolase   TSH Rfx on Abnormal to Free T4   Return for pending if symptoms worsen or fail to improve, pending test results.    EDUARD FABIENE HANLON, MD 05/27/2024, 11:21 AM Certified in Neurology, Neurophysiology and Neuroimaging  [facility] [address], [address] [place], [place] [address] [phone]

## 2024-05-29 DIAGNOSIS — I10 Essential (primary) hypertension: Secondary | ICD-10-CM | POA: Diagnosis not present

## 2024-05-29 DIAGNOSIS — I25118 Atherosclerotic heart disease of native coronary artery with other forms of angina pectoris: Secondary | ICD-10-CM | POA: Diagnosis not present

## 2024-05-29 DIAGNOSIS — E1140 Type 2 diabetes mellitus with diabetic neuropathy, unspecified: Secondary | ICD-10-CM | POA: Diagnosis not present

## 2024-05-29 DIAGNOSIS — I739 Peripheral vascular disease, unspecified: Secondary | ICD-10-CM | POA: Diagnosis not present

## 2024-06-01 DIAGNOSIS — I10 Essential (primary) hypertension: Secondary | ICD-10-CM | POA: Diagnosis not present

## 2024-06-01 DIAGNOSIS — I739 Peripheral vascular disease, unspecified: Secondary | ICD-10-CM | POA: Diagnosis not present

## 2024-06-01 DIAGNOSIS — E1140 Type 2 diabetes mellitus with diabetic neuropathy, unspecified: Secondary | ICD-10-CM | POA: Diagnosis not present

## 2024-06-01 DIAGNOSIS — I25118 Atherosclerotic heart disease of native coronary artery with other forms of angina pectoris: Secondary | ICD-10-CM | POA: Diagnosis not present

## 2024-06-01 DIAGNOSIS — E785 Hyperlipidemia, unspecified: Secondary | ICD-10-CM | POA: Diagnosis not present

## 2024-06-08 ENCOUNTER — Encounter: Payer: Self-pay | Admitting: Podiatry

## 2024-06-08 ENCOUNTER — Ambulatory Visit (INDEPENDENT_AMBULATORY_CARE_PROVIDER_SITE_OTHER): Payer: Medicare Other | Admitting: Podiatry

## 2024-06-08 DIAGNOSIS — E119 Type 2 diabetes mellitus without complications: Secondary | ICD-10-CM

## 2024-06-08 DIAGNOSIS — M79676 Pain in unspecified toe(s): Secondary | ICD-10-CM

## 2024-06-08 DIAGNOSIS — B351 Tinea unguium: Secondary | ICD-10-CM

## 2024-06-08 NOTE — Progress Notes (Signed)
 This patient returns to my office for at risk foot care.  This patient requires this care by a professional since this patient will be at risk due to having  Diabetes.   This patient is unable to cut nails himself since the patient cannot reach his nails.These nails are painful walking and wearing shoes.  He says there is no pain in his swollen feet.   This patient presents for at risk foot care today.  General Appearance  Alert, conversant and in no acute stress.  Vascular  Dorsalis pedis and posterior tibial  pulses are weakly  palpable  bilaterally.  Capillary return is within normal limits  bilaterally. Temperature is within normal limits  Bilaterally.  Venous stasis both legs.  Swelling feet.  Neurologic  Senn-Weinstein monofilament wire test within normal limits  bilaterally. Muscle power within normal limits bilaterally.  Nails Thick disfigured discolored nails with subungual debris  from hallux to fifth toes bilaterally. No evidence of bacterial infection or drainage bilaterally.  Orthopedic  No limitations of motion  feet .  No crepitus or effusions noted.  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  Prominent metatarsal heads  B/L.  Skin  normotropic skin with no porokeratosis noted bilaterally.  No signs of infections or ulcers noted.     Onychomycosis  Pain in right toes  Pain in left toes  Consent was obtained for treatment procedures.   Mechanical debridement of nails 1-5  bilaterally performed with a nail nipper.  Filed with dremel without incident.     Return office visit    10   weeks                  Told patient to return for periodic foot care and evaluation due to potential at risk complications.   Helane Gunther DPM

## 2024-06-09 ENCOUNTER — Ambulatory Visit: Payer: Self-pay | Admitting: Neurology

## 2024-06-11 LAB — CK: Total CK: 113 U/L (ref 41–331)

## 2024-06-11 LAB — MUSK ANTIBODIES: MuSK Antibodies: <1 U/mL

## 2024-06-11 LAB — ACHR ABS WITH REFLEX TO MUSK: AChR Binding Ab, Serum: <0.07 nmol/L (ref 0.00–0.24)

## 2024-06-11 LAB — ALDOLASE: Aldolase: 3.5 U/L (ref 3.3–10.3)

## 2024-06-11 LAB — TSH RFX ON ABNORMAL TO FREE T4: TSH: 2.06 u[IU]/mL (ref 0.450–4.500)

## 2024-06-16 NOTE — Telephone Encounter (Signed)
 Sister returned call, informed her of the results below that I left on pts VM. She was unaware I called him, req primary contact number be changed to hers.  She verbally understood and was appreciative of the results.  Advised to call the office back with any questions or concerns.

## 2024-06-16 NOTE — Telephone Encounter (Signed)
 Contacted pt due to not reading [facility] msg. LVM per DPR informing him Results are good, no major findings. Continue current plan per MD. Number provided to call back with any questions or concerns.

## 2024-06-18 DIAGNOSIS — E785 Hyperlipidemia, unspecified: Secondary | ICD-10-CM | POA: Diagnosis not present

## 2024-06-18 DIAGNOSIS — I10 Essential (primary) hypertension: Secondary | ICD-10-CM | POA: Diagnosis not present

## 2024-06-18 DIAGNOSIS — E1140 Type 2 diabetes mellitus with diabetic neuropathy, unspecified: Secondary | ICD-10-CM | POA: Diagnosis not present

## 2024-06-19 ENCOUNTER — Other Ambulatory Visit: Payer: Self-pay | Admitting: Cardiology

## 2024-06-19 DIAGNOSIS — I10 Essential (primary) hypertension: Secondary | ICD-10-CM

## 2024-06-30 ENCOUNTER — Encounter (INDEPENDENT_AMBULATORY_CARE_PROVIDER_SITE_OTHER): Payer: Self-pay | Admitting: Otolaryngology

## 2024-06-30 ENCOUNTER — Ambulatory Visit (INDEPENDENT_AMBULATORY_CARE_PROVIDER_SITE_OTHER): Payer: Medicare Other | Admitting: Otolaryngology

## 2024-06-30 VITALS — BP 114/61 | HR 56

## 2024-06-30 DIAGNOSIS — H6123 Impacted cerumen, bilateral: Secondary | ICD-10-CM

## 2024-06-30 NOTE — Progress Notes (Signed)
 Patient ID: Jay Bailey, male   DOB: 04-13-1941, 83 y.o.   MRN: [id number]  Procedure: Bilateral cerumen disimpaction.   Indication: Cerumen impaction, resulting in ear discomfort and conductive hearing loss.   Description: The patient is placed supine on the operating table. Under the operating microscope, the right ear canal is examined and is noted to be impacted with cerumen. The cerumen is carefully removed with a combination of suction catheters, cerumen curette, and alligator forceps. After the cerumen removal, the ear canal and tympanic membrane are noted to be normal. No middle ear effusion is noted. The same procedure is then repeated on the left side without exception. The patient tolerated the procedure well.  Follow-up care:  The patient is instructed not to use Q-tips to clean the ear canals. The patient will follow up in 6 months.

## 2024-07-02 DIAGNOSIS — E1140 Type 2 diabetes mellitus with diabetic neuropathy, unspecified: Secondary | ICD-10-CM | POA: Diagnosis not present

## 2024-07-02 DIAGNOSIS — I10 Essential (primary) hypertension: Secondary | ICD-10-CM | POA: Diagnosis not present

## 2024-07-02 DIAGNOSIS — I25118 Atherosclerotic heart disease of native coronary artery with other forms of angina pectoris: Secondary | ICD-10-CM | POA: Diagnosis not present

## 2024-07-02 DIAGNOSIS — E785 Hyperlipidemia, unspecified: Secondary | ICD-10-CM | POA: Diagnosis not present

## 2024-07-02 DIAGNOSIS — I739 Peripheral vascular disease, unspecified: Secondary | ICD-10-CM | POA: Diagnosis not present

## 2024-07-08 DIAGNOSIS — L578 Other skin changes due to chronic exposure to nonionizing radiation: Secondary | ICD-10-CM | POA: Diagnosis not present

## 2024-07-08 DIAGNOSIS — I872 Venous insufficiency (chronic) (peripheral): Secondary | ICD-10-CM | POA: Diagnosis not present

## 2024-07-08 DIAGNOSIS — D2272 Melanocytic nevi of left lower limb, including hip: Secondary | ICD-10-CM | POA: Diagnosis not present

## 2024-07-08 DIAGNOSIS — L821 Other seborrheic keratosis: Secondary | ICD-10-CM | POA: Diagnosis not present

## 2024-07-08 DIAGNOSIS — L570 Actinic keratosis: Secondary | ICD-10-CM | POA: Diagnosis not present

## 2024-07-08 DIAGNOSIS — Z85820 Personal history of malignant melanoma of skin: Secondary | ICD-10-CM | POA: Diagnosis not present

## 2024-07-08 DIAGNOSIS — D225 Melanocytic nevi of trunk: Secondary | ICD-10-CM | POA: Diagnosis not present

## 2024-07-23 DIAGNOSIS — I10 Essential (primary) hypertension: Secondary | ICD-10-CM | POA: Diagnosis not present

## 2024-07-23 DIAGNOSIS — Z0000 Encounter for general adult medical examination without abnormal findings: Secondary | ICD-10-CM | POA: Diagnosis not present

## 2024-07-23 DIAGNOSIS — E1140 Type 2 diabetes mellitus with diabetic neuropathy, unspecified: Secondary | ICD-10-CM | POA: Diagnosis not present

## 2024-07-23 DIAGNOSIS — Z8673 Personal history of transient ischemic attack (TIA), and cerebral infarction without residual deficits: Secondary | ICD-10-CM | POA: Diagnosis not present

## 2024-07-23 DIAGNOSIS — E669 Obesity, unspecified: Secondary | ICD-10-CM | POA: Diagnosis not present

## 2024-07-23 DIAGNOSIS — Z85820 Personal history of malignant melanoma of skin: Secondary | ICD-10-CM | POA: Diagnosis not present

## 2024-07-23 DIAGNOSIS — I739 Peripheral vascular disease, unspecified: Secondary | ICD-10-CM | POA: Diagnosis not present

## 2024-07-23 DIAGNOSIS — F325 Major depressive disorder, single episode, in full remission: Secondary | ICD-10-CM | POA: Diagnosis not present

## 2024-07-23 DIAGNOSIS — E785 Hyperlipidemia, unspecified: Secondary | ICD-10-CM | POA: Diagnosis not present

## 2024-07-23 DIAGNOSIS — Z23 Encounter for immunization: Secondary | ICD-10-CM | POA: Diagnosis not present

## 2024-07-23 DIAGNOSIS — I25118 Atherosclerotic heart disease of native coronary artery with other forms of angina pectoris: Secondary | ICD-10-CM | POA: Diagnosis not present

## 2024-07-23 DIAGNOSIS — C61 Malignant neoplasm of prostate: Secondary | ICD-10-CM | POA: Diagnosis not present

## 2024-08-02 DIAGNOSIS — E785 Hyperlipidemia, unspecified: Secondary | ICD-10-CM | POA: Diagnosis not present

## 2024-08-02 DIAGNOSIS — E1140 Type 2 diabetes mellitus with diabetic neuropathy, unspecified: Secondary | ICD-10-CM | POA: Diagnosis not present

## 2024-08-02 DIAGNOSIS — I10 Essential (primary) hypertension: Secondary | ICD-10-CM | POA: Diagnosis not present

## 2024-08-02 DIAGNOSIS — I739 Peripheral vascular disease, unspecified: Secondary | ICD-10-CM | POA: Diagnosis not present

## 2024-08-02 DIAGNOSIS — I25118 Atherosclerotic heart disease of native coronary artery with other forms of angina pectoris: Secondary | ICD-10-CM | POA: Diagnosis not present

## 2024-08-10 ENCOUNTER — Encounter: Payer: Self-pay | Admitting: Podiatry

## 2024-08-10 ENCOUNTER — Ambulatory Visit (INDEPENDENT_AMBULATORY_CARE_PROVIDER_SITE_OTHER): Admitting: Podiatry

## 2024-08-10 DIAGNOSIS — D225 Melanocytic nevi of trunk: Secondary | ICD-10-CM | POA: Insufficient documentation

## 2024-08-10 DIAGNOSIS — B351 Tinea unguium: Secondary | ICD-10-CM

## 2024-08-10 DIAGNOSIS — E119 Type 2 diabetes mellitus without complications: Secondary | ICD-10-CM | POA: Diagnosis not present

## 2024-08-10 DIAGNOSIS — Z85820 Personal history of malignant melanoma of skin: Secondary | ICD-10-CM | POA: Insufficient documentation

## 2024-08-10 DIAGNOSIS — D2370 Other benign neoplasm of skin of unspecified lower limb, including hip: Secondary | ICD-10-CM | POA: Insufficient documentation

## 2024-08-10 DIAGNOSIS — C8409 Mycosis fungoides, extranodal and solid organ sites: Secondary | ICD-10-CM | POA: Insufficient documentation

## 2024-08-10 DIAGNOSIS — M79676 Pain in unspecified toe(s): Secondary | ICD-10-CM

## 2024-08-10 DIAGNOSIS — I872 Venous insufficiency (chronic) (peripheral): Secondary | ICD-10-CM | POA: Insufficient documentation

## 2024-08-10 DIAGNOSIS — L821 Other seborrheic keratosis: Secondary | ICD-10-CM | POA: Insufficient documentation

## 2024-08-10 DIAGNOSIS — L719 Rosacea, unspecified: Secondary | ICD-10-CM | POA: Insufficient documentation

## 2024-08-10 NOTE — Progress Notes (Signed)
 This patient returns to my office for at risk foot care.  This patient requires this care by a professional since this patient will be at risk due to having  Diabetes.   This patient is unable to cut nails himself since the patient cannot reach his nails.These nails are painful walking and wearing shoes.  He says there is no pain in his swollen feet.   This patient presents for at risk foot care today.  General Appearance  Alert, conversant and in no acute stress.  Vascular  Dorsalis pedis and posterior tibial  pulses are weakly  palpable  bilaterally.  Capillary return is within normal limits  bilaterally. Temperature is within normal limits  Bilaterally.  Venous stasis both legs.  Swelling feet.  Neurologic  Senn-Weinstein monofilament wire test within normal limits  bilaterally. Muscle power within normal limits bilaterally.  Nails Thick disfigured discolored nails with subungual debris  from hallux to fifth toes bilaterally. No evidence of bacterial infection or drainage bilaterally.  Orthopedic  No limitations of motion  feet .  No crepitus or effusions noted.  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  Prominent metatarsal heads  B/L.  Skin  normotropic skin with no porokeratosis noted bilaterally.  No signs of infections or ulcers noted.     Onychomycosis  Pain in right toes  Pain in left toes  Consent was obtained for treatment procedures.   Mechanical debridement of nails 1-5  bilaterally performed with a nail nipper.  Filed with dremel without incident.     Return office visit    10   weeks                  Told patient to return for periodic foot care and evaluation due to potential at risk complications.   Helane Gunther DPM

## 2024-08-19 DIAGNOSIS — Z6833 Body mass index (BMI) 33.0-33.9, adult: Secondary | ICD-10-CM | POA: Diagnosis not present

## 2024-08-19 DIAGNOSIS — E1140 Type 2 diabetes mellitus with diabetic neuropathy, unspecified: Secondary | ICD-10-CM | POA: Diagnosis not present

## 2024-08-19 DIAGNOSIS — E669 Obesity, unspecified: Secondary | ICD-10-CM | POA: Diagnosis not present

## 2024-08-19 DIAGNOSIS — E785 Hyperlipidemia, unspecified: Secondary | ICD-10-CM | POA: Diagnosis not present

## 2024-08-19 DIAGNOSIS — I10 Essential (primary) hypertension: Secondary | ICD-10-CM | POA: Diagnosis not present

## 2024-08-20 DIAGNOSIS — R051 Acute cough: Secondary | ICD-10-CM | POA: Diagnosis not present

## 2024-08-20 DIAGNOSIS — Z20822 Contact with and (suspected) exposure to covid-19: Secondary | ICD-10-CM | POA: Diagnosis not present

## 2024-08-20 DIAGNOSIS — J111 Influenza due to unidentified influenza virus with other respiratory manifestations: Secondary | ICD-10-CM | POA: Diagnosis not present

## 2024-09-01 DIAGNOSIS — I25118 Atherosclerotic heart disease of native coronary artery with other forms of angina pectoris: Secondary | ICD-10-CM | POA: Diagnosis not present

## 2024-09-01 DIAGNOSIS — E785 Hyperlipidemia, unspecified: Secondary | ICD-10-CM | POA: Diagnosis not present

## 2024-09-01 DIAGNOSIS — E1140 Type 2 diabetes mellitus with diabetic neuropathy, unspecified: Secondary | ICD-10-CM | POA: Diagnosis not present

## 2024-09-01 DIAGNOSIS — I10 Essential (primary) hypertension: Secondary | ICD-10-CM | POA: Diagnosis not present

## 2024-09-18 DIAGNOSIS — Z23 Encounter for immunization: Secondary | ICD-10-CM | POA: Diagnosis not present

## 2024-09-29 DIAGNOSIS — Z23 Encounter for immunization: Secondary | ICD-10-CM | POA: Diagnosis not present

## 2024-10-01 DIAGNOSIS — N3946 Mixed incontinence: Secondary | ICD-10-CM | POA: Diagnosis not present

## 2024-10-01 DIAGNOSIS — C61 Malignant neoplasm of prostate: Secondary | ICD-10-CM | POA: Diagnosis not present

## 2024-10-01 DIAGNOSIS — R972 Elevated prostate specific antigen [PSA]: Secondary | ICD-10-CM | POA: Diagnosis not present

## 2024-10-02 DIAGNOSIS — E1140 Type 2 diabetes mellitus with diabetic neuropathy, unspecified: Secondary | ICD-10-CM | POA: Diagnosis not present

## 2024-10-02 DIAGNOSIS — E785 Hyperlipidemia, unspecified: Secondary | ICD-10-CM | POA: Diagnosis not present

## 2024-10-02 DIAGNOSIS — I10 Essential (primary) hypertension: Secondary | ICD-10-CM | POA: Diagnosis not present

## 2024-10-02 DIAGNOSIS — I739 Peripheral vascular disease, unspecified: Secondary | ICD-10-CM | POA: Diagnosis not present

## 2024-10-02 DIAGNOSIS — I25118 Atherosclerotic heart disease of native coronary artery with other forms of angina pectoris: Secondary | ICD-10-CM | POA: Diagnosis not present

## 2024-10-12 ENCOUNTER — Encounter: Payer: Self-pay | Admitting: Podiatry

## 2024-10-12 ENCOUNTER — Ambulatory Visit: Admitting: Podiatry

## 2024-10-12 DIAGNOSIS — M79676 Pain in unspecified toe(s): Secondary | ICD-10-CM | POA: Diagnosis not present

## 2024-10-12 DIAGNOSIS — E119 Type 2 diabetes mellitus without complications: Secondary | ICD-10-CM

## 2024-10-12 DIAGNOSIS — B351 Tinea unguium: Secondary | ICD-10-CM

## 2024-10-12 NOTE — Progress Notes (Signed)
This patient returns to my office for at risk foot care.  This patient requires this care by a professional since this patient will be at risk due to having  Diabetes.   This patient is unable to cut nails himself since the patient cannot reach his nails.These nails are painful walking and wearing shoes.  He says there is no pain in his swollen feet.   This patient presents for at risk foot care today.  General Appearance  Alert, conversant and in no acute stress.  Vascular  Dorsalis pedis and posterior tibial  pulses are weakly  palpable  bilaterally.  Capillary return is within normal limits  bilaterally. Temperature is within normal limits  Bilaterally.  Venous stasis both legs.  Swelling feet.  Neurologic  Senn-Weinstein monofilament wire test within normal limits  bilaterally. Muscle power within normal limits bilaterally.  Nails Thick disfigured discolored nails with subungual debris  from hallux to fifth toes bilaterally. No evidence of bacterial infection or drainage bilaterally.  Orthopedic  No limitations of motion  feet .  No crepitus or effusions noted.  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  Prominent metatarsal heads  B/L.  Skin  normotropic skin with no porokeratosis noted bilaterally.  No signs of infections or ulcers noted.     Onychomycosis  Pain in right toes  Pain in left toes  Consent was obtained for treatment procedures.   Mechanical debridement of nails 1-5  bilaterally performed with a nail nipper.  Filed with dremel without incident.     Return office visit    9   weeks                  Told patient to return for periodic foot care and evaluation due to potential at risk complications.   Helane Gunther DPM

## 2024-11-01 DIAGNOSIS — I25118 Atherosclerotic heart disease of native coronary artery with other forms of angina pectoris: Secondary | ICD-10-CM | POA: Diagnosis not present

## 2024-11-01 DIAGNOSIS — I739 Peripheral vascular disease, unspecified: Secondary | ICD-10-CM | POA: Diagnosis not present

## 2024-11-01 DIAGNOSIS — E785 Hyperlipidemia, unspecified: Secondary | ICD-10-CM | POA: Diagnosis not present

## 2024-11-01 DIAGNOSIS — I10 Essential (primary) hypertension: Secondary | ICD-10-CM | POA: Diagnosis not present

## 2024-11-01 DIAGNOSIS — E1140 Type 2 diabetes mellitus with diabetic neuropathy, unspecified: Secondary | ICD-10-CM | POA: Diagnosis not present

## 2024-11-19 DIAGNOSIS — I10 Essential (primary) hypertension: Secondary | ICD-10-CM | POA: Diagnosis not present

## 2024-11-19 DIAGNOSIS — E1140 Type 2 diabetes mellitus with diabetic neuropathy, unspecified: Secondary | ICD-10-CM | POA: Diagnosis not present

## 2024-12-25 ENCOUNTER — Ambulatory Visit: Admitting: Podiatry

## 2024-12-28 ENCOUNTER — Ambulatory Visit: Admitting: Podiatry

## 2024-12-31 ENCOUNTER — Ambulatory Visit (INDEPENDENT_AMBULATORY_CARE_PROVIDER_SITE_OTHER): Admitting: Otolaryngology

## 2025-01-05 ENCOUNTER — Ambulatory Visit: Admitting: Podiatry

## 2025-01-13 ENCOUNTER — Ambulatory Visit (INDEPENDENT_AMBULATORY_CARE_PROVIDER_SITE_OTHER): Admitting: Otolaryngology

## 2025-01-27 ENCOUNTER — Ambulatory Visit: Admitting: Podiatry

## 2025-03-15 ENCOUNTER — Ambulatory Visit: Admitting: Podiatry
